# Patient Record
Sex: Male | Born: 1940
Health system: Southern US, Community
[De-identification: ages and names within clinical notes are randomized; demographics above are authoritative.]

## PROBLEM LIST (undated history)

## (undated) DIAGNOSIS — H269 Unspecified cataract: Secondary | ICD-10-CM

## (undated) DIAGNOSIS — K219 Gastro-esophageal reflux disease without esophagitis: Secondary | ICD-10-CM

## (undated) DIAGNOSIS — E538 Deficiency of other specified B group vitamins: Secondary | ICD-10-CM

## (undated) DIAGNOSIS — E785 Hyperlipidemia, unspecified: Secondary | ICD-10-CM

## (undated) DIAGNOSIS — R079 Chest pain, unspecified: Secondary | ICD-10-CM

## (undated) DIAGNOSIS — M1990 Unspecified osteoarthritis, unspecified site: Secondary | ICD-10-CM

## (undated) DIAGNOSIS — E7211 Homocystinuria: Secondary | ICD-10-CM

## (undated) DIAGNOSIS — D126 Benign neoplasm of colon, unspecified: Secondary | ICD-10-CM

## (undated) DIAGNOSIS — D649 Anemia, unspecified: Secondary | ICD-10-CM

## (undated) DIAGNOSIS — R7989 Other specified abnormal findings of blood chemistry: Secondary | ICD-10-CM

## (undated) DIAGNOSIS — K5790 Diverticulosis of intestine, part unspecified, without perforation or abscess without bleeding: Secondary | ICD-10-CM

## (undated) DIAGNOSIS — N402 Nodular prostate without lower urinary tract symptoms: Secondary | ICD-10-CM

## (undated) DIAGNOSIS — K2270 Barrett's esophagus without dysplasia: Secondary | ICD-10-CM

## (undated) DIAGNOSIS — R972 Elevated prostate specific antigen [PSA]: Secondary | ICD-10-CM

## (undated) DIAGNOSIS — F419 Anxiety disorder, unspecified: Secondary | ICD-10-CM

## (undated) DIAGNOSIS — R51 Headache: Secondary | ICD-10-CM

## (undated) DIAGNOSIS — Z87442 Personal history of urinary calculi: Secondary | ICD-10-CM

## (undated) DIAGNOSIS — R42 Dizziness and giddiness: Secondary | ICD-10-CM

## (undated) HISTORY — PX: COLONOSCOPY W/ POLYPECTOMY: SHX1380

## (undated) HISTORY — DX: Unspecified osteoarthritis, unspecified site: M19.90

## (undated) HISTORY — DX: Deficiency of other specified B group vitamins: E53.8

## (undated) HISTORY — DX: Barrett's esophagus without dysplasia: K22.70

## (undated) HISTORY — PX: CATARACT EXTRACTION, BILATERAL: SHX1313

## (undated) HISTORY — DX: Anxiety disorder, unspecified: F41.9

## (undated) HISTORY — DX: Hyperlipidemia, unspecified: E78.5

## (undated) HISTORY — DX: Personal history of urinary calculi: Z87.442

## (undated) HISTORY — DX: Nodular prostate without lower urinary tract symptoms: N40.2

## (undated) HISTORY — DX: Headache: R51

## (undated) HISTORY — DX: Diverticulosis of intestine, part unspecified, without perforation or abscess without bleeding: K57.90

## (undated) HISTORY — DX: Chest pain, unspecified: R07.9

## (undated) HISTORY — DX: Homocystinuria: E72.11

## (undated) HISTORY — DX: Unspecified cataract: H26.9

## (undated) HISTORY — DX: Benign neoplasm of colon, unspecified: D12.6

## (undated) HISTORY — DX: Elevated prostate specific antigen (PSA): R97.20

## (undated) HISTORY — DX: Gastro-esophageal reflux disease without esophagitis: K21.9

## (undated) HISTORY — DX: Other specified abnormal findings of blood chemistry: R79.89

## (undated) HISTORY — DX: Anemia, unspecified: D64.9

## (undated) HISTORY — DX: Dizziness and giddiness: R42

---

## 1998-06-10 ENCOUNTER — Emergency Department (HOSPITAL_COMMUNITY): Admission: EM | Admit: 1998-06-10 | Discharge: 1998-06-10 | Payer: Self-pay | Admitting: Emergency Medicine

## 1998-07-17 DIAGNOSIS — N402 Nodular prostate without lower urinary tract symptoms: Secondary | ICD-10-CM

## 1998-07-17 HISTORY — DX: Nodular prostate without lower urinary tract symptoms: N40.2

## 1998-07-17 HISTORY — PX: PROSTATE BIOPSY: SHX241

## 2002-11-22 ENCOUNTER — Encounter: Payer: Self-pay | Admitting: Emergency Medicine

## 2002-11-22 ENCOUNTER — Emergency Department (HOSPITAL_COMMUNITY): Admission: EM | Admit: 2002-11-22 | Discharge: 2002-11-22 | Payer: Self-pay | Admitting: Emergency Medicine

## 2003-12-23 ENCOUNTER — Encounter: Payer: Self-pay | Admitting: Internal Medicine

## 2004-04-05 ENCOUNTER — Encounter: Admission: RE | Admit: 2004-04-05 | Discharge: 2004-04-05 | Payer: Self-pay | Admitting: Internal Medicine

## 2004-07-17 HISTORY — PX: OTHER SURGICAL HISTORY: SHX169

## 2004-07-27 ENCOUNTER — Ambulatory Visit (HOSPITAL_BASED_OUTPATIENT_CLINIC_OR_DEPARTMENT_OTHER): Admission: RE | Admit: 2004-07-27 | Discharge: 2004-07-27 | Payer: Self-pay | Admitting: Surgery

## 2004-10-22 ENCOUNTER — Emergency Department (HOSPITAL_COMMUNITY): Admission: EM | Admit: 2004-10-22 | Discharge: 2004-10-22 | Payer: Self-pay | Admitting: Emergency Medicine

## 2004-12-20 ENCOUNTER — Ambulatory Visit: Payer: Self-pay | Admitting: Internal Medicine

## 2005-04-28 ENCOUNTER — Observation Stay (HOSPITAL_COMMUNITY): Admission: EM | Admit: 2005-04-28 | Discharge: 2005-04-29 | Payer: Self-pay | Admitting: Emergency Medicine

## 2005-04-29 ENCOUNTER — Ambulatory Visit: Payer: Self-pay | Admitting: Internal Medicine

## 2005-05-03 ENCOUNTER — Ambulatory Visit: Payer: Self-pay | Admitting: Internal Medicine

## 2005-05-17 ENCOUNTER — Ambulatory Visit: Payer: Self-pay | Admitting: Internal Medicine

## 2005-07-17 ENCOUNTER — Emergency Department (HOSPITAL_COMMUNITY): Admission: EM | Admit: 2005-07-17 | Discharge: 2005-07-17 | Payer: Self-pay | Admitting: Emergency Medicine

## 2005-07-17 HISTORY — PX: ROTATOR CUFF REPAIR: SHX139

## 2005-08-22 ENCOUNTER — Ambulatory Visit (HOSPITAL_COMMUNITY): Admission: RE | Admit: 2005-08-22 | Discharge: 2005-08-22 | Payer: Self-pay | Admitting: Specialist

## 2006-05-22 ENCOUNTER — Ambulatory Visit: Payer: Self-pay | Admitting: Internal Medicine

## 2006-05-22 LAB — CONVERTED CEMR LAB
ALT: 17 units/L (ref 0–40)
AST: 24 units/L (ref 0–37)
Albumin: 4.2 g/dL (ref 3.5–5.2)
Alkaline Phosphatase: 73 units/L (ref 39–117)
BUN: 11 mg/dL (ref 6–23)
Basophils Absolute: 0 10*3/uL (ref 0.0–0.1)
Basophils Relative: 0.6 % (ref 0.0–1.0)
CO2: 31 meq/L (ref 19–32)
Calcium: 9.5 mg/dL (ref 8.4–10.5)
Chloride: 106 meq/L (ref 96–112)
Chol/HDL Ratio, serum: 5.1
Cholesterol: 228 mg/dL (ref 0–200)
Creatinine, Ser: 1 mg/dL (ref 0.4–1.5)
Eosinophil percent: 3.6 % (ref 0.0–5.0)
GFR calc non Af Amer: 80 mL/min
Glomerular Filtration Rate, Af Am: 97 mL/min/{1.73_m2}
Glucose, Bld: 92 mg/dL (ref 70–99)
HCT: 44.1 % (ref 39.0–52.0)
HDL: 44.3 mg/dL (ref >39.0)
Hemoglobin: 14.9 g/dL (ref 13.0–17.0)
LDL DIRECT: 164.4 mg/dL
Lymphocytes Relative: 23.8 % (ref 12.0–46.0)
MCHC: 33.9 g/dL (ref 30.0–36.0)
MCV: 90.6 fL (ref 78.0–100.0)
Monocytes Absolute: 0.4 10*3/uL (ref 0.2–0.7)
Monocytes Relative: 8.8 % (ref 3.0–11.0)
Neutro Abs: 3 10*3/uL (ref 1.4–7.7)
Neutrophils Relative %: 63.2 % (ref 43.0–77.0)
PSA: 0.49 ng/mL (ref 0.10–4.00)
Platelets: 230 10*3/uL (ref 150–400)
Potassium: 4.2 meq/L (ref 3.5–5.1)
RBC: 4.87 M/uL (ref 4.22–5.81)
RDW: 12.4 % (ref 11.5–14.6)
Sodium: 142 meq/L (ref 135–145)
TSH: 1.04 microintl units/mL (ref 0.35–5.50)
Total Bilirubin: 1 mg/dL (ref 0.3–1.2)
Total Protein: 7.3 g/dL (ref 6.0–8.3)
Triglyceride fasting, serum: 95 mg/dL (ref 0–149)
VLDL: 19 mg/dL (ref 0–40)
WBC: 4.7 10*3/uL (ref 4.5–10.5)

## 2006-06-13 ENCOUNTER — Ambulatory Visit: Payer: Self-pay | Admitting: Gastroenterology

## 2006-06-16 DIAGNOSIS — D126 Benign neoplasm of colon, unspecified: Secondary | ICD-10-CM

## 2006-06-16 HISTORY — DX: Benign neoplasm of colon, unspecified: D12.6

## 2006-06-27 ENCOUNTER — Encounter (INDEPENDENT_AMBULATORY_CARE_PROVIDER_SITE_OTHER): Payer: Self-pay | Admitting: *Deleted

## 2006-06-27 ENCOUNTER — Encounter: Payer: Self-pay | Admitting: Internal Medicine

## 2006-06-27 ENCOUNTER — Ambulatory Visit: Payer: Self-pay | Admitting: Gastroenterology

## 2006-08-17 ENCOUNTER — Ambulatory Visit: Payer: Self-pay | Admitting: Internal Medicine

## 2006-08-17 LAB — CONVERTED CEMR LAB
ALT: 16 units/L (ref 0–40)
AST: 21 units/L (ref 0–37)
Cholesterol: 154 mg/dL (ref 0–200)
HDL: 45 mg/dL (ref >39.0)
LDL Cholesterol: 93 mg/dL (ref 0–99)
Total CHOL/HDL Ratio: 3.4
Triglycerides: 80 mg/dL (ref 0–149)
VLDL: 16 mg/dL (ref 0–40)

## 2007-01-14 ENCOUNTER — Ambulatory Visit: Payer: Self-pay | Admitting: Family Medicine

## 2007-01-14 ENCOUNTER — Telehealth (INDEPENDENT_AMBULATORY_CARE_PROVIDER_SITE_OTHER): Payer: Self-pay | Admitting: *Deleted

## 2007-01-15 LAB — CONVERTED CEMR LAB
Basophils Absolute: 0 10*3/uL (ref 0.0–0.1)
Basophils Relative: 0.6 % (ref 0.0–1.0)
Eosinophils Absolute: 0 10*3/uL (ref 0.0–0.6)
Eosinophils Relative: 0.4 % (ref 0.0–5.0)
HCT: 42.3 % (ref 39.0–52.0)
Hemoglobin: 14.3 g/dL (ref 13.0–17.0)
Lymphocytes Relative: 9.8 % — ABNORMAL LOW (ref 12.0–46.0)
MCHC: 33.9 g/dL (ref 30.0–36.0)
MCV: 90 fL (ref 78.0–100.0)
Monocytes Absolute: 0.3 10*3/uL (ref 0.2–0.7)
Monocytes Relative: 5.2 % (ref 3.0–11.0)
Neutro Abs: 5.6 10*3/uL (ref 1.4–7.7)
Neutrophils Relative %: 84 % — ABNORMAL HIGH (ref 43.0–77.0)
Platelets: 217 10*3/uL (ref 150–400)
RBC: 4.7 M/uL (ref 4.22–5.81)
RDW: 12.5 % (ref 11.5–14.6)
WBC: 6.5 10*3/uL (ref 4.5–10.5)

## 2007-05-27 ENCOUNTER — Ambulatory Visit: Payer: Self-pay | Admitting: Internal Medicine

## 2007-05-27 DIAGNOSIS — D126 Benign neoplasm of colon, unspecified: Secondary | ICD-10-CM

## 2007-05-27 DIAGNOSIS — E7210 Disorders of sulfur-bearing amino-acid metabolism, unspecified: Secondary | ICD-10-CM | POA: Insufficient documentation

## 2007-05-27 DIAGNOSIS — Z862 Personal history of diseases of the blood and blood-forming organs and certain disorders involving the immune mechanism: Secondary | ICD-10-CM

## 2007-06-05 ENCOUNTER — Encounter (INDEPENDENT_AMBULATORY_CARE_PROVIDER_SITE_OTHER): Payer: Self-pay | Admitting: *Deleted

## 2007-06-05 LAB — CONVERTED CEMR LAB
ALT: 17 units/L (ref 0–53)
AST: 27 units/L (ref 0–37)
Albumin: 4.1 g/dL (ref 3.5–5.2)
Alkaline Phosphatase: 68 units/L (ref 39–117)
BUN: 11 mg/dL (ref 6–23)
Basophils Absolute: 0.1 10*3/uL (ref 0.0–0.1)
Basophils Relative: 1.4 % — ABNORMAL HIGH (ref 0.0–1.0)
Bilirubin, Direct: 0.1 mg/dL (ref 0.0–0.3)
CO2: 28 meq/L (ref 19–32)
Calcium: 9.1 mg/dL (ref 8.4–10.5)
Chloride: 107 meq/L (ref 96–112)
Cholesterol: 191 mg/dL (ref 0–200)
Creatinine, Ser: 0.9 mg/dL (ref 0.4–1.5)
Eosinophils Absolute: 0.2 10*3/uL (ref 0.0–0.6)
Eosinophils Relative: 4.1 % (ref 0.0–5.0)
Folate: 19.2 ng/mL
GFR calc Af Amer: 109 mL/min
GFR calc non Af Amer: 90 mL/min
Glucose, Bld: 91 mg/dL (ref 70–99)
HCT: 40.4 % (ref 39.0–52.0)
HDL: 41 mg/dL (ref >39.0)
Hemoglobin: 14 g/dL (ref 13.0–17.0)
LDL Cholesterol: 130 mg/dL — ABNORMAL HIGH (ref 0–99)
Lymphocytes Relative: 24.6 % (ref 12.0–46.0)
MCHC: 34.6 g/dL (ref 30.0–36.0)
MCV: 91.1 fL (ref 78.0–100.0)
Monocytes Absolute: 0.3 10*3/uL (ref 0.2–0.7)
Monocytes Relative: 6.7 % (ref 3.0–11.0)
Neutro Abs: 2.8 10*3/uL (ref 1.4–7.7)
Neutrophils Relative %: 63.2 % (ref 43.0–77.0)
PSA: 0.43 ng/mL (ref 0.10–4.00)
Platelets: 233 10*3/uL (ref 150–400)
Potassium: 3.9 meq/L (ref 3.5–5.1)
RBC: 4.44 M/uL (ref 4.22–5.81)
RDW: 12.9 % (ref 11.5–14.6)
Sodium: 142 meq/L (ref 135–145)
TSH: 1.26 microintl units/mL (ref 0.35–5.50)
Total Bilirubin: 1 mg/dL (ref 0.3–1.2)
Total CHOL/HDL Ratio: 4.7
Total Protein: 6.9 g/dL (ref 6.0–8.3)
Triglycerides: 101 mg/dL (ref 0–149)
VLDL: 20 mg/dL (ref 0–40)
Vitamin B-12: 752 pg/mL (ref 211–911)
WBC: 4.5 10*3/uL (ref 4.5–10.5)

## 2007-08-02 ENCOUNTER — Ambulatory Visit: Payer: Self-pay | Admitting: Internal Medicine

## 2007-12-23 ENCOUNTER — Ambulatory Visit: Payer: Self-pay | Admitting: Internal Medicine

## 2007-12-30 LAB — CONVERTED CEMR LAB
Cholesterol: 210 mg/dL (ref 0–200)
Direct LDL: 153.3 mg/dL
HDL: 37.5 mg/dL — ABNORMAL LOW (ref >39.0)
Total CHOL/HDL Ratio: 5.6
Triglycerides: 92 mg/dL (ref 0–149)
VLDL: 18 mg/dL (ref 0–40)

## 2008-01-10 ENCOUNTER — Ambulatory Visit: Payer: Self-pay | Admitting: Internal Medicine

## 2008-01-10 DIAGNOSIS — L609 Nail disorder, unspecified: Secondary | ICD-10-CM | POA: Insufficient documentation

## 2008-01-10 DIAGNOSIS — F411 Generalized anxiety disorder: Secondary | ICD-10-CM

## 2008-01-10 LAB — CONVERTED CEMR LAB
Cholesterol, target level: 200 mg/dL
HDL goal, serum: 40 mg/dL
LDL Goal: 125 mg/dL

## 2008-01-20 ENCOUNTER — Encounter (INDEPENDENT_AMBULATORY_CARE_PROVIDER_SITE_OTHER): Payer: Self-pay | Admitting: *Deleted

## 2008-03-25 ENCOUNTER — Ambulatory Visit: Payer: Self-pay | Admitting: Internal Medicine

## 2008-04-01 LAB — CONVERTED CEMR LAB
ALT: 24 units/L (ref 0–53)
AST: 26 units/L (ref 0–37)
Albumin: 4.1 g/dL (ref 3.5–5.2)
Alkaline Phosphatase: 66 units/L (ref 39–117)
Bilirubin, Direct: 0.1 mg/dL (ref 0.0–0.3)
Cholesterol: 146 mg/dL (ref 0–200)
HDL: 36.2 mg/dL — ABNORMAL LOW (ref >39.0)
Hgb A1c MFr Bld: 5.5 % (ref 4.6–6.0)
LDL Cholesterol: 88 mg/dL (ref 0–99)
Total Bilirubin: 1 mg/dL (ref 0.3–1.2)
Total CHOL/HDL Ratio: 4
Total Protein: 7.1 g/dL (ref 6.0–8.3)
Triglycerides: 107 mg/dL (ref 0–149)
VLDL: 21 mg/dL (ref 0–40)
Vitamin B-12: 514 pg/mL (ref 211–911)

## 2008-04-02 ENCOUNTER — Encounter (INDEPENDENT_AMBULATORY_CARE_PROVIDER_SITE_OTHER): Payer: Self-pay | Admitting: *Deleted

## 2008-10-15 ENCOUNTER — Telehealth (INDEPENDENT_AMBULATORY_CARE_PROVIDER_SITE_OTHER): Payer: Self-pay | Admitting: *Deleted

## 2008-10-30 ENCOUNTER — Ambulatory Visit: Payer: Self-pay | Admitting: Internal Medicine

## 2008-10-30 DIAGNOSIS — R269 Unspecified abnormalities of gait and mobility: Secondary | ICD-10-CM | POA: Insufficient documentation

## 2008-11-05 ENCOUNTER — Ambulatory Visit: Payer: Self-pay | Admitting: Internal Medicine

## 2008-11-07 LAB — CONVERTED CEMR LAB
ALT: 20 units/L (ref 0–53)
AST: 24 units/L (ref 0–37)
Albumin: 4.2 g/dL (ref 3.5–5.2)
Alkaline Phosphatase: 67 units/L (ref 39–117)
BUN: 11 mg/dL (ref 6–23)
Basophils Absolute: 0.1 10*3/uL (ref 0.0–0.1)
Basophils Relative: 1.2 % (ref 0.0–3.0)
Bilirubin, Direct: 0.1 mg/dL (ref 0.0–0.3)
CO2: 27 meq/L (ref 19–32)
Calcium: 9.3 mg/dL (ref 8.4–10.5)
Chloride: 109 meq/L (ref 96–112)
Cholesterol: 238 mg/dL — ABNORMAL HIGH (ref 0–200)
Creatinine, Ser: 0.9 mg/dL (ref 0.4–1.5)
Direct LDL: 157.8 mg/dL
Eosinophils Absolute: 0.2 10*3/uL (ref 0.0–0.7)
Eosinophils Relative: 4.7 % (ref 0.0–5.0)
Folate: 15.2 ng/mL
GFR calc non Af Amer: 89.37 mL/min (ref >60)
Glucose, Bld: 103 mg/dL — ABNORMAL HIGH (ref 70–99)
HCT: 42.4 % (ref 39.0–52.0)
HDL: 37.9 mg/dL — ABNORMAL LOW (ref >39.00)
Hemoglobin: 14.5 g/dL (ref 13.0–17.0)
Lymphocytes Relative: 25 % (ref 12.0–46.0)
Lymphs Abs: 1.2 10*3/uL (ref 0.7–4.0)
MCHC: 34.1 g/dL (ref 30.0–36.0)
MCV: 90.5 fL (ref 78.0–100.0)
Monocytes Absolute: 0.4 10*3/uL (ref 0.1–1.0)
Monocytes Relative: 7.8 % (ref 3.0–12.0)
Neutro Abs: 3.1 10*3/uL (ref 1.4–7.7)
Neutrophils Relative %: 61.3 % (ref 43.0–77.0)
PSA: 0.53 ng/mL (ref 0.10–4.00)
Platelets: 219 10*3/uL (ref 150.0–400.0)
Potassium: 4.1 meq/L (ref 3.5–5.1)
RBC: 4.69 M/uL (ref 4.22–5.81)
RDW: 12.2 % (ref 11.5–14.6)
Sodium: 141 meq/L (ref 135–145)
TSH: 0.77 microintl units/mL (ref 0.35–5.50)
Total Bilirubin: 0.8 mg/dL (ref 0.3–1.2)
Total CHOL/HDL Ratio: 6
Total Protein: 7.2 g/dL (ref 6.0–8.3)
Triglycerides: 115 mg/dL (ref 0.0–149.0)
VLDL: 23 mg/dL (ref 0.0–40.0)
Vitamin B-12: 414 pg/mL (ref 211–911)
WBC: 5 10*3/uL (ref 4.5–10.5)

## 2008-11-09 ENCOUNTER — Encounter (INDEPENDENT_AMBULATORY_CARE_PROVIDER_SITE_OTHER): Payer: Self-pay | Admitting: *Deleted

## 2009-02-10 ENCOUNTER — Ambulatory Visit: Payer: Self-pay | Admitting: Internal Medicine

## 2009-02-17 LAB — CONVERTED CEMR LAB
ALT: 19 units/L (ref 0–53)
AST: 26 units/L (ref 0–37)
Cholesterol: 170 mg/dL (ref 0–200)
HDL: 40.6 mg/dL (ref >39.00)
LDL Cholesterol: 110 mg/dL — ABNORMAL HIGH (ref 0–99)
Total CHOL/HDL Ratio: 4
Triglycerides: 97 mg/dL (ref 0.0–149.0)
VLDL: 19.4 mg/dL (ref 0.0–40.0)

## 2009-02-18 ENCOUNTER — Encounter (INDEPENDENT_AMBULATORY_CARE_PROVIDER_SITE_OTHER): Payer: Self-pay | Admitting: *Deleted

## 2009-03-16 ENCOUNTER — Telehealth (INDEPENDENT_AMBULATORY_CARE_PROVIDER_SITE_OTHER): Payer: Self-pay | Admitting: *Deleted

## 2009-03-18 ENCOUNTER — Ambulatory Visit: Payer: Self-pay | Admitting: Internal Medicine

## 2009-03-18 DIAGNOSIS — L259 Unspecified contact dermatitis, unspecified cause: Secondary | ICD-10-CM

## 2009-04-26 ENCOUNTER — Telehealth (INDEPENDENT_AMBULATORY_CARE_PROVIDER_SITE_OTHER): Payer: Self-pay | Admitting: *Deleted

## 2009-04-27 ENCOUNTER — Ambulatory Visit: Payer: Self-pay | Admitting: Internal Medicine

## 2009-04-29 ENCOUNTER — Encounter (INDEPENDENT_AMBULATORY_CARE_PROVIDER_SITE_OTHER): Payer: Self-pay | Admitting: *Deleted

## 2009-04-29 LAB — CONVERTED CEMR LAB: Hgb A1c MFr Bld: 5.6 % (ref 4.6–6.5)

## 2009-05-31 ENCOUNTER — Ambulatory Visit: Payer: Self-pay | Admitting: Internal Medicine

## 2009-05-31 DIAGNOSIS — R51 Headache: Secondary | ICD-10-CM | POA: Insufficient documentation

## 2009-05-31 DIAGNOSIS — R519 Headache, unspecified: Secondary | ICD-10-CM | POA: Insufficient documentation

## 2009-05-31 DIAGNOSIS — M5412 Radiculopathy, cervical region: Secondary | ICD-10-CM | POA: Insufficient documentation

## 2009-06-01 ENCOUNTER — Telehealth (INDEPENDENT_AMBULATORY_CARE_PROVIDER_SITE_OTHER): Payer: Self-pay | Admitting: *Deleted

## 2009-08-09 ENCOUNTER — Telehealth (INDEPENDENT_AMBULATORY_CARE_PROVIDER_SITE_OTHER): Payer: Self-pay | Admitting: *Deleted

## 2010-06-14 ENCOUNTER — Encounter: Payer: Self-pay | Admitting: Internal Medicine

## 2010-06-14 ENCOUNTER — Emergency Department (HOSPITAL_COMMUNITY)
Admission: EM | Admit: 2010-06-14 | Discharge: 2010-06-14 | Payer: Self-pay | Source: Home / Self Care | Admitting: Emergency Medicine

## 2010-06-22 ENCOUNTER — Ambulatory Visit: Payer: Self-pay | Admitting: Internal Medicine

## 2010-06-22 DIAGNOSIS — K219 Gastro-esophageal reflux disease without esophagitis: Secondary | ICD-10-CM | POA: Insufficient documentation

## 2010-06-22 DIAGNOSIS — E785 Hyperlipidemia, unspecified: Secondary | ICD-10-CM

## 2010-06-22 DIAGNOSIS — L0390 Cellulitis, unspecified: Secondary | ICD-10-CM

## 2010-06-22 DIAGNOSIS — L0291 Cutaneous abscess, unspecified: Secondary | ICD-10-CM | POA: Insufficient documentation

## 2010-06-22 LAB — CONVERTED CEMR LAB: LDL Goal: 130 mg/dL

## 2010-06-24 LAB — CONVERTED CEMR LAB
ALT: 17 units/L (ref 0–53)
AST: 22 units/L (ref 0–37)
Albumin: 4.4 g/dL (ref 3.5–5.2)
Alkaline Phosphatase: 79 units/L (ref 39–117)
BUN: 16 mg/dL (ref 6–23)
Basophils Absolute: 0.1 10*3/uL (ref 0.0–0.1)
Basophils Relative: 1.1 % (ref 0.0–3.0)
Bilirubin, Direct: 0.2 mg/dL (ref 0.0–0.3)
CO2: 27 meq/L (ref 19–32)
Calcium: 9.1 mg/dL (ref 8.4–10.5)
Chloride: 104 meq/L (ref 96–112)
Cholesterol: 238 mg/dL — ABNORMAL HIGH (ref 0–200)
Creatinine, Ser: 0.9 mg/dL (ref 0.4–1.5)
Direct LDL: 157.2 mg/dL
Eosinophils Absolute: 0.1 10*3/uL (ref 0.0–0.7)
Eosinophils Relative: 2.1 % (ref 0.0–5.0)
GFR calc non Af Amer: 95.01 mL/min (ref >60.00)
Glucose, Bld: 92 mg/dL (ref 70–99)
HCT: 41.3 % (ref 39.0–52.0)
HDL: 45.9 mg/dL (ref >39.00)
Hemoglobin: 14 g/dL (ref 13.0–17.0)
Lymphocytes Relative: 21.8 % (ref 12.0–46.0)
Lymphs Abs: 1.1 10*3/uL (ref 0.7–4.0)
MCHC: 34 g/dL (ref 30.0–36.0)
MCV: 91.2 fL (ref 78.0–100.0)
Monocytes Absolute: 0.3 10*3/uL (ref 0.1–1.0)
Monocytes Relative: 6.6 % (ref 3.0–12.0)
Neutro Abs: 3.4 10*3/uL (ref 1.4–7.7)
Neutrophils Relative %: 68.4 % (ref 43.0–77.0)
PSA: 0.52 ng/mL (ref 0.10–4.00)
Platelets: 232 10*3/uL (ref 150.0–400.0)
Potassium: 4 meq/L (ref 3.5–5.1)
RBC: 4.53 M/uL (ref 4.22–5.81)
RDW: 13.8 % (ref 11.5–14.6)
Sodium: 140 meq/L (ref 135–145)
TSH: 1.42 microintl units/mL (ref 0.35–5.50)
Total Bilirubin: 1.2 mg/dL (ref 0.3–1.2)
Total CHOL/HDL Ratio: 5
Total Protein: 7.1 g/dL (ref 6.0–8.3)
Triglycerides: 122 mg/dL (ref 0.0–149.0)
VLDL: 24.4 mg/dL (ref 0.0–40.0)
Vitamin B-12: 410 pg/mL (ref 211–911)
WBC: 5 10*3/uL (ref 4.5–10.5)

## 2010-06-27 ENCOUNTER — Ambulatory Visit: Payer: Self-pay | Admitting: Internal Medicine

## 2010-06-27 DIAGNOSIS — R1031 Right lower quadrant pain: Secondary | ICD-10-CM | POA: Insufficient documentation

## 2010-06-27 DIAGNOSIS — K429 Umbilical hernia without obstruction or gangrene: Secondary | ICD-10-CM | POA: Insufficient documentation

## 2010-06-27 LAB — CONVERTED CEMR LAB
Bilirubin Urine: NEGATIVE
Glucose, Urine, Semiquant: NEGATIVE
Ketones, urine, test strip: NEGATIVE
Nitrite: NEGATIVE
Protein, U semiquant: NEGATIVE
Specific Gravity, Urine: 1.01
Urobilinogen, UA: 0.2
WBC Urine, dipstick: NEGATIVE
pH: 6

## 2010-06-28 ENCOUNTER — Ambulatory Visit: Payer: Self-pay | Admitting: Internal Medicine

## 2010-06-28 ENCOUNTER — Encounter: Payer: Self-pay | Admitting: Internal Medicine

## 2010-08-16 NOTE — Progress Notes (Signed)
Summary: REFILL  Phone Note Refill Request Message from:  Fax from Pharmacy on [facility] [address] FAX [phone]  TRAMADOL HCL 50MG   Initial call taken by: Barb Merino,  August 09, 2009 9:37 AM    Prescriptions: TRAMADOL HCL 50 MG TABS (TRAMADOL HCL) q 6 hrs as needed #30(not allergic to codeine) **Darvocet off the market**  #30 x 1   Entered by:   Shonna Chock   Authorized by:   Marga Melnick MD   Signed by:   Shonna Chock on 08/09/2009   Method used:   Electronically to        [facility] [phone]* (retail)       [address]       [place], [place]  [address]       Ph: [phone]       Fax: [phone]   RxID:   [id number]

## 2010-08-16 NOTE — Assessment & Plan Note (Signed)
Summary: cpx patient fasting/nta   Vital Signs:  Patient profile:   70 year old male Height:      69 inches Weight:      203.6 pounds BMI:     30.18 Temp:     98.7 degrees F Pulse rate:   72 / minute Resp:     14 per minute BP sitting:   130 / 74  Primary Care Provider:  Laury Axon  CC:  Heartburn.  History of Present Illness:     Adrian Burke is here for a physical; he is still employed & does not use Medicare.He was seen @ [facility] 06/14/2010 for chest pain ; cardiac enzymes & EKG were normal. PPI was Rxed; no recurrence.  The patient  now denies sour taste in mouth, epigastric pain, chest pain, trouble swallowing, and weight loss.  The patient denies the following alarm features: melena, dysphagia, hematemesis, and vomiting.  Symptoms  11/29 occurred  with lying down  after eating late , 9:30- 10 pm.  Lipid Management History:      Positive NCEP/ATP III risk factors include male age 41 years old or older.  Negative NCEP/ATP III risk factors include non-diabetic, no family history for ischemic heart disease, non-tobacco-user status, non-hypertensive, no ASHD (atherosclerotic heart disease), no prior stroke/TIA, no peripheral vascular disease, and no history of aortic aneurysm.     Allergies: 1)  ! Ceftin 2)  ! * Smz/tmp  Past History:  Past Medical History: B 12 deficiency , on monthly injections ; spinal tap as OP for headaches ; chest pain 2006 & 2011(neg nuclear stess test 2004) , MI R/Oed; PMH prostate nodule 2000, resolved w/o treatment  ; elevated PSA 2000 due to prostatitis Hyperlipidemia:  NMR Lipoprofile 2005: LDL 425(9563/875), HDL 38, TG 87. LDL goal = < 130.[place] Study LDL goal = < 160. Elevated Homocysteine Colonic polyps, PMH  of, Tubular adenoma   Past Surgical History: Rectal fistula repair 2005 Rotator cuff repair 2007,Dr Paula Libra Colon polypectomy 06/2006, Dr Jarold Motto Prostate biopsy 2000, Dr Annabell Howells  Family History: Father: health history unknown Mother:  cancer  of ? primary Siblings: negative Maternal aunt: brain cancer; no FH of  MI ,DM or CVA  Social History: no diet Occupation: Social worker for [employer] Married Never Smoked Alcohol use-no Regular exercise: irregularly  Review of Systems  The patient denies anorexia, fever, vision loss, decreased hearing, hoarseness, syncope, dyspnea on exertion, prolonged cough, hemoptysis, hematuria, depression, unusual weight change, abnormal bleeding, enlarged lymph nodes, and angioedema.         Edema post prolonged  periods on feet  Physical Exam  General:  well-nourished,in no acute distress; alert,appropriate and cooperative throughout examination Head:  Normocephalic and atraumatic without obvious abnormalities.  Eyes:  No corneal or conjunctival inflammation noted. Perrla. Funduscopic exam benign, without hemorrhages, exudates or papilledema.No icterus Ears:  External ear exam shows no significant lesions or deformities.  Otoscopic examination reveals clear canals, tympanic membranes are intact bilaterally without bulging, retraction, inflammation or discharge. Hearing is grossly normal bilaterally. Nose:  External nasal examination shows no deformity or inflammation. Nasal mucosa are pink and moist without lesions or exudates. Mouth:  Oral mucosa and oropharynx without lesions or exudates.  Teeth in good repair. no pharyngeal erythema.   Neck:  No deformities, masses, or tenderness noted. Lungs:  Normal respiratory effort, chest expands symmetrically. Lungs are clear to auscultation, no crackles or wheezes. Heart:  Normal rate and regular rhythm. S1 and S2 normal without gallop, murmur,  click, rub . S4 Abdomen:  Bowel sounds positive,abdomen soft and non-tender without masses, organomegaly or hernias noted. Umbilicus mildly erythematous & slightly tender Rectal:  No external abnormalities noted. Normal sphincter tone. No rectal masses or tenderness. Genitalia:  Testes bilaterally  descended without nodularity, tenderness or masses. No scrotal masses or lesions. No penis lesions or urethral discharge. Prostate:  Prostate gland firm and smooth, no enlargement, nodularity, tenderness, mass, asymmetry or induration. Msk:  No deformity or scoliosis noted of thoracic or lumbar spine.   Pulses:  R and L carotid,radia,dorsalis pedis and posterior tibial pulses are full and equal bilaterally Extremities:  No clubbing, cyanosis, edema, or deformity noted with normal full range of motion of all joints.  Mild crepitus of knees Neurologic:  alert & oriented X3 and DTRs symmetrical and normal.   Skin:  Intact without suspicious lesions or rashes Cervical Nodes:  No lymphadenopathy noted Axillary Nodes:  No palpable lymphadenopathy Inguinal Nodes:  No significant adenopathy Psych:  memory intact for recent and remote, normally interactive, and good eye contact.     Impression & Recommendations:  Problem # 1:  ROUTINE GENERAL MEDICAL EXAM@HEALTH  CARE FACL (ICD-V70.0)  Orders: Venipuncture (16109) TLB-Lipid Panel (80061-LIPID) TLB-BMP (Basic Metabolic Panel-BMET) (80048-METABOL) TLB-CBC Platelet - w/Differential (85025-CBCD) TLB-Hepatic/Liver Function Pnl (80076-HEPATIC) TLB-TSH (Thyroid Stimulating Hormone) (84443-TSH) TLB-B12, Serum-Total ONLY (60454-U98) TLB-PSA (Prostate Specific Antigen) (84153-PSA)  Problem # 2:  GERD (ICD-530.81) with chest pain, resolved with PPI  Problem # 3:  CELLULITIS (ICD-682.9) @ umbilicus   Problem # 4:  HYPERLIPIDEMIA (ICD-272.4)  Problem # 5:  ADENOMATOUS COLONIC POLYP (ICD-211.3)  Orders: Gastroenterology Referral (GI)  Problem # 6:  ANEMIA, PERNICIOUS, HX OF (ICD-V12.3)  Complete Medication List: 1)  Folic Acid 1 Mg Tabs (Folic acid) .... Take one tablet daily 2)  Ecotrin 81mg  On Hold  3)  Tylenol Arthritis Pain 650 Mg Cr-tabs (Acetaminophen) .[name].. 1 by mouth once daily as needed 4)  Cyanocobalamin 1000 Mcg/ml Inj Soln  (Cyanocobalamin) .[name].. 1cc im q month 5)  Mucinex Prn  6)  Delsym Prn  7)  Multivitamin For Cholesterol  .... Once daily 8)  23 Gauge Syring For B12 Inj  .... As directed 9)  Fluoxetine Hcl 10 Mg Caps (Fluoxetine hcl) .[name].. 1 once daily 10)  Hydroxyzine Pamoate 25 Mg Caps (Hydroxyzine pamoate) .[name].. 1-2 q 6 hrs as needed 11)  Propoxyphene N-apap 100-650 Mg Tabs (Propoxyphene n-apap) .[name].. 1 q 6 hrs as needed pain 12)  Cyclobenzaprine Hcl 5 Mg Tabs (Cyclobenzaprine hcl) .[name].. 1-2 at bedtime as needed for muscle spasm 13)  Tramadol Hcl 50 Mg Tabs (Tramadol hcl) .... Q 6 hrs as needed #30(not allergic to codeine) **darvocet off the market** 14)  Bactroban 2 % Oint (Mupirocin) .... Apply once daily - two times a day after cleaning umbilicus  Lipid Assessment/Plan:      Based on NCEP/ATP III, the patient's risk factor category is "0-1 risk factors".  The patient's lipid goals are as follows: Total cholesterol goal is 200; LDL cholesterol goal is 130; HDL cholesterol goal is 40; Triglyceride goal is 150.  His LDL cholesterol goal has not been met.  Secondary causes for hyperlipidemia have been ruled out.  He has been counseled on adjunctive measures for lowering his cholesterol and has been provided with dietary instructions.    Patient Instructions: 1)  Consider Surgical referral if cellulitis fails to resolve with ointment . 2)  Avoid foods high in acid (tomatoes, citrus juices, spicy foods). Avoid eating within  two hours of lying down or before exercising. Do not over eat; try smaller more frequent meals. Elevate head of bed twelve inches when sleeping. Prescriptions: BACTROBAN 2 % OINT (MUPIROCIN) apply once daily - two times a day after cleaning umbilicus  #15 grams x 0   Entered and Authorized by:   Marga Melnick MD   Signed by:   Marga Melnick MD on 06/22/2010   Method used:   Print then Give to Patient   RxID:   [id number]    Orders Added: 1)  Est. Patient 65& > [99397] 2)   Venipuncture [14782] 3)  TLB-Lipid Panel [80061-LIPID] 4)  TLB-BMP (Basic Metabolic Panel-BMET) [80048-METABOL] 5)  TLB-CBC Platelet - w/Differential [85025-CBCD] 6)  TLB-Hepatic/Liver Function Pnl [80076-HEPATIC] 7)  TLB-TSH (Thyroid Stimulating Hormone) [84443-TSH] 8)  TLB-B12, Serum-Total ONLY [82607-B12] 9)  TLB-PSA (Prostate Specific Antigen) [95621-HYQ] 10)  Gastroenterology Referral [GI]     Appended Document: cpx patient fasting/nta

## 2010-08-18 NOTE — Assessment & Plan Note (Signed)
Summary: Sharpe pain below belly button/scm   Vital Signs:  Patient profile:   70 year old male Weight:      205.2 pounds BMI:     30.41 Temp:     98.2 degrees F oral Pulse rate:   60 / minute Resp:     16 per minute BP sitting:   122 / 80  (left arm) Cuff size:   large  Vitals Entered By: Shonna Chock CMA (June 27, 2010 4:44 PM) CC: Lower abdominal pain-worse on right side, very tender to the touch , Abdominal pain   Primary Care Provider:  Laury Axon  CC:  Lower abdominal pain-worse on right side, very tender to the touch , and Abdominal pain.  History of Present Illness: Abdominal Pain      This is a Adrian Burke who presents with Abdominal pain as of 12/11.  The patient denies nausea, vomiting, diarrhea, constipation, melena, hematochezia, anorexia, and hematemesis.  The location of the pain is right lower quadrant.  The pain is described as constant  and dull; it is  sharp with pressure to the umbilical area.It even affected his sleep if in RLDDP. He had been cleaning the umbilicus & applying Bactroban w/o pain until 12/11 for possible low grade cellulitis of umbilicus. The umbilicus changes responded with decreased extrusion  & less erythema until 12/11.  The patient denies the following symptoms: fever, weight loss, dysuria, jaundice, and dark urine.  No PMH of appendectomy.PMH of Nephrolithiasis.  Allergies: 1)  ! Ceftin 2)  ! * Smz/tmp  Review of Systems General:  Denies chills and sweats. GU:  Denies discharge and hematuria.  Physical Exam  General:  Uncomfortable but in no acute distress; alert,appropriate and cooperative throughout examination Eyes:  No corneal or conjunctival inflammation noted.No icterus Mouth:  Oral mucosa and oropharynx without lesions or exudates.  No pharyngeal erythema.   Lungs:  Normal respiratory effort, chest expands symmetrically. Lungs are clear to auscultation, no crackles or wheezes. Heart:  regular rhythm, no murmur, no gallop, no  rub, no JVD, no HJR, and bradycardia.   Abdomen:  Bowel sounds positive,abdomen soft  but slightly tender  RLQ without masses, organomegaly . Umbilical hernia suggested  w/o suggestion of cellulitis Skin:  Intact without suspicious lesions or rashes. No jaundice  Cervical Nodes:  No lymphadenopathy noted Axillary Nodes:  No palpable lymphadenopathy Psych:  memory intact for recent and remote, normally interactive, and good eye contact.     Impression & Recommendations:  Problem # 1:  ABDOMINAL PAIN, RIGHT LOWER QUADRANT (ICD-789.03)  Orders: Radiology Referral (Radiology)  Problem # 2:  UMBILICAL HERNIA (ICD-553.1)  R/O cellulitis , abscess  Orders: Radiology Referral (Radiology)  Complete Medication List: 1)  Folic Acid 1 Mg Tabs (Folic acid) .... Take one tablet daily 2)  Ecotrin 81mg  On Hold  3)  Tylenol Arthritis Pain 650 Mg Cr-tabs (Acetaminophen) .[name].. 1 by mouth once daily as needed 4)  Cyanocobalamin 1000 Mcg/ml Inj Soln (Cyanocobalamin) .[name].. 1cc im q month 5)  Mucinex Prn  6)  Delsym Prn  7)  Multivitamin For Cholesterol  .... Once daily 8)  23 Gauge Syring For B12 Inj  .... As directed 9)  Fluoxetine Hcl 10 Mg Caps (Fluoxetine hcl) .[name].. 1 once daily 10)  Hydroxyzine Pamoate 25 Mg Caps (Hydroxyzine pamoate) .[name].. 1-2 q 6 hrs as needed 11)  Propoxyphene N-apap 100-650 Mg Tabs (Propoxyphene n-apap) .[name].. 1 q 6 hrs as needed pain 12)  Cyclobenzaprine Hcl 5  Mg Tabs (Cyclobenzaprine hcl) .[name].. 1-2 at bedtime as needed for muscle spasm 13)  Tramadol Hcl 50 Mg Tabs (Tramadol hcl) .... Q 6 hrs as needed #30(not allergic to codeine) **darvocet off the market** 14)  Bactroban 2 % Oint (Mupirocin) .... Apply once daily - two times a day after cleaning umbilicus 15)  Acetaminophen-codeine #3 300-30 Mg Tabs (Acetaminophen-codeine) .[name].. 1-2 every 4 -6 hrs as needed  Other Orders: UA Dipstick w/o Micro (manual) (10272) Specimen Handling (99000) T-Culture, Urine  (53664-40347)  Patient Instructions: 1)  Drink clear liquids only for the next 24 hours, then slowly add other liquids and food as you  tolerate them. To ER if pain progresses with this note. Prescriptions: ACETAMINOPHEN-CODEINE #3 300-30 MG TABS (ACETAMINOPHEN-CODEINE) 1-2 every 4 -6 hrs as needed  #30 x 0   Entered and Authorized by:   Marga Melnick MD   Signed by:   Marga Melnick MD on 06/27/2010   Method used:   Print then Give to Patient   RxID:   [id number]    Orders Added: 1)  UA Dipstick w/o Micro (manual) [81002] 2)  Specimen Handling [99000] 3)  T-Culture, Urine [51884-16606] 4)  Est. Patient Level IV [30160] 5)  Radiology Referral [Radiology]    Laboratory Results   Urine Tests    Routine Urinalysis   Color: lt. yellow Appearance: Clear Glucose: negative   (Normal Range: Negative) Bilirubin: negative   (Normal Range: Negative) Ketone: negative   (Normal Range: Negative) Spec. Gravity: 1.010   (Normal Range: 1.003-1.035) Blood: trace-lysed   (Normal Range: Negative) pH: 6.0   (Normal Range: 5.0-8.0) Protein: negative   (Normal Range: Negative) Urobilinogen: 0.2   (Normal Range: 0-1) Nitrite: negative   (Normal Range: Negative) Leukocyte Esterace: negative   (Normal Range: Negative)

## 2010-08-29 ENCOUNTER — Encounter: Payer: Self-pay | Admitting: Internal Medicine

## 2010-09-07 NOTE — Letter (Signed)
Summary: Request for Surgical Clearance  Request for Surgical Clearance   Imported By: Maryln Gottron 08/31/2010 11:32:58  _____________________________________________________________________  External Attachment:    Type:   Image     Comment:   External Document

## 2010-09-19 ENCOUNTER — Other Ambulatory Visit: Payer: Self-pay | Admitting: Specialist

## 2010-09-19 ENCOUNTER — Encounter (HOSPITAL_COMMUNITY): Payer: Managed Care, Other (non HMO) | Attending: Specialist

## 2010-09-19 DIAGNOSIS — Z01812 Encounter for preprocedural laboratory examination: Secondary | ICD-10-CM | POA: Insufficient documentation

## 2010-09-19 LAB — SURGICAL PCR SCREEN
MRSA, PCR: NEGATIVE
Staphylococcus aureus: NEGATIVE

## 2010-09-22 ENCOUNTER — Ambulatory Visit (HOSPITAL_COMMUNITY)
Admission: RE | Admit: 2010-09-22 | Discharge: 2010-09-22 | Disposition: A | Discharge status: Home / Self Care | Payer: Managed Care, Other (non HMO) | Source: Ambulatory Visit | Attending: Specialist | Admitting: Specialist

## 2010-09-22 DIAGNOSIS — IMO0002 Reserved for concepts with insufficient information to code with codable children: Secondary | ICD-10-CM | POA: Insufficient documentation

## 2010-09-22 DIAGNOSIS — X58XXXA Exposure to other specified factors, initial encounter: Secondary | ICD-10-CM | POA: Insufficient documentation

## 2010-09-22 DIAGNOSIS — M9420 Chondromalacia, unspecified site: Secondary | ICD-10-CM | POA: Insufficient documentation

## 2010-09-22 DIAGNOSIS — S83289A Other tear of lateral meniscus, current injury, unspecified knee, initial encounter: Secondary | ICD-10-CM | POA: Insufficient documentation

## 2010-09-22 HISTORY — PX: KNEE ARTHROSCOPY: SUR90

## 2010-09-23 NOTE — Op Note (Signed)
  NAME:  Adrian Burke, Adrian Burke NO.:  [id number]  MEDICAL RECORD NO.:  [id number]           PATIENT TYPE:  O  LOCATION:  PADM                         FACILITY:  [facility]  PHYSICIAN:  Jene Every, M.D.    DATE OF BIRTH:  1941-03-11  DATE OF PROCEDURE:  09/22/2010 DATE OF DISCHARGE:                              OPERATIVE REPORT   PREOPERATIVE DIAGNOSIS:  Medial meniscus tear of the left knee.  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  PROCEDURES PERFORMED: 1. Left knee arthroscopy. 2. Partial medial meniscectomy. 3. Partial lateral meniscectomy. 4. Chondroplasty patellofemoral joint in the medial compartment.  BRIEF HISTORY:  A 70 year old male with knee pain, locking, giving way mechanical symptoms.  MRI indicating medial and lateral meniscus tears. Due to mechanical symptoms and persistent pain is indicated for arthroscopic debridement, partial meniscectomy.  Risks and benefits discussed including bleeding, infection, damage to bowel structures, no change in symptoms, worsening symptoms, need for repeat debridement, DVT, PE, anesthetic complications, etc.  TECHNIQUE:  With the patient in supine position after adequate anesthesia and 2 g Kefzol, left lower extremity was prepped and draped in the usual sterile fashion.  A lateral parapatellar portal and superomedial parapatellar portal was fashioned with a #11 blade. Ingress cannula atraumatically placed.  Irrigant was utilized to insufflate the joint.  Under direct visualization, medial parapatellar portal was fashioned with a #11 blade after localization with 18-gauge needle sparing the medial meniscus.  There was extensive complex tearing of the medial meniscus.  The meniscus was displaced into the tibial femoral compartment.  I introduced a basket and performed a partial medial meniscectomy to a stable base,  approximately 50% of posterior half had to be excised.  This is further contoured with a 0.352 shaver to a stable base.  A light chondroplasty of the femoral condyle and tibial plateau were performed as well.  ACL revealed some degenerative fraying, otherwise unremarkable.  Lateral compartment revealed extensive radial tearing and complex tearing under portion of the lateral meniscus.  It was fairly tight, obtaining access into the lateral compartment.  We debrided this to a stable base, approximately 20% of the anterior portion of the meniscus from the middle third was removed.  The remnant was stable to probe palpation.  Chondral surfaces were otherwise unremarkable there.  Suprapatellar pouch revealed normal patellofemoral tracking, chondromalacia patellofemoral joint.  Normal patellofemoral tracking. Synovectomy performed.  Gutters were unremarkable.  Reexamined all compartments.  No further pathology amenable arthroscopic intervention, I therefore removed all instrumentation and portals were closed with 4-0 nylon simple sutures.  Marcaine 0.25% with epinephrine was infiltrated in the joint.  Wound was dressed sterilely.  Awoken without difficulty and transported to the recovery room in satisfactory condition.  The patient tolerated the procedure well.  No complications.  Assistant none.     Jene Every, M.D.     Cordelia Pen  D:  09/22/2010  T:  09/23/2010  Job:  [id number]  Electronically Signed by Jene Every M.D. on 09/23/2010 05:50:20 AM

## 2010-09-27 LAB — POCT CARDIAC MARKERS
CKMB, poc: 1.2 ng/mL (ref 1.0–8.0)
Myoglobin, poc: 103 ng/mL (ref 12–200)
Troponin i, poc: <0.05 ng/mL (ref 0.00–0.09)

## 2010-10-05 ENCOUNTER — Encounter: Payer: Self-pay | Admitting: Internal Medicine

## 2010-10-05 ENCOUNTER — Ambulatory Visit (INDEPENDENT_AMBULATORY_CARE_PROVIDER_SITE_OTHER): Payer: Managed Care, Other (non HMO) | Admitting: Internal Medicine

## 2010-10-05 VITALS — BP 110/68 | HR 76 | Temp 98.6°F | Wt 202.0 lb

## 2010-10-05 DIAGNOSIS — K429 Umbilical hernia without obstruction or gangrene: Secondary | ICD-10-CM

## 2010-10-05 NOTE — Progress Notes (Signed)
  Subjective:    Patient ID: Adrian Burke, male    DOB: Oct 16, 1940, 70 y.o.   MRN: [id number]  HPI  He has had an exacerbation of pain related to his previously documented umbilical hernia as of 3/17/ 2012. This was in the context of exercises prescribed following arthroscopy of the left knee 09/22/2010. The pain is described as sharp in the right periumbilical area, worse with movement. The narcotic pain medication prescribed for the knee has helped this pain.      Review of Systems   He denies fever, chills, sweats, nausea, vomiting, constipation, diarrhea, rectal bleeding, or melena. He has lost approximately 8 Holmes in this perioperative period.   He denies dysuria, hematuria or pyuria.     Objective:   Physical Exam  He is in no physical distress. He has no scleral icterus or jaundice. Auscultation of the chest reveals normal breath sounds with no increased work of breathing. Heart rhythm is regular with no significant murmurs or gallops   Examination of the abdomen reveals normal bowel sounds. There is distention and mild color change at the umbilicus itself. He's slightly tender in the right  periumbilical area.        Assessment & Plan:   #1 umbilical hernia exacerbated by exercises prescribed following arthroscopy of the left knee. He does plan to have a similar procedure done on the right knee. If the  Hernia not addressed the same issue would be expected to recur. He can continue his narcotic pain medication for the pain but referral to a general surgeon is recommended to address this chronic recurrent problem.

## 2010-10-05 NOTE — Patient Instructions (Signed)
He should go to the emergency room if the pain becomes intractable. You should not  eat  anything in that situation.  If the surgery is done electively you should be able  to avoid complications associated with the hernia. Of major concern would be the complication of gangrene from torsion.

## 2010-11-02 ENCOUNTER — Other Ambulatory Visit: Payer: Self-pay | Admitting: Surgery

## 2010-11-02 ENCOUNTER — Encounter (HOSPITAL_COMMUNITY): Payer: Managed Care, Other (non HMO) | Attending: Surgery

## 2010-11-02 DIAGNOSIS — K436 Other and unspecified ventral hernia with obstruction, without gangrene: Secondary | ICD-10-CM | POA: Insufficient documentation

## 2010-11-02 DIAGNOSIS — Z01812 Encounter for preprocedural laboratory examination: Secondary | ICD-10-CM | POA: Insufficient documentation

## 2010-11-02 LAB — CBC
Hemoglobin: 14.2 g/dL (ref 13.0–17.0)
MCH: 29.5 pg (ref 26.0–34.0)
MCV: 90 fL (ref 78.0–100.0)
Platelets: 220 10*3/uL (ref 150–400)
RBC: 4.81 MIL/uL (ref 4.22–5.81)

## 2010-11-10 ENCOUNTER — Ambulatory Visit (HOSPITAL_COMMUNITY)
Admission: RE | Admit: 2010-11-10 | Discharge: 2010-11-10 | Disposition: A | Discharge status: Home / Self Care | Payer: Managed Care, Other (non HMO) | Source: Ambulatory Visit | Attending: Surgery | Admitting: Surgery

## 2010-11-10 DIAGNOSIS — K420 Umbilical hernia with obstruction, without gangrene: Secondary | ICD-10-CM | POA: Insufficient documentation

## 2010-11-10 HISTORY — PX: ABDOMINAL HERNIA REPAIR: SHX539

## 2010-11-14 NOTE — Op Note (Signed)
NAME:  Adrian Burke, Adrian Burke NO.:  [id number]  MEDICAL RECORD NO.:  [id number]           PATIENT TYPE:  O  LOCATION:  PADM                         [facility]ACILITY:  [facility]  PHYSICIAN:  Ardeth Sportsman, MD     DATE OF BIRTH:  1941/01/15  DATE OF PROCEDURE:  11/10/2010 DATE OF DISCHARGE:  11/02/2010                              OPERATIVE REPORT   PRIMARY CARE PHYSICIAN:  Adrian Burke. [place], MD,FACP,FCCP  ORTHOPEDIC SURGEON:  Jene Every, M.D.  SURGEON:  Ardeth Sportsman, MD  PREOPERATIVE DIAGNOSIS:  Incarcerated periumbilical ventral hernia.  POSTOPERATIVE DIAGNOSIS:  Incarcerated periumbilical ventral hernia.  PROCEDURE PERFORMED:  Laparoscopic reduction and repair of periumbilical ventral hernia with mesh.  ANESTHESIA: 1. General anesthesia. 2. Local anesthetic with field block.  SPECIMENS:  None.  DRAINS:  None.  ESTIMATED BLOOD LOSS:  Minimal.  COMPLICATIONS:  None.  INDICATION:  Adrian Burke is a 70 year old overweight male who has noticed some increasing pain and swelling at his belly button.  It has gradually increased in size.  The basic concern is of an incarcerated hernia, recommended surgery.  Anatomy and physiology of abdominal formation was discussed. Pathophysiology of herniation with its natural history and risks were discussed.  Options discussed.  Recommendations were made for reduction repair of hernia.  Given his abdominal girth, size, and activity level, I recommended doing diagnostic laparoscopy with underlay hernia repair. Risks, benefits, alternatives were discussed.  Questions answered and he agreed to proceed.  OPERATIVE FINDINGS:  He had a 2 cm fascial defect at the umbilicus incarcerated with preperitoneal fat.  He had moderate preperitoneal scalloping fat, but there is no evidence of any strangulation.  DESCRIPTION OF PROCEDURE:  Informed consent was confirmed.  The patient underwent general anesthesia without any difficulty.  He had  voided just prior to coming to the operating room.  He received IV clindamycin, gentamicin given his penicillin allergy.  He was positioned supine with arms tucked.  His abdomen was clipped, prepped, and draped in the sterile fashion.  Surgical time-out confirmed our plan.  I placed a #5 mm port in the left upper quadrant using optical entry technique with the patient in the steep reverse Trendelenburg with the left side up.  Entry was clean.  I induced carbon dioxide insufflation. Careful inspection revealed no injury.  Under direct visualization, I placed a 10 mm port in the left flank and a 5 mm port in the left lower quadrant.  I was able to see a fair amount of preperitoneal fat going up into hernia, I reduced it down and the preperitoneal fat, falciform ligament, and umbilical ligament were trimmed off to get a more flat abdominal wall surface. I measured that defect.  I chose a 12 cm in diameter dual-sided mesh (Parietex/Seprafilm) and placed it into the abdomen.  I secured it to the anterior abdominal wall using #1 Prolene interrupted stitches x8.  I used  the laparoscopic tacker to help tack the mesh edges around it. It ensured good hemostasis.  Inspection revealed no injury or other abnormalities.  I placed a 0 Vicryl stitch around the 10 mm port site  and tied that down.  I evacuated carbon dioxide from the port site.  The fascia was stitched down.  I closed the skin and placed sterile dressings.  The patient was extubated, returned to the recovery room in stable in condition.  I have discussed postoperative care with the patient in the office just prior to surgery.  I wrote down the instructions.  I have discussed it with his wife as well.     Ardeth Sportsman, MD     SCG/MEDQ  D:  11/10/2010  T:  11/11/2010  Job:  [id number]  cc:   Adrian Burke. Alwyn Ren, MD,FACP,FCCP [phone] [place] [address]  Electronically Signed by Karie Soda MD on 11/14/2010  01:05:58 PM

## 2010-11-29 NOTE — Assessment & Plan Note (Signed)
[facility]                                 ON-CALL NOTE   LUISFELIPE, ENGELSTAD                       MRN:          [id number]  DATE:01/13/2007                            DOB:          1941-05-20    PHONE NUMBER:  [phone]   Patient of Dr. Alwyn Ren.   I think I spoke to his wife, although she did not identify herself.  The  phone call came at 9:41 p.m. on January 13, 2007.   Mr. Cerrone was working in the garden, thinks he got bit by a spider on  the ankle yesterday.  He used some Cortisone cream and the redness kind  of went away.  Today he has had fever to 102-103, kind of achy  throughout his body.  He has taken these pain aids he has at work which  has just about 100 mg of Tylenol and aspirin and some caffeine in it and  has not done much for his fever, which is not surprising.  He has had no  breathing problems, no skin problems suggestive of infection.  No  urinary symptoms.  No significant GI symptoms.  They call for advice.   PLAN:  I told his wife that he should probably take some Tylenol and she  is going to give him an Extra Strength Tylenol to go on top of the pain  aids he is taking already.  If his temperature comes down and he feels a  little better, he can probably just wait and maybe seek treatment  tomorrow during regular office hours.  If his fever stays up and he  continues to feel poorly, they should probably bring him to the  emergency room.     Karie Schwalbe, MD  Electronically Signed    RIL/MedQ  DD: 01/13/2007  DT: 01/14/2007  Job #: [id number]   cc:   Titus Dubin. Alwyn Ren, MD,FACP,FCCP

## 2010-12-02 NOTE — H&P (Signed)
NAME:  Adrian Burke, DISE NO.:  [id number]   MEDICAL RECORD NO.:  [id number]          PATIENT TYPE:  INP   LOCATION:  4708                         FACILITY:  [facility]   PHYSICIAN:  Wanda Plump, MD [facility]    DATE OF BIRTH:  15-Feb-1941   DATE OF ADMISSION:  04/28/2005  DATE OF DISCHARGE:                                HISTORY & PHYSICAL   CHIEF COMPLAINT:  Chest pain.   PRIMARY CARE DOCTOR:  Okie Bogacz. Alwyn Ren, M.D. [facility].   HISTORY OF PRESENT ILLNESS:  Mr. Emberson is a 70 year old white male who came  to the emergency room complaining of chest pain.  The pain is located at the  left upper chest with some radiation to the left lateral chest and also to  the left supraclavicular area and left arm.  The pain is described as a  hurt and it definitely increased when he touches or when he takes a deep  breath.  There is no associated nausea, shortness of breath, or diaphoresis  and at the time of the examination the patient states that it still hurts a  little.  The worst the chest pain has been is 4/10.  The pain started last  night and he had some more before coming to the hospital.   PAST MEDICAL HISTORY:  1.  Status post rectal fistulectomy on January 2003.  2.  No other surgeries and denies any diabetes or hypertension.   FAMILY HISTORY:  Negative for coronary artery disease, strokes, or diabetes.   SOCIAL HISTORY:  Does not smoke and drink.   REVIEW OF SYSTEMS:  Denies any fever, cough, nausea, vomiting, or diarrhea.  He admits to some slight heartburn lately.  No lower extremity edema.  He  had some ill defined pain in the right lower quadrant yesterday but that is  largely resolved.  He also admits to some numbness on his hands bilaterally.   MEDICATIONS:  Aspirin, vitamins, Metamucil.   ALLERGIES:  1.  CEPHALEXIN.  2.  SULFA.   PHYSICAL EXAMINATION:  GENERAL:  The patient is alert, oriented, in no  apparent distress.  VITAL SIGNS:  Afebrile,  blood pressure 134/81, pulse 69, respiratory rate  18, O2 sat 98% on room air.  NECK:  Full range of motion and there is no subcutaneous crepitus.  LUNGS:  Clear to auscultation bilaterally.  CARDIOVASCULAR:  Regular rate and rhythm without any murmur.  CHEST WALL:  Quite tender to palpation at the area of the pain as described  above.  The area is free of redness, warmness, or a rush.  The pain extends  even to the lateral side of the chest.  ABDOMEN:  Soft, not distended.  It is slightly tender at the epigastrium.  At the time of my exam, the patient said, I just noticed the pain there  when you touch it.  EXTREMITIES:  No edema and the calves are symmetric.  Left shoulder is full  range of motion without any pain.   LABORATORY/X-RAY:  Chest x-ray shows minimal vascular atelectasis.  White  count is 5.3,  hemoglobin 13.6, platelets 236.  Potassium 3.8, creatinine  1.0, blood sugar 88.  LFTs are normal.  Lipase is 30 which is normal.  Point-  of-care cardiac enzymes x3 are negative.  EKG showed basically a normal  sinus rhythm without any evidence of ischemia.   ASSESSMENT/PLAN:  The patient is admitted with quite atypical chest pain.  His cardiovascular risk factors are positive for his age but he is a  nonsmoker, has no family history.  He does not have any hypertension  diabetes that we know of, and his cholesterol at this point is unknown.  My  plan is to check a D-dimmer and if it is positive we will pursue a CT of the  chest, otherwise we will keep him in telemetry, rule him out with serial  enzymes and EKGs.  I think that if he is doing well and all the tests are  negative, he will be okay to be discharged for outpatient stress test.      Wanda Plump, MD [facility]  Electronically Signed     JEP/MEDQ  D:  04/28/2005  T:  04/28/2005  Job:  [id number]   cc:   Titus Dubin. Alwyn Ren, M.D. [facility]  [phone]  [place] [address]

## 2010-12-02 NOTE — Letter (Signed)
May 29, 2006    Vania Rea. Jarold Motto, MD, FACG, FACP, FAGA  [address]  [place], [place] [address]   RE:  Adrian Burke, Adrian Burke  MRN:  [id number]  /  DOB:  Dec 06, 1940   Dear Adrian Burke,   Adrian Burke had a complete physical examination on May 22, 2006. He had  no significant complaints except for some minor arthritic changes. His  history is significant that he underwent rotator cuff surgery in January and  has been out of work from February to June; he continues the have decreased  range of motion and use of the right upper extremity.   He did have a fistulotomy in the anal region in January 2006. His last  colonoscopy apparently was in 1993 and was negative. He has no GI  complaints.   The only positive finding was prostatic hypertrophy and an early Dupuytren's  contracture of the right palm and mild crepitus in his knees.   CBC and DIF, CMET, TSH and PSA were normal. Total cholesterol was 228 with  an LDL of 164. Simvastatin 20 mg will be initiated with followup labs after  8-10 weeks while still on the medication.   Would you please have your office schedule a followup screening colonoscopy.  Apparently his last colonoscopy was done at another facility but he has  requested to see you.    Sincerely,      Adrian Burke. Alwyn Ren, MD,FACP,FCCP  Electronically Signed    WFH/MedQ  DD: 05/29/2006  DT: 05/29/2006  Job #: [id number]

## 2010-12-02 NOTE — Discharge Summary (Signed)
NAME:  Adrian Burke, Adrian Burke NO.:  [id number]   MEDICAL RECORD NO.:  [id number]          PATIENT TYPE:  INP   LOCATION:  4708                         FACILITY:  [facility]   PHYSICIAN:  Titus Dubin. Alwyn Ren, M.D. [facility] OF BIRTH:  07-25-1940   DATE OF ADMISSION:  04/28/2005  DATE OF DISCHARGE:  04/29/2005                                 DISCHARGE SUMMARY   [name] is a 70 year old white male admitted with atypical chest pain and  left upper chest numbness.  For details, please see the dictation [phone])  by Dr. Drue Novel.   He was placed on telemetry and had no dysrhythmias.  Vital signs were  stable, and serial cardiac enzymes were negative.  His numbness in the left  upper chest resolved with two Tylenol on a q.i.d. prn basis.   On the morning of discharge, he was asymptomatic.  He stated that he had had  a stress test 2-3 years prior.   He will be discharged for outpatient followup within one week.  A fasting  [employer] NMR profile will be performed along with homocysteine level,  glucose, and C-reactive protein in an attempt to assess risk factors.  If  the stress test was more than two years prior, it will be repeated.   DISCHARGE STATUS:  Improved.  No change was made in his home medicines.  He  was asked to take Prilosec OTC each morning.   PROGNOSIS:  Depends on results of the laboratory studies and the potential  stress test.      Titus Dubin. Alwyn Ren, M.D. [facility]  Electronically Signed     WFH/MEDQ  D:  04/29/2005  T:  04/29/2005  Job:  [id number]

## 2010-12-02 NOTE — Op Note (Signed)
NAME:  Adrian Burke, Adrian Burke NO.:  [id number]   MEDICAL RECORD NO.:  [id number]          PATIENT TYPE:  AMB   LOCATION:  DSC                          FACILITY:  [facility]   PHYSICIAN:  Thornton Park. Daphine Deutscher, MD  DATE OF BIRTH:  1940/10/11   DATE OF PROCEDURE:  07/27/2004  DATE OF DISCHARGE:                                 OPERATIVE REPORT   PREOPERATIVE DIAGNOSIS:  Anorectal pain with probable fistula.   POSTOPERATIVE DIAGNOSIS:  Anorectal pain with probable fistula.   PROCEDURES:  1.  Examination under anesthesia.  2.  Anal fistulotomy for fistula at the 1-2 o'clock position with the      patient is in the dorsolithotomy position.  This is anterior left upper      quadrant.   SURGEON:  Thornton Park. Daphine Deutscher, M.D.   ANESTHESIA:  By LMA.   DESCRIPTION OF PROCEDURE:  Mr. Mcmartin was taken to room 8 at [facility] on July 27, 2004.  A digital exam was performed.  There was a  firm area at the 2 o'clock position.  I found a little opened area.  I  passed a probe and it passed easily into the anal canal.  It seemed to go  through the superficial subcutaneous sphincter, but did not really effect  much of the internal sphincter.  I went ahead and cut through this with a  knife and used electrocautery to cauterize the base.  Betadine ointment was  massaged into the area.  The patient will be given Lortab Plus to take for  pain.  He was instructed to take sitz baths multiple times a day and after  BMs.  He will be followed up in the office in two to three weeks.   FINAL DIAGNOSIS:  Anal fistula, status post anal fistulotomy.      Matt   MBM/MEDQ  D:  07/27/2004  T:  07/27/2004  Job:  [id number]   cc:   Oley Balm. Georgina Pillion, M.D.  [address]  [place]  [place] [address]  Fax: [phone]   Titus Dubin. Alwyn Ren, M.D. [facility]

## 2010-12-26 ENCOUNTER — Other Ambulatory Visit (HOSPITAL_COMMUNITY): Payer: Managed Care, Other (non HMO)

## 2011-01-02 ENCOUNTER — Ambulatory Visit (HOSPITAL_BASED_OUTPATIENT_CLINIC_OR_DEPARTMENT_OTHER)
Admission: RE | Admit: 2011-01-02 | Discharge: 2011-01-02 | Disposition: A | Discharge status: Home / Self Care | Payer: Managed Care, Other (non HMO) | Source: Ambulatory Visit | Attending: Specialist | Admitting: Specialist

## 2011-01-02 DIAGNOSIS — M23319 Other meniscus derangements, anterior horn of medial meniscus, unspecified knee: Secondary | ICD-10-CM | POA: Insufficient documentation

## 2011-01-02 DIAGNOSIS — Z01812 Encounter for preprocedural laboratory examination: Secondary | ICD-10-CM | POA: Insufficient documentation

## 2011-01-02 DIAGNOSIS — M2240 Chondromalacia patellae, unspecified knee: Secondary | ICD-10-CM | POA: Insufficient documentation

## 2011-01-02 HISTORY — PX: KNEE ARTHROSCOPY: SUR90

## 2011-01-02 LAB — POCT HEMOGLOBIN-HEMACUE: Hemoglobin: 14.1 g/dL (ref 13.0–17.0)

## 2011-01-06 NOTE — Op Note (Signed)
NAME:  Adrian Burke, Adrian Burke NO.:  [id number]  MEDICAL RECORD NO.:  [id number]  LOCATION:                                 FACILITY:  PHYSICIAN:  Jene Every, M.D.    DATE OF BIRTH:  [birth year]  DATE OF PROCEDURE:  01/02/2011 DATE OF DISCHARGE:                              OPERATIVE REPORT   PREOPERATIVE DIAGNOSIS:  Degenerative meniscal tear of the right knee.  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  PROCEDURE PERFORMED: 1. Right knee arthroscopy. 2. Partial medial meniscectomy. 3. Chondroplasty of the medial femoral condyle. 4. Chondroplasty of the patella. 5. Debridement of anterior horn of lateral meniscus.  HISTORY:  A 71 year old knee pain, medial joint line and joint space narrowing, locking, and giving way, indicated for arthroscopic debridement.  Risks and benefits discussed, including bleeding, infection, no change in the symptoms or worsening symptoms, need for repeat debridement, DVT, PE, anesthetic complication, need for total knee, etc.  TECHNIQUE:  With the patient in supine position after induction of adequate general anesthesia, 1 g of Kefzol, right lower extremity was prepped and draped in usual sterile fashion.  A lateral parapatellar portal and superomedial parapatellar portal was fashioned with a #11 blade.  Ingress cannula atraumatically placed.  Irrigant was utilized to insufflate joint.  Under direct visualization, medial parapatellar portal was fashioned with a #11 blade after localization with 18-gauge needle sparing the medial meniscus.  Noted was extensive tearing of the posterior half of the medial meniscus, extending into the joint.  There were some grade 4 changes of the posterior medial aspect of the tibial plateau and  grade 3 changes of the femoral condyle.  Basket rongeur was introduced and I performed a partial medial meniscectomy to the stable base, further contoured with a 3.5 Cuda shaver.  Light chondroplasty performed with the femoral condyle.  There was an extensive area of grade 4 change not amenable to abrasion arthroplasty.  The remnant of the meniscus was stable to probe palpation.  ACL was unremarkable as tearing the anterior horn of the lateral meniscus associated with 3.5 Cuda shaver.  The lateral compartment was fairly tight.  Evaluation of that and probing the posterior aspect revealed it was stable and minimal changes of the femoral condyle and tibial plateau.  Suprapatellar pouch revealed some grade 3 changes of the patella, normal patellofemoral tracking.  Gutters were unremarkable.  Grade 3 changes were noted.  Light chondroplasty performed here.  Knee was copiously lavaged and re-examined in all compartments.  No further pathology amenable arthroscopic intervention, therefore removed all instrumentation.  Portals were closed with 4-0 nylon simple sutures. Marcaine 0.25% with epinephrine was infiltrated into the joint.  The wound was dressed sterilely.  Awoken without difficulty and transported to Recovery in satisfactory condition.  The patient tolerated the procedure well.  No complications and no assistant.     Jene Every, M.D.     Cordelia Pen  D:  01/02/2011  T:  01/03/2011  Job:  [id number]  Electronically Signed by Jene Every  M.D. on 01/06/2011 02:49:46 PM

## 2011-01-11 ENCOUNTER — Other Ambulatory Visit: Payer: Self-pay | Admitting: Internal Medicine

## 2011-02-03 ENCOUNTER — Other Ambulatory Visit: Payer: Self-pay | Admitting: Specialist

## 2011-02-03 ENCOUNTER — Ambulatory Visit (HOSPITAL_COMMUNITY)
Admission: RE | Admit: 2011-02-03 | Discharge: 2011-02-03 | Disposition: A | Discharge status: Home / Self Care | Payer: Managed Care, Other (non HMO) | Source: Ambulatory Visit | Attending: Specialist | Admitting: Specialist

## 2011-02-03 ENCOUNTER — Encounter (HOSPITAL_COMMUNITY): Payer: Managed Care, Other (non HMO)

## 2011-02-03 ENCOUNTER — Other Ambulatory Visit (HOSPITAL_COMMUNITY): Payer: Self-pay | Admitting: Specialist

## 2011-02-03 DIAGNOSIS — Z01818 Encounter for other preprocedural examination: Secondary | ICD-10-CM

## 2011-02-03 DIAGNOSIS — M1710 Unilateral primary osteoarthritis, unspecified knee: Secondary | ICD-10-CM | POA: Insufficient documentation

## 2011-02-03 DIAGNOSIS — Z01812 Encounter for preprocedural laboratory examination: Secondary | ICD-10-CM | POA: Insufficient documentation

## 2011-02-03 LAB — COMPREHENSIVE METABOLIC PANEL
AST: 23 U/L (ref 0–37)
BUN: 14 mg/dL (ref 6–23)
CO2: 29 mEq/L (ref 19–32)
Calcium: 9.5 mg/dL (ref 8.4–10.5)
Chloride: 104 mEq/L (ref 96–112)
Creatinine, Ser: 0.75 mg/dL (ref 0.50–1.35)
GFR calc non Af Amer: >60 mL/min (ref >60)
Total Bilirubin: 0.5 mg/dL (ref 0.3–1.2)

## 2011-02-03 LAB — URINALYSIS, ROUTINE W REFLEX MICROSCOPIC
Bilirubin Urine: NEGATIVE
Ketones, ur: NEGATIVE mg/dL
Leukocytes, UA: NEGATIVE
Nitrite: NEGATIVE
Specific Gravity, Urine: 1.026 (ref 1.005–1.030)
Urobilinogen, UA: 0.2 mg/dL (ref 0.0–1.0)
pH: 7 (ref 5.0–8.0)

## 2011-02-03 LAB — DIFFERENTIAL
Lymphocytes Relative: 30 % (ref 12–46)
Lymphs Abs: 1.5 10*3/uL (ref 0.7–4.0)
Monocytes Relative: 9 % (ref 3–12)
Neutro Abs: 2.8 10*3/uL (ref 1.7–7.7)
Neutrophils Relative %: 56 % (ref 43–77)

## 2011-02-03 LAB — APTT: aPTT: 35 seconds (ref 24–37)

## 2011-02-03 LAB — PROTIME-INR: INR: 1.12 (ref 0.00–1.49)

## 2011-02-03 LAB — SURGICAL PCR SCREEN
MRSA, PCR: NEGATIVE
Staphylococcus aureus: NEGATIVE

## 2011-02-03 LAB — CBC
Platelets: 190 10*3/uL (ref 150–400)
RBC: 4.34 MIL/uL (ref 4.22–5.81)
RDW: 13.2 % (ref 11.5–15.5)
WBC: 4.9 10*3/uL (ref 4.0–10.5)

## 2011-02-10 ENCOUNTER — Inpatient Hospital Stay (HOSPITAL_COMMUNITY)
Admission: RE | Admit: 2011-02-10 | Discharge: 2011-02-13 | DRG: 470 | Disposition: A | Discharge status: Home Health | Payer: Managed Care, Other (non HMO) | Source: Ambulatory Visit | Attending: Specialist | Admitting: Specialist

## 2011-02-10 ENCOUNTER — Inpatient Hospital Stay (HOSPITAL_COMMUNITY): Payer: Managed Care, Other (non HMO)

## 2011-02-10 DIAGNOSIS — Z01812 Encounter for preprocedural laboratory examination: Secondary | ICD-10-CM

## 2011-02-10 DIAGNOSIS — M1710 Unilateral primary osteoarthritis, unspecified knee: Principal | ICD-10-CM | POA: Diagnosis present

## 2011-02-10 HISTORY — PX: REPLACEMENT TOTAL KNEE: SUR1224

## 2011-02-10 LAB — TYPE AND SCREEN: Antibody Screen: NEGATIVE

## 2011-02-11 LAB — CBC
HCT: 37.2 % — ABNORMAL LOW (ref 39.0–52.0)
MCH: 29.4 pg (ref 26.0–34.0)
MCV: 90.3 fL (ref 78.0–100.0)
Platelets: 218 10*3/uL (ref 150–400)
RBC: 4.12 MIL/uL — ABNORMAL LOW (ref 4.22–5.81)

## 2011-02-11 LAB — BASIC METABOLIC PANEL
BUN: 8 mg/dL (ref 6–23)
CO2: 28 mEq/L (ref 19–32)
Calcium: 9 mg/dL (ref 8.4–10.5)
Creatinine, Ser: 0.75 mg/dL (ref 0.50–1.35)
Glucose, Bld: 150 mg/dL — ABNORMAL HIGH (ref 70–99)

## 2011-02-12 LAB — CBC
HCT: 36.8 % — ABNORMAL LOW (ref 39.0–52.0)
MCH: 29.8 pg (ref 26.0–34.0)
MCHC: 33.2 g/dL (ref 30.0–36.0)
MCV: 89.8 fL (ref 78.0–100.0)
Platelets: 215 10*3/uL (ref 150–400)
RDW: 13.3 % (ref 11.5–15.5)

## 2011-02-12 LAB — BASIC METABOLIC PANEL
BUN: 7 mg/dL (ref 6–23)
Calcium: 8.9 mg/dL (ref 8.4–10.5)
Creatinine, Ser: 0.73 mg/dL (ref 0.50–1.35)
GFR calc Af Amer: >60 mL/min (ref >60)

## 2011-02-12 NOTE — Op Note (Signed)
NAME:  Adrian Burke, Adrian Burke NO.:  [id number]  MEDICAL RECORD NO.:  [id number]  LOCATION:  1616                         FACILITY:  [facility]  PHYSICIAN:  Jene Every, M.D.    DATE OF BIRTH:  January 23, 1941  DATE OF PROCEDURE: DATE OF DISCHARGE:                              OPERATIVE REPORT   PREOPERATIVE DIAGNOSIS:  Degenerative joint disease, right knee.  POSTOPERATIVE DIAGNOSIS:  Degenerative joint disease, right knee.  PROCEDURE PERFORMED:  Right total knee arthroplasty.  ANESTHESIA:  General.  ASSISTANT:  Roma Schanz, P.A.  COMPONENTS:  DePuy rotating platform, 4 femur, 5 tibia, 35 patella.  HISTORY:  A 70 year old, end-stage grade IV osteoarthritis of the knee, refractory, modification arthroscopic debridement, indicated for replacement of degenerative joint.  Risks and benefits were discussed including bleeding, infection, damage to neurovascular structures, no change in symptoms, worsening of symptoms, need for repeat debridement, component dislocation, DVT, PE, anesthetic complications, etc.  TECHNIQUE:  Patient in supine position.  After induction of adequate general anesthesia and 1 gram of vanco, the right lower extremity was prepped, draped and exsanguinated in usual sterile fashion.  Thigh tourniquet inflated to 300 mmHg.  A midline incision was made.  Full thickness flaps were developed.  Medial parapatellar arthrotomy was performed.  Patella everted, knee flexed.  Copious portion of clear synovial fluid evacuated.  Tricompartmental osteoarthrosis was noted taken to the medial compartment.  The soft tissues medially, preserving the collateral ligament.  ACL removed.  Remnants from the medial lateral menisci were removed.  Step drill was utilized to enter the femoral canal, irrigated, 5 degrees right place and 11 mm off the distal femur due to slight flexion contracture.  The 11 mm taken off the distal femur with an oscillating cut.  Soft tissues  were protected, we then sized the distal femur off the anterior cortex of the femur to a 4 and then 3 degrees of external rotation.  We then placed an anterior, posterior, and chamfer cuts.  Attention was then turned towards the tibia.  The tibia was subluxed cauterizing geniculate vessels.  The external alignment guide bisecting the ankle parallel to the tibia on the high side, which was laterally.  We pinned it and made our proximal tibial cut.  Following this, we checked flexion and extension gaps.  They were equivalent.  Attention was turned towards completing the tibia subluxed, talus utilized, soft tissue protected, maximal coverage of the thigh and medial aspect of tibial tubercle was essentially reamed, punch guide utilized.  Attention was turned towards the femur.  Distal femoral block applied bisecting the notch, oscillating saw was utilized to perform the distal femoral cut.  Then, put a trial femur and 10 mm insert, reduced it.  There was full extension and full flexion, good stability varus valgus stress to 0 and 30 degrees.  Attention turned towards the patella, was everted and measured to a 24, selected 15 utilizing a 35. We used an oscillating saw and planed the patella and we then drilled the peg holes utilizing the patella.  Trial patella was placed.  We then reduced it.  Good flexion and extension.  Good patellofemoral tracking. Next, all instrumentation was removed.  Checked posteriorly  any remnants of the menisci were removed.  Any vessels cauterized.  Copiously irrigated with pulsatile lavage and then flexed it.  Patella everted, dried all surfaces, mixed the cement on the back table and injected into the tibial canal under digital pressure, pressurized the cement, impacted the tibial tray.  Redundant cement removed, cemented the femur, the cement on the runners impacted and redundant cement removed.  The 10 mm insert reduced, axial load applied throughout the curing  of the cement.  Redundant cement removed.  Cemented the patella as well and after appropriate curing of the cement removed the trial after finding this was satisfactory spacer.  Redundant cement removed.  Copiously irrigated the joint.  Placed a 10-mm insert, reduced it, stable.  Full extension, full flexion, good stability varus valgus stressing from 0 to 30 degrees with excellent patellofemoral tracking.  Wound was copiously irrigated and injected Marcaine with epinephrine into the joint. Hemovac was placed and brought out through the lateral stab wound of the skin with the knee in slight flexion.  We reapproximated the patellar arthrotomy with the #1 Vicryl interrupted figure-of-8 sutures, subcu with 2-0 Vicryl simple sutures and skin was reapproximated with staples. Patient had flexion gravity against 90 degrees.  Good patellofemoral tracking.  No anterior drawer and no patellar clunk.  Sterile dressing applied.  Tourniquet was deflated and there was adequate revascularization of the lower extremity appreciated.  Patient tolerated the procedure well.  No complications.  TOURNIQUET TIME:  90 minutes.     Jene Every, M.D.     Cordelia Pen  D:  02/10/2011  T:  02/10/2011  Job:  [id number]  Electronically Signed by Jene Every M.D. on 02/12/2011 09:50:02 AM

## 2011-02-12 NOTE — H&P (Addendum)
NAME:  Adrian Burke, Adrian Burke NO.:  [id number]  MEDICAL RECORD NO.:  [id number]  LOCATION:                                 FACILITY:  PHYSICIAN:  Jene Every, M.D.    DATE OF BIRTH:  Sep 30, 1940  DATE OF ADMISSION: DATE OF DISCHARGE:                             HISTORY & PHYSICAL   DATE OF ADMISSION:  February 10, 2011  CHIEF COMPLAINT:  Right knee pain.  HISTORY:  Adrian Burke is a pleasant 70 year old gentleman who has noted several months onset of right knee pain, fairly disabling for him.  He actually underwent multiple injections without relief and eventually underwent right knee arthroscopy, which revealed grade 3-4 changes. Patient did note persistent symptoms.  It is felt at this time, he would benefit from a total knee arthroplasty.  The risks and benefits of the surgery were discussed with the patient.  He does elect to proceed.  PAST MEDICAL HISTORY:  Significant for: 1. GERD. 2. B12 deficiency.  CURRENT MEDICATIONS:  Include: 1. Folic acid. 2. Tylenol. 3. Aspirin 81 mg. 4. B12 injections. 5. Multivitamin. 6. Ginkgo biloba. 7. Vitamin D. 8. Magnesium. 9. Pantoprazole. 10.CoQ10. 11.Hydrocodone p.r.n. pain. 12.Motrin p.r.n.  ALLERGIES:  None.  PAST SURGICAL HISTORY:  Include: 1. Bilateral knee arthroscopy. 2. Hernia repair. 3. Right rotator cuff repair.  SOCIAL HISTORY:  Patient is married.  No history of tobacco or alcohol. Patient does continue to work.  He plans to go home following surgery. Dr. Alwyn Ren is his primary care physician.  FAMILY HISTORY:  Mother deceased in her 28s of cancer.  REVIEW OF SYSTEMS:  GENERAL:  Patient denies any fever, chills, night sweats, or bleeding tendencies.  Recently, he did have a root canal and was placed on amoxicillin, which should be "resolved" by the time of surgery.  CNS:  No blurred/double vision, seizure, headache, or paralysis.  RESPIRATORY:  No shortness of breath, productive cough,  or hemoptysis.  CARDIOVASCULAR:  No chest pain, angina, or orthopnea.  GU: No dysuria, hematuria, or discharge.  He got no nausea, vomiting, diarrhea, constipation, melena, bloody stools.  MUSCULOSKELETAL:  As per HPI.  PHYSICAL EXAMINATION:  VITAL SIGNS:  Pulse 60, respiratory rate 10, BP 110/80. GENERAL:  This is a healthy gentleman, seen upright, in no acute distress.  He does walk with a slightly antalgic gait. HEENT:  Atraumatic, normocephalic.  Pupils equal round and reactive. EOMs intact. NECK:  Supple.  No lymphadenopathy. CHEST:  Clear to auscultation bilaterally. HEART:  Regular rate and rhythm. ABDOMEN:  Soft, nontender, nondistended. SKIN:  No rashes or lesions are noted. EXTREMITIES:  In regards to the knee, he does have a healed arthroscopy incision.  Range of motion is 0 to 120.  No effusion.  Currently, nontender on the medial or lateral joint line.  There is mild crepitus on exam.  IMPRESSION:  Excision of joint disease, right knee.  PLAN:  The patient will be admitted to [facility] to undergo right total knee arthroplasty.     Roma Schanz, P.A.   ______________________________ Jene Every, M.D.    CS/MEDQ  D:  02/08/2011  T:  02/08/2011  Job:  [id number]  Electronically Signed  by Roma Schanz P.A. on 03/07/2011 09:18:10 AM Electronically Signed by Jene Every M.D. on 03/08/2011 10:22:32 AM

## 2011-02-13 LAB — CBC
MCHC: 32.6 g/dL (ref 30.0–36.0)
Platelets: 200 10*3/uL (ref 150–400)
RBC: 4.07 MIL/uL — ABNORMAL LOW (ref 4.22–5.81)
RDW: 13.3 % (ref 11.5–15.5)
WBC: 8.5 10*3/uL (ref 4.0–10.5)

## 2011-02-13 LAB — BASIC METABOLIC PANEL
Chloride: 100 mEq/L (ref 96–112)
Creatinine, Ser: 0.8 mg/dL (ref 0.50–1.35)
GFR calc Af Amer: >60 mL/min (ref >60)
GFR calc non Af Amer: >60 mL/min (ref >60)
Potassium: 3.7 mEq/L (ref 3.5–5.1)

## 2011-02-20 ENCOUNTER — Other Ambulatory Visit: Payer: Self-pay | Admitting: Internal Medicine

## 2011-03-14 NOTE — Discharge Summary (Signed)
NAME:  Adrian Burke, Adrian Burke NO.:  [id number]  MEDICAL RECORD NO.:  [id number]  LOCATION:  1616                         FACILITY:  [facility]  PHYSICIAN:  Jene Every, M.D.    DATE OF BIRTH:  06/30/1941  DATE OF ADMISSION:  02/10/2011 DATE OF DISCHARGE:  02/13/2011                              DISCHARGE SUMMARY   ADMISSION DIAGNOSES: 1. Degenerative disk disease, right knee. 2. History of gastroesophageal reflux disease. 3. Vitamin B12 deficiency.  DISCHARGE DIAGNOSES: 1. Degenerative disk disease, right knee. 2. History of gastroesophageal reflux disease. 3. Vitamin B12 deficiency. 4. Status post right total knee arthroplasty.  HISTORY:  Ms. Dittus is a pleasant 70 year old gentleman who is noted to have several month history of right knee pain, fairly disabling for him. He has undergone multiple injections as well as a knee arthroscopy with persistent symptoms, grade 3-4 changes were noted on the arthroscopy, so at this time, the patient would benefit from a total knee arthroplasty. The risks and benefits of the surgery were discussed with the patient. He does elect to proceed.  PROCEDURE:  The patient was taken to the OR, underwent right total knee arthroplasty.  SURGEON:  Jene Every, M.D.  ASSISTANT:  Roma Schanz, P.A.  ANESTHESIA:  General.  COMPLICATIONS:  None.  CONSULTS:  PT/OT Care Management.  LABORATORY DATA:  Postoperative CBC showed white cell count 11.3, hemoglobin 12.1, and hematocrit 37.2.  This was monitored throughout the hospital stay.  White cell count normalized at the time of discharge at 8.5, hemoglobin was stable at 12.0, and hematocrit 26.8.  Routine chemistries showed a sodium 136, potassium 3.8, with normal BUN and creatinine, and slightly elevated glucose.  These remained stable throughout the hospital course.  GFR is greater than 60.  Blood type is A positive.  No preoperative chest x-ray is noted in the  chart.  HOSPITAL COURSE:  The patient was admitted, taken to the OR, and underwent the above stated procedure without difficulty.  He was then transferred to the PACU and then to the orthopedic floor.  One Hemovac drain was placed intraoperatively.  The patient was placed on PCA for pain relief.  On postop day #1, the patient was doing well.  Vital signs were stable.  He was afebrile.  Hemoglobin was stable.  Dressing was clean, dry, and intact.  Hemovac was discontinued.  PT/OT was consulted. Postop day #2, the patient continued do fairly well.  Dressing was changed.  Incision was clean and dry.  Compartments soft.  Xarelto was started as per pharmacy for DVT prophylaxis.  Postop day #3, the patient was doing well, felt ready for discharge.  He was voiding and passing flatus without difficulty.  Slightly febrile at 99.0, asymptomatic. Hemoglobin stable at 12.0.  Incision remained clean and dry.  At this point, it was felt the patient was stable to be discharged home with home health physical therapy.  DISPOSITION:  He will follow up with Dr. Shelle Iron approximately in 10 to 14 days for staple removal and x-ray.  ACTIVITIES:  He is to ambulate as tolerated.  Continue with strict elevation, 6 times a day for 20 minutes at a time.  Continue to use  the knee immobilizer and do straight leg raise x10.  MEDICATIONS:  As per med rec.  He will need to be on Xarelto for a total of 21 days for DVT prophylaxis.  DIET:  As tolerated.  CONDITION ON DISCHARGE:  Stable.  FINAL DIAGNOSIS:  Doing well, status post right total knee arthroplasty.     Roma Schanz, P.A.   ______________________________ Jene Every, M.D.    CS/MEDQ  D:  03/07/2011  T:  03/08/2011  Job:  [id number]  Electronically Signed by Roma Schanz P.A. on 03/13/2011 09:41:24 AM Electronically Signed by Jene Every M.D. on 03/14/2011 03:56:16 PM

## 2011-04-25 ENCOUNTER — Telehealth: Payer: Self-pay

## 2011-04-25 MED ORDER — CYANOCOBALAMIN 1000 MCG/ML IJ SOLN: 1000.0000 ug | INTRAMUSCULAR | Status: DC

## 2011-04-25 NOTE — Telephone Encounter (Signed)
RX sent

## 2011-07-18 HISTORY — PX: UPPER GI ENDOSCOPY: SHX6162

## 2011-08-10 ENCOUNTER — Other Ambulatory Visit: Payer: Self-pay | Admitting: Internal Medicine

## 2011-09-13 ENCOUNTER — Encounter: Payer: Self-pay | Admitting: Gastroenterology

## 2011-12-19 ENCOUNTER — Encounter: Payer: Self-pay | Admitting: Gastroenterology

## 2012-01-08 ENCOUNTER — Ambulatory Visit (AMBULATORY_SURGERY_CENTER): Payer: Managed Care, Other (non HMO) | Admitting: *Deleted

## 2012-01-08 VITALS — Ht 70.0 in | Wt 198.0 lb

## 2012-01-08 DIAGNOSIS — Z1211 Encounter for screening for malignant neoplasm of colon: Secondary | ICD-10-CM

## 2012-01-08 MED ORDER — MOVIPREP 100 G PO SOLR: ORAL | Status: DC

## 2012-01-09 ENCOUNTER — Encounter: Payer: Self-pay | Admitting: Gastroenterology

## 2012-01-12 ENCOUNTER — Telehealth: Payer: Self-pay | Admitting: Gastroenterology

## 2012-01-12 NOTE — Telephone Encounter (Signed)
Informed wife that we don't routinely order antibiotics for a COLON; but if he needs it, please call his PCP or the orthopedic md; wife stated understanding.

## 2012-01-17 ENCOUNTER — Ambulatory Visit (AMBULATORY_SURGERY_CENTER): Payer: Managed Care, Other (non HMO) | Admitting: Gastroenterology

## 2012-01-17 ENCOUNTER — Telehealth: Payer: Self-pay | Admitting: Gastroenterology

## 2012-01-17 ENCOUNTER — Encounter: Payer: Self-pay | Admitting: Gastroenterology

## 2012-01-17 VITALS — BP 136/76 | HR 66 | Resp 20 | Ht 70.0 in | Wt 198.0 lb

## 2012-01-17 DIAGNOSIS — Z86010 Personal history of colonic polyps: Secondary | ICD-10-CM

## 2012-01-17 DIAGNOSIS — D129 Benign neoplasm of anus and anal canal: Secondary | ICD-10-CM

## 2012-01-17 DIAGNOSIS — D128 Benign neoplasm of rectum: Secondary | ICD-10-CM

## 2012-01-17 DIAGNOSIS — Z1211 Encounter for screening for malignant neoplasm of colon: Secondary | ICD-10-CM

## 2012-01-17 DIAGNOSIS — D126 Benign neoplasm of colon, unspecified: Secondary | ICD-10-CM

## 2012-01-17 MED ORDER — SODIUM CHLORIDE 0.9 % IV SOLN: 500.0000 mL | INTRAVENOUS | Status: DC

## 2012-01-17 NOTE — Op Note (Signed)
[facility] [address] [place], [place]  [address]  COLONOSCOPY PROCEDURE REPORT  PATIENT:  Adrian Burke, Adrian Burke  MR#:  [id number] BIRTHDATE:  1940/08/16, 70 yrs. old  GENDER:  male ENDOSCOPIST:  Vania Rea. Jarold Motto, MD, [facility] REF. BY: PROCEDURE DATE:  01/17/2012 PROCEDURE:  Colonoscopy with snare polypectomy ASA CLASS:  Class II INDICATIONS:  history of pre-cancerous (adenomatous) colon polyps  MEDICATIONS:   propofol (Diprivan) 150 mg IV  DESCRIPTION OF PROCEDURE:   After the risks and benefits and of the procedure were explained, informed consent was obtained. Digital rectal exam was performed and revealed no abnormalities. The LB [id number] endoscope was introduced through the anus and advanced to the cecum, which was identified by both the appendix and ileocecal valve.  The quality of the prep was excellent, using MoviPrep.  The instrument was then slowly withdrawn as the colon was fully examined. <<PROCEDUREIMAGES>>  FINDINGS:  Moderate diverticulosis was found in the sigmoid to descending colon segments.  A sessile polyp was found in the rectum. A RECTAL SESSILE POLYP COLD SNARE REMOVED.  This was otherwise a normal examination of the colon.   Retroflexed views in the rectum revealed no abnormalities.    The scope was then withdrawn from the patient and the procedure completed.  COMPLICATIONS:  None ENDOSCOPIC IMPRESSION: 1) Moderate diverticulosis in the sigmoid to descending colon segments 2) Sessile polyp in the rectum 3) Otherwise normal examination R/O ADENOMA RECOMMENDATIONS: 1) Await biopsy results 2) Repeat colonoscopy in 5 years if polyp adenomatous; otherwise 10 years 3) High fiber diet.  REPEAT EXAM:  No  ______________________________ Vania Rea. Jarold Motto, MD, [facility]  CC:  Pecola Lawless, MD  n. Rosalie DoctorMarland Kitchen   Vania Rea. Patterson at 01/17/2012 10:35 AM  Ihor Gully, [id number]

## 2012-01-17 NOTE — Telephone Encounter (Signed)
See previous phone note.  

## 2012-01-17 NOTE — Patient Instructions (Addendum)
Handouts were given to your care partner on diverticulosis, high fiber diet and polyps.  You may resume your prior medications today.  Please call if any questions or concerns.   YOU HAD AN ENDOSCOPIC PROCEDURE TODAY AT THE [facility]: Refer to the procedure report that was given to you for any specific questions about what was found during the examination.  If the procedure report does not answer your questions, please call your gastroenterologist to clarify.  If you requested that your care partner not be given the details of your procedure findings, then the procedure report has been included in a sealed envelope for you to review at your convenience later.  YOU SHOULD EXPECT: Some feelings of bloating in the abdomen. Passage of more gas than usual.  Walking can help get rid of the air that was put into your GI tract during the procedure and reduce the bloating. If you had a lower endoscopy (such as a colonoscopy or flexible sigmoidoscopy) you may notice spotting of blood in your stool or on the toilet paper. If you underwent a bowel prep for your procedure, then you may not have a normal bowel movement for a few days.  DIET: Your first meal following the procedure should be a light meal and then it is ok to progress to your normal diet.  A half-sandwich or bowl of soup is an example of a good first meal.  Heavy or fried foods are harder to digest and may make you feel nauseous or bloated.  Likewise meals heavy in dairy and vegetables can cause extra gas to form and this can also increase the bloating.  Drink plenty of fluids but you should avoid alcoholic beverages for 24 hours.  ACTIVITY: Your care partner should take you home directly after the procedure.  You should plan to take it easy, moving slowly for the rest of the day.  You can resume normal activity the day after the procedure however you should NOT DRIVE or use heavy machinery for 24 hours (because of the sedation medicines  used during the test).    SYMPTOMS TO REPORT IMMEDIATELY: A gastroenterologist can be reached at any hour.  During normal business hours, 8:30 AM to 5:00 PM Monday through Friday, call [phone].  After hours and on weekends, please call the GI answering service at [phone] who will take a message and have the physician on call contact you.   Following lower endoscopy (colonoscopy or flexible sigmoidoscopy):  Excessive amounts of blood in the stool  Significant tenderness or worsening of abdominal pains  Swelling of the abdomen that is new, acute  Fever of 100F or higher   FOLLOW UP: If any biopsies were taken you will be contacted by phone or by letter within the next 1-3 weeks.  Call your gastroenterologist if you have not heard about the biopsies in 3 weeks.  Our staff will call the home number listed on your records the next business day following your procedure to check on you and address any questions or concerns that you may have at that time regarding the information given to you following your procedure. This is a courtesy call and so if there is no answer at the home number and we have not heard from you through the emergency physician on call, we will assume that you have returned to your regular daily activities without incident.  SIGNATURES/CONFIDENTIALITY: You and/or your care partner have signed paperwork which will be entered into your electronic   medical record.  These signatures attest to the fact that that the information above on your After Visit Summary has been reviewed and is understood.  Full responsibility of the confidentiality of this discharge information lies with you and/or your care-partner.  

## 2012-01-17 NOTE — Progress Notes (Signed)
Patient did not have preoperative order for IV antibiotic SSI prophylaxis. (G8918)  Patient did not experience any of the following events: a burn prior to discharge; a fall within the facility; wrong site/side/patient/procedure/implant event; or a hospital transfer or hospital admission upon discharge from the facility. (G8907)  

## 2012-01-17 NOTE — Telephone Encounter (Signed)
Pt had a colonoscopy this morning with Dr. Jarold Motto, had a normal bowel movement but was flowing with blood and it took "quite a while to stop it" would like a call back from a nurse.

## 2012-01-17 NOTE — Telephone Encounter (Signed)
Pt states he had a bowel movement and passed a large amount of blood. No blood clots.  States it took about 20 minutes to get the bleeding to stop.  Has not seen any more blood since.  Feels fine.  Dr Jarold Motto on floor and spoke with him concerning patient.  Instructed for pt to stay on clear liquids today and no aspirin or aspirin products for two weeks.  If he has any more problems or has anymore bleeding to call the number given to him with discharge papers or he can go to emergency room.  Informed Adrian Burke of these instructions and he verbalizes understanding.

## 2012-01-17 NOTE — Telephone Encounter (Signed)
Adrian Burke, [place] 01/17/2012 3:21 PM Signed  Pt had a colonoscopy this morning with Dr. Jarold Motto, had a normal bowel movement but was flowing with blood and it took "quite a while to stop it" would like a call back from a nurse.

## 2012-01-17 NOTE — Progress Notes (Signed)
Propofol given and oxygen managed per D Merritt CRNA 

## 2012-01-17 NOTE — Progress Notes (Signed)
No complaints noted in the recovery room. Maw   

## 2012-01-19 ENCOUNTER — Telehealth: Payer: Self-pay | Admitting: *Deleted

## 2012-01-22 ENCOUNTER — Encounter: Payer: Self-pay | Admitting: Gastroenterology

## 2012-02-14 ENCOUNTER — Other Ambulatory Visit: Payer: Self-pay | Admitting: Internal Medicine

## 2012-03-01 ENCOUNTER — Encounter: Payer: Self-pay | Admitting: Internal Medicine

## 2012-03-01 ENCOUNTER — Ambulatory Visit (INDEPENDENT_AMBULATORY_CARE_PROVIDER_SITE_OTHER): Payer: Managed Care, Other (non HMO) | Admitting: Internal Medicine

## 2012-03-01 ENCOUNTER — Encounter: Payer: Self-pay | Admitting: Gastroenterology

## 2012-03-01 VITALS — BP 116/70 | HR 72 | Temp 98.2°F | Resp 12 | Ht 68.08 in | Wt 196.4 lb

## 2012-03-01 DIAGNOSIS — E785 Hyperlipidemia, unspecified: Secondary | ICD-10-CM

## 2012-03-01 DIAGNOSIS — K219 Gastro-esophageal reflux disease without esophagitis: Secondary | ICD-10-CM

## 2012-03-01 DIAGNOSIS — Z862 Personal history of diseases of the blood and blood-forming organs and certain disorders involving the immune mechanism: Secondary | ICD-10-CM

## 2012-03-01 DIAGNOSIS — Z23 Encounter for immunization: Secondary | ICD-10-CM

## 2012-03-01 DIAGNOSIS — R51 Headache: Secondary | ICD-10-CM

## 2012-03-01 DIAGNOSIS — D126 Benign neoplasm of colon, unspecified: Secondary | ICD-10-CM

## 2012-03-01 DIAGNOSIS — Z0000 Encounter for general adult medical examination without abnormal findings: Secondary | ICD-10-CM

## 2012-03-01 LAB — IBC PANEL
Iron: 93 ug/dL (ref 42–165)
Transferrin: 208.5 mg/dL — ABNORMAL LOW (ref 212.0–360.0)

## 2012-03-01 LAB — BASIC METABOLIC PANEL
CO2: 28 mEq/L (ref 19–32)
Calcium: 9.4 mg/dL (ref 8.4–10.5)
Chloride: 104 mEq/L (ref 96–112)
Creatinine, Ser: 0.7 mg/dL (ref 0.4–1.5)
Sodium: 140 mEq/L (ref 135–145)

## 2012-03-01 LAB — LIPID PANEL
Total CHOL/HDL Ratio: 4
Triglycerides: 139 mg/dL (ref 0.0–149.0)
VLDL: 27.8 mg/dL (ref 0.0–40.0)

## 2012-03-01 LAB — HEPATIC FUNCTION PANEL
ALT: 22 U/L (ref 0–53)
AST: 29 U/L (ref 0–37)
Albumin: 4.5 g/dL (ref 3.5–5.2)
Alkaline Phosphatase: 78 U/L (ref 39–117)
Bilirubin, Direct: 0.1 mg/dL (ref 0.0–0.3)
Total Protein: 7.6 g/dL (ref 6.0–8.3)

## 2012-03-01 LAB — CBC WITH DIFFERENTIAL/PLATELET
Basophils Relative: 0.8 % (ref 0.0–3.0)
Eosinophils Absolute: 0.2 10*3/uL (ref 0.0–0.7)
Eosinophils Relative: 3.2 % (ref 0.0–5.0)
Hemoglobin: 14.7 g/dL (ref 13.0–17.0)
Lymphocytes Relative: 31.1 % (ref 12.0–46.0)
MCHC: 33 g/dL (ref 30.0–36.0)
Neutro Abs: 2.9 10*3/uL (ref 1.4–7.7)
Neutrophils Relative %: 58 % (ref 43.0–77.0)
RBC: 4.83 Mil/uL (ref 4.22–5.81)
WBC: 5.1 10*3/uL (ref 4.5–10.5)

## 2012-03-01 LAB — TSH: TSH: 0.86 u[IU]/mL (ref 0.35–5.50)

## 2012-03-01 MED ORDER — RANITIDINE HCL 150 MG PO TABS: 150.0000 mg | ORAL_TABLET | Freq: Two times a day (BID) | ORAL | Status: DC

## 2012-03-01 NOTE — Progress Notes (Signed)
  Subjective:    Patient ID: Adrian Burke, male    DOB: [birth year], 71 y.o.   MRN: [id number]  HPI  Adrian Burke is here for a physical;acute issues include intermittent food or pill dysphagia for 6+ months.      Review of Systems He denies persistent hoarseness, unexplained weight loss, abdominal pain, melena, or rectal bleeding. He had a colonoscopy this year with resection of an adenomatous polyp. He is on a every five-year monitor for this.  He believes he may have had an upper endoscopy and possible esophageal dilation but he is unsure of the specifics.     Objective:   Physical Exam Gen.: well-nourished in appearance. Alert, appropriate and cooperative throughout exam.Appears younger than stated age  Head: Normocephalic without obvious abnormalities  Eyes: No corneal or conjunctival inflammation noted. Pupils equal round reactive to light and accommodation. Fundal exam is benign without hemorrhages, exudate, papilledema. Extraocular motion intact. Vision grossly normal with lenses. Ears: External  ear exam reveals no significant lesions or deformities. Canals clear .TMs normal. Hearing is grossly normal bilaterally. Nose: External nasal exam reveals no deformity or inflammation. Nasal mucosa are pink and moist. No lesions or exudates noted.   Mouth: Oral mucosa and oropharynx reveal no lesions or exudates. Teeth in good repair. Neck: No deformities, masses, or tenderness noted. Range of motion & Thyroid normal. Lungs: Normal respiratory effort; chest expands symmetrically. Lungs are clear to auscultation without rales, wheezes, or increased work of breathing. Heart: Normal rate and rhythm. Normal S1 and S2. No gallop, click, or rub. S4 w/o murmur. Abdomen: Bowel sounds normal; abdomen soft and nontender. No masses, organomegaly or hernias noted. Genitalia/DRE :  Genitalia normal except for left varices. Prostate is normal without enlargement, asymmetry, nodularity, or induration.                         Musculoskeletal/extremities: No deformity or scoliosis noted of  the thoracic or lumbar spine. No clubbing, cyanosis, edema, or deformity noted. Range of motion  normal .Tone & strength  Normal.Fusiform changes R knee. Chronic toe nail changes Vascular: Carotid, radial artery, dorsalis pedis and  posterior tibial pulses are full and equal. No bruits present. Neurologic: Alert and oriented x3. Deep tendon reflexes symmetrical and normal.          Skin: Intact without suspicious lesions or rashes. Lymph: No cervical, axillary, or inguinal lymphadenopathy present. Psych: Mood and affect are normal. Normally interactive                                                                                         Assessment & Plan:  #1 comprehensive physical exam; no acute findings #2 see Problem List with Assessments & Recommendations  #3 dysphagia; GI referral indicated Plan: see Orders

## 2012-03-01 NOTE — Addendum Note (Signed)
Addended by: Maurice Small on: 03/01/2012 11:46 AM   Modules accepted: Orders

## 2012-03-01 NOTE — Patient Instructions (Addendum)
Preventive Health Care: Exercise at least 30-45 minutes a day,  3-4 days a week.  Eat a low-fat diet with lots of fruits and vegetables, up to 7-9 servings per day. Consume less than 40 grams of sugar per day from foods & drinks with High Fructose Corn Sugar as #1,2,3 or # 4 on label. The triggers for reflux  include stress; the "aspirin family" ; alcohol; peppermint; and caffeine (coffee, tea, cola, and chocolate). The aspirin family would include aspirin and the nonsteroidal agents such as ibuprofen &  Naproxen. Tylenol would not cause reflux. If having symptoms ; food & drink should be avoided for @ least 2 hours before going to bed.   If you activate My Chart; the results can be released to you as soon as they populate from the lab.If you choose not to use this program; the labs have to be reviewed, copied & mailed   causing a delay in getting the results to you.

## 2012-03-17 DIAGNOSIS — K2270 Barrett's esophagus without dysplasia: Secondary | ICD-10-CM

## 2012-03-17 HISTORY — DX: Barrett's esophagus without dysplasia: K22.70

## 2012-03-19 ENCOUNTER — Ambulatory Visit (INDEPENDENT_AMBULATORY_CARE_PROVIDER_SITE_OTHER): Payer: Managed Care, Other (non HMO) | Admitting: Family

## 2012-03-19 ENCOUNTER — Telehealth: Payer: Self-pay | Admitting: Internal Medicine

## 2012-03-19 ENCOUNTER — Encounter: Payer: Self-pay | Admitting: Family

## 2012-03-19 VITALS — BP 126/74 | HR 85 | Temp 97.5°F | Resp 16 | Wt 198.0 lb

## 2012-03-19 DIAGNOSIS — L259 Unspecified contact dermatitis, unspecified cause: Secondary | ICD-10-CM

## 2012-03-19 DIAGNOSIS — L309 Dermatitis, unspecified: Secondary | ICD-10-CM | POA: Insufficient documentation

## 2012-03-19 MED ORDER — HYDROCORTISONE 2 % EX LOTN: TOPICAL_LOTION | CUTANEOUS | Status: DC

## 2012-03-19 NOTE — Progress Notes (Signed)
Subjective:    Patient ID: Adrian Burke, male    DOB: 10/01/1940, 71 y.o.   MRN: [id number]  HPI  Mr.  Hasler is a 71 yr old male who presents today with chief complaint of rash.  Pt reports rash on both arms and neck x 3 days. Overally, he notes that the rash is improving. Denies exposure to poison ivy.  Reports that he was recently started on Zantac for gerd about 2 weeks ago.  Wonders if it could be a reaction to the zantac.  Also reports that he had been using tea tree oil on his toenails due to onychomycosis and developed blistering around the nail beds- so he discontinued the tea tree oil.    Review of Systems See HPI  Past Medical History  Diagnosis Date  . B12 deficiency     on monthly injections  . Headache     spinal tap as OP   . Chest pain     2006 and 2011 ( neg nuclear stress test 2004) MI R/Oed  . Prostate nodule 2000    resolved w/o treatement; Dr Annabell Howells  . Elevated PSA     due to prostatis  . Hyperlipidemia     NMR Lipoprofile 2005, LDL 125 (1309/534), HDL 38, TG 87. LDL Goal=<130. [place] study LDL Goald=<160.   . Elevated homocysteine   . Tubular adenoma     Dr Jarold Motto  . Arthritis     History   Social History  . Marital Status: Married    Spouse Name: N/A    Number of Children: N/A  . Years of Education: N/A   Occupational History  . Traffic Co-ordinator for [employer]    Social History Main Topics  . Smoking status: Never Smoker   . Smokeless tobacco: Never Used  . Alcohol Use: No  . Drug Use: No  . Sexually Active: Not on file   Other Topics Concern  . Not on file   Social History Narrative   No dietExercise- irregulaly    Past Surgical History  Procedure Date  . Rectal fistula repair 07/2004  . Rotator cuff repair 2007    Dr.Jeff Beane  . Prostate biopsy 2000    Dr.Wrenn  . Knee arthroscopy 09/22/2010    left  . Abdominal hernia repair 11/10/2010    mesh  . Knee arthroscopy 01/02/2011    right  . Replacement total knee  02/10/2011    right  . Colonoscopy w/ polypectomy 2001,2007,2013    F/U every 5 years; Dr Jarold Motto    Family History  Problem Relation Age of Onset  . Cancer Mother     ? of primary  . Brain cancer Maternal Aunt   . Heart attack Neg Hx   . Diabetes Neg Hx   . Stroke Neg Hx     Allergies  Allergen Reactions  . Zantac (Ranitidine Hcl)     rash    Current Outpatient Prescriptions on File Prior to Visit  Medication Sig Dispense Refill  . acetaminophen (TYLENOL) 650 MG CR tablet Take 650 mg by mouth daily as needed.        [name] aspirin 81 MG EC tablet 81 mg daily.        . B-D TB SYRINGE 1CC/27GX1/2" 27G X 1/2" 1 ML MISC USE AS DIRECTED FOR VIT B12 INJ  12 each  11  . Cholecalciferol (VITAMIN D3) 1000 UNITS CAPS Take by mouth daily.      . cyanocobalamin (,VITAMIN B-12,)  1000 MCG/ML injection Inject 1 mL (1,000 mcg total) into the muscle every 30 (thirty) days.  30 mL  1  . folic acid (FOLVITE) 1 MG tablet TAKE ONE TABLET BY MOUTH EVERY DAY  90 tablet  0  . Glucosamine HCl POWD by Does not apply route daily. 1 by mouth two times daily      . Multiple Vitamin (MULTIVITAMIN) capsule Take 1 capsule by mouth daily.        . mupirocin (BACTROBAN) 2 % ointment Apply two times a day after cleaning umbilicus.       [name] omeprazole (PRILOSEC OTC) 20 MG tablet Take 20 mg by mouth daily.        BP 126/74  Pulse 85  Temp 97.5 F (36.4 C) (Oral)  Resp 16  Wt 198 lb 0.6 oz (89.83 kg)  SpO2 98%       Objective:   Physical Exam  Constitutional: He appears well-developed and well-nourished.  Skin:       Mild erythematous rash noted on bilateral forearms with few small areas or excoriation  Psychiatric: He has a normal mood and affect. His behavior is normal. Judgment and thought content normal.          Assessment & Plan:

## 2012-03-19 NOTE — Telephone Encounter (Signed)
Left message on voicemail for patient to return call to schedule appointment today. Patient instructed to bring ALL pill bottles (including supplements), I made patient aware although Dr.Hopper's schedule is full we can get him in to see Melissa at the [facility] office. (at the time I left message for patient, Melissa with several open slots)

## 2012-03-19 NOTE — Patient Instructions (Addendum)
Stop Zantac and instead start prilosec otc once daily for reflux. Call if your symptoms worsen or if no improvement in the next 1 week.

## 2012-03-19 NOTE — Telephone Encounter (Signed)
The pt called back, scheduled with Ms.Lendell Caprice this afternoon. Thanks!

## 2012-03-19 NOTE — Telephone Encounter (Signed)
4:30 PM today if he does not have an appointment with Ms.Adrian Burke

## 2012-03-19 NOTE — Telephone Encounter (Signed)
Caller: [name]/Patient; Patient Name: Adrian Burke; PCP: Marga Melnick; Best Callback Phone Number: [phone]; Reason for call: Rash/Hives started Zantac on 03-01-12   03-18-12 noticed a fine red rash all over that itches some.  No breathing problems  03-17-12 he has noticed some bruising that he can't recall any injury  the areas do not hurt.  Not on any blood thinners. He also was using some type of over the counter medicine on his toes for some blisters.   The bruising is not in same areas as rash.  All emergent symptoms per Rash protocols ruled out except for new onset rash after beginning new prescribed, nonprescribed or alternative/complementary medication.     Due to rash and unexplained bruising he needs to be seen in 4 hours

## 2012-03-19 NOTE — Assessment & Plan Note (Signed)
I am not certain that this rash is related to zantac, but will d/c and change to prilosec otc.  Will add hydrocortisone 2% prn.  Pt to call if symptoms worsen or if no improvement. Pt verbalizes understanding.

## 2012-03-19 NOTE — Telephone Encounter (Signed)
Please advise MD Hopper placement advice for this pt, no apt noted at this time

## 2012-03-20 ENCOUNTER — Telehealth: Payer: Self-pay | Admitting: Internal Medicine

## 2012-03-20 MED ORDER — HYDROCORTISONE 2.5 % EX LOTN: TOPICAL_LOTION | Freq: Two times a day (BID) | CUTANEOUS | Status: DC

## 2012-03-20 NOTE — Telephone Encounter (Signed)
Hydrocortisone, topical 2 %. Apply twice daily to the affected area as needed   Pharmacy comments: does not come in 2%, can we do 1%

## 2012-03-20 NOTE — Telephone Encounter (Signed)
Reorder, per pharmacy, Rx only available in 1% or 2.5%; per Vo reorder for 2.5% lotion/SLS

## 2012-03-28 ENCOUNTER — Ambulatory Visit (INDEPENDENT_AMBULATORY_CARE_PROVIDER_SITE_OTHER): Payer: Managed Care, Other (non HMO) | Admitting: Gastroenterology

## 2012-03-28 ENCOUNTER — Encounter: Payer: Self-pay | Admitting: Gastroenterology

## 2012-03-28 VITALS — BP 110/70 | HR 68 | Ht 67.5 in | Wt 197.0 lb

## 2012-03-28 DIAGNOSIS — IMO0001 Reserved for inherently not codable concepts without codable children: Secondary | ICD-10-CM

## 2012-03-28 DIAGNOSIS — R1310 Dysphagia, unspecified: Secondary | ICD-10-CM

## 2012-03-28 DIAGNOSIS — K219 Gastro-esophageal reflux disease without esophagitis: Secondary | ICD-10-CM

## 2012-03-28 DIAGNOSIS — Z860100 Personal history of colon polyps, unspecified: Secondary | ICD-10-CM

## 2012-03-28 NOTE — Progress Notes (Signed)
History of Present Illness:  This is a  71 year old Caucasian male recently performed colonoscopy and removed an adenomatous polyp. He denies lower gastrointestinal symptoms but is referred by Dr. Alwyn Ren for evaluation of at least 5 years of progressive solid food dysphagia. He denies typical reflux symptoms but was on Zantac for many years, recently started on daily Prilosec. His dysphagia for solid foods in the mid substernal area. He has not had previous endoscopic or barium studies of his upper gut. Family history is noncontributory. He denies any history of hepatobiliary symptoms, hepatitis or pancreatitis. His appetite is good and his weight is stable, and he denies systemic complaints.  I have reviewed this patient's present history, medical and surgical past history, allergies and medications.     ROS: The remainder of the 10 point ROS is negative.. he did have repair of an umbilical hernia by Dr. Star Age. Patient also has had chronic urologic problems with a previous prostate biopsy. He also has chronic B12 deficiency and is on monthly parenteral replacement therapy.     Physical Exam: Blood pressure 110/70, pulse 60 and regular, weight 197 pounds the BMI of 30.40. General well developed well nourished patient in no acute distress, appearing their stated age Eyes PERRLA, no icterus, fundoscopic exam per opthamologist Skin no lesions noted Neck supple, no adenopathy, no thyroid enlargement, no tenderness Chest clear to percussion and auscultation Heart no significant murmurs, gallops or rubs noted Abdomen no hepatosplenomegaly masses or tenderness, BS normal.  Extremities no acute joint lesions, edema, phlebitis or evidence of cellulitis. Neurologic patient oriented x 3, cranial nerves intact, no focal neurologic deficits noted. Psychological mental status normal and normal affect.  Assessment and plan: Probable chronic GERD with peptic stricture the esophagus, rule out Barrett's  mucosa or esophageal carcinoma. I have asked him to continue daily Prilosec with standard antireflux maneuvers. He is due for followup colonoscopy as per clinical protocol. Lab review shows normal CBC, metabolic profile , liver function tests, and anemia profile. He is otherwise continue medications as listed and reviewed by Dr. Alwyn Ren.  Cc Dr. Marga Melnick  Encounter Diagnoses  Name Primary?  [name] Dysphagia Yes  . Reflux

## 2012-03-28 NOTE — Patient Instructions (Addendum)
You have been scheduled for an endoscopy with propofol. Please follow written instructions given to you at your visit today. If you use inhalers (even only as needed), please bring them with you on the day of your procedure.  CC: Marga Melnick, M. D.

## 2012-04-01 ENCOUNTER — Ambulatory Visit (AMBULATORY_SURGERY_CENTER): Payer: Managed Care, Other (non HMO) | Admitting: Gastroenterology

## 2012-04-01 ENCOUNTER — Encounter: Payer: Self-pay | Admitting: Gastroenterology

## 2012-04-01 VITALS — BP 137/85 | HR 64 | Temp 97.9°F | Resp 20 | Ht 67.0 in | Wt 197.0 lb

## 2012-04-01 DIAGNOSIS — K2270 Barrett's esophagus without dysplasia: Secondary | ICD-10-CM

## 2012-04-01 DIAGNOSIS — R1310 Dysphagia, unspecified: Secondary | ICD-10-CM

## 2012-04-01 DIAGNOSIS — K219 Gastro-esophageal reflux disease without esophagitis: Secondary | ICD-10-CM

## 2012-04-01 MED ORDER — SODIUM CHLORIDE 0.9 % IV SOLN: 500.0000 mL | INTRAVENOUS | Status: DC

## 2012-04-01 NOTE — Patient Instructions (Addendum)
Discharge instructions given with verbal understanding. A dilatation diet given. Resume previous medications. YOU HAD AN ENDOSCOPIC PROCEDURE TODAY AT THE [facility]: Refer to the procedure report that was given to you for any specific questions about what was found during the examination.  If the procedure report does not answer your questions, please call your gastroenterologist to clarify.  If you requested that your care partner not be given the details of your procedure findings, then the procedure report has been included in a sealed envelope for you to review at your convenience later.  YOU SHOULD EXPECT: Some feelings of bloating in the abdomen. Passage of more gas than usual.  Walking can help get rid of the air that was put into your GI tract during the procedure and reduce the bloating. If you had a lower endoscopy (such as a colonoscopy or flexible sigmoidoscopy) you may notice spotting of blood in your stool or on the toilet paper. If you underwent a bowel prep for your procedure, then you may not have a normal bowel movement for a few days.  DIET: Your first meal following the procedure should be a light meal and then it is ok to progress to your normal diet.  A half-sandwich or bowl of soup is an example of a good first meal.  Heavy or fried foods are harder to digest and may make you feel nauseous or bloated.  Likewise meals heavy in dairy and vegetables can cause extra gas to form and this can also increase the bloating.  Drink plenty of fluids but you should avoid alcoholic beverages for 24 hours.  ACTIVITY: Your care partner should take you home directly after the procedure.  You should plan to take it easy, moving slowly for the rest of the day.  You can resume normal activity the day after the procedure however you should NOT DRIVE or use heavy machinery for 24 hours (because of the sedation medicines used during the test).    SYMPTOMS TO REPORT IMMEDIATELY: A  gastroenterologist can be reached at any hour.  During normal business hours, 8:30 AM to 5:00 PM Monday through Friday, call [phone].  After hours and on weekends, please call the GI answering service at [phone] who will take a message and have the physician on call contact you.   Following upper endoscopy (EGD)  Vomiting of blood or coffee ground material  New chest pain or pain under the shoulder blades  Painful or persistently difficult swallowing  New shortness of breath  Fever of 100F or higher  Black, tarry-looking stools  FOLLOW UP: If any biopsies were taken you will be contacted by phone or by letter within the next 1-3 weeks.  Call your gastroenterologist if you have not heard about the biopsies in 3 weeks.  Our staff will call the home number listed on your records the next business day following your procedure to check on you and address any questions or concerns that you may have at that time regarding the information given to you following your procedure. This is a courtesy call and so if there is no answer at the home number and we have not heard from you through the emergency physician on call, we will assume that you have returned to your regular daily activities without incident.  SIGNATURES/CONFIDENTIALITY: You and/or your care partner have signed paperwork which will be entered into your electronic medical record.  These signatures attest to the fact that that the information above on   your After Visit Summary has been reviewed and is understood.  Full responsibility of the confidentiality of this discharge information lies with you and/or your care-partner. 

## 2012-04-01 NOTE — Op Note (Signed)
[facility] [address] [place], [address]   ENDOSCOPY PROCEDURE REPORT  PATIENT: Adrian Burke, Adrian Burke  MR#: [id number] BIRTHDATE: [birth year] , 70  yrs. old GENDER: Male ENDOSCOPIST:Bobbi Kozakiewicz Hale Bogus, MD, [facility] Graham REFERRED BY: Marga Melnick, M.D. PROCEDURE DATE:  04/01/2012 PROCEDURE:   EGD w/ biopsy and Maloney dilation of esophagus ASA CLASS:    Class II INDICATIONS: dysphagia. MEDICATION: propofol (Diprivan) 200mg  IV TOPICAL ANESTHETIC:   Cetacaine Spray  DESCRIPTION OF PROCEDURE:   After the risks and benefits of the procedure were explained, informed consent was obtained.  The [facility] GIF-H180 [id number]  endoscope was introduced through the mouth  and advanced to the second portion of the duodenum .  The instrument was slowly withdrawn as the mucosa was fully examined.      ESOPHAGUS: There was evidence of suspected Barrett's esophagus. Multiple biopsies were performed.   The esophagus was otherwise normal.    Maloney dilation..#88F passed without difficulty.  STOMACH: The mucosa of the stomach appeared normal.  Normal exam of duodenum.    Retroflexed views revealed no abnormalities.    The scope was then withdrawn from the patient and the procedure completed.  COMPLICATIONS: There were no complications.   ENDOSCOPIC IMPRESSION: 1.   There was evidence of suspected Barrett's esophagus; multiple biopsies ,possible occult stricture dilated vs. motility disorder of esophagus. 2.   The esophagus was otherwise normal. 3.   The mucosa of the stomach appeared normal 4.   Normal exam of duodenum.  RECOMMENDATIONS: 1.  continue current medications 2.  Clear liquids until  , then soft foods rest iof day.  Resume prior diet tomorrow. if dysphagia persists,will schedule esophageal manometry.   _______________________________ eSignedMardella Layman, MD, [facility] 04/01/2012 4:28 PM

## 2012-04-01 NOTE — Progress Notes (Signed)
Patient did not experience any of the following events: a burn prior to discharge; a fall within the facility; wrong site/side/patient/procedure/implant event; or a hospital transfer or hospital admission upon discharge from the facility. (G8907) Patient did not have preoperative order for IV antibiotic SSI prophylaxis. (G8918)  

## 2012-04-02 ENCOUNTER — Telehealth: Payer: Self-pay | Admitting: *Deleted

## 2012-04-02 NOTE — Telephone Encounter (Signed)
  Follow up Call-  Call back number 04/01/2012  Post procedure Call Back phone  # [phone]  Permission to leave phone message Yes     Patient questions:  Do you have a fever, pain , or abdominal swelling? no Pain Score  0 *  Have you tolerated food without any problems? yes  Have you been able to return to your normal activities? yes  Do you have any questions about your discharge instructions: Diet   no Medications  no Follow up visit  no  Do you have questions or concerns about your Care? no  Actions: * If pain score is 4 or above: No action needed, pain <4. Pt states my throat feels fine and ive eaten with no problems. ewm

## 2012-04-05 ENCOUNTER — Encounter: Payer: Self-pay | Admitting: Gastroenterology

## 2012-04-16 ENCOUNTER — Other Ambulatory Visit: Payer: Self-pay | Admitting: Internal Medicine

## 2012-06-18 ENCOUNTER — Other Ambulatory Visit: Payer: Self-pay | Admitting: Internal Medicine

## 2012-08-20 ENCOUNTER — Encounter (HOSPITAL_COMMUNITY): Payer: Self-pay | Admitting: Emergency Medicine

## 2012-08-20 ENCOUNTER — Emergency Department (HOSPITAL_COMMUNITY)
Admission: EM | Admit: 2012-08-20 | Discharge: 2012-08-20 | Disposition: A | Discharge status: Home / Self Care | Payer: 59 | Source: Home / Self Care | Attending: Family Medicine | Admitting: Family Medicine

## 2012-08-20 DIAGNOSIS — K529 Noninfective gastroenteritis and colitis, unspecified: Secondary | ICD-10-CM

## 2012-08-20 DIAGNOSIS — J111 Influenza due to unidentified influenza virus with other respiratory manifestations: Secondary | ICD-10-CM

## 2012-08-20 DIAGNOSIS — K5289 Other specified noninfective gastroenteritis and colitis: Secondary | ICD-10-CM

## 2012-08-20 MED ORDER — ONDANSETRON 4 MG PO TBDP
8.0000 mg | ORAL_TABLET | Freq: Once | ORAL | Status: AC
  Administered 2012-08-20: 8 mg via ORAL

## 2012-08-20 MED ORDER — ONDANSETRON HCL 4 MG PO TABS: 4.0000 mg | ORAL_TABLET | Freq: Four times a day (QID) | ORAL | Status: DC

## 2012-08-20 MED ORDER — ONDANSETRON 4 MG PO TBDP
ORAL_TABLET | ORAL | Status: AC
  Filled 2012-08-20: qty 2

## 2012-08-20 NOTE — ED Provider Notes (Signed)
History     CSN: [id number]  Arrival date & time 08/20/12  1015   First MD Initiated Contact with Patient 08/20/12 1034      Chief Complaint  Patient presents with  . URI    (Consider location/radiation/quality/duration/timing/severity/associated sxs/prior treatment) Patient is a 72 y.o. male presenting with URI. The history is provided by the patient and a relative.  URI The primary symptoms include fever, nausea, vomiting and myalgias. Primary symptoms do not include sore throat, cough, wheezing, abdominal pain or rash. The current episode started yesterday. This is a new problem. The problem has been gradually improving.  Symptoms associated with the illness include congestion. The illness is not associated with chills. Risk factors for severe complications from URI include being elderly.    Past Medical History  Diagnosis Date  . B12 deficiency     on monthly injections  . Headache     spinal tap as OP   . Chest pain     2006 and 2011 ( neg nuclear stress test 2004) MI R/Oed  . Prostate nodule 2000    resolved w/o treatement; Dr Annabell Howells  . Elevated PSA     due to prostatis  . Hyperlipidemia     NMR Lipoprofile 2005, LDL 125 (1309/534), HDL 38, TG 87. LDL Goal=<130. [place] study LDL Goald=<160.   . Elevated homocysteine   . Tubular adenoma     Dr Jarold Motto  . Arthritis   . Anxiety   . Anemia     Past Surgical History  Procedure Date  . Rectal fistula repair 07/2004  . Rotator cuff repair 2007    Dr.Jeff Beane  . Prostate biopsy 2000    Dr.Wrenn  . Knee arthroscopy 09/22/2010    left  . Abdominal hernia repair 11/10/2010    mesh  . Knee arthroscopy 01/02/2011    right  . Replacement total knee 02/10/2011    right  . Colonoscopy w/ polypectomy 2001,2007,2013    F/U every 5 years; Dr Jarold Motto    Family History  Problem Relation Age of Onset  . Cancer Mother     ? of primary  . Brain cancer Maternal Aunt   . Heart attack Neg Hx   . Diabetes Neg Hx   .  Stroke Neg Hx   . Crohn's disease Other     grandson    History  Substance Use Topics  . Smoking status: Never Smoker   . Smokeless tobacco: Never Used  . Alcohol Use: No      Review of Systems  Constitutional: Positive for fever. Negative for chills.  HENT: Positive for congestion. Negative for sore throat.   Respiratory: Negative for cough and wheezing.   Gastrointestinal: Positive for nausea, vomiting and diarrhea. Negative for abdominal pain and blood in stool.  Musculoskeletal: Positive for myalgias.  Skin: Negative for rash.    Allergies  Zantac  Home Medications   Current Outpatient Rx  Name  Route  Sig  Dispense  Refill  . NYQUIL PO   Oral   Take by mouth.         . ACETAMINOPHEN ER 650 MG PO TBCR   Oral   Take 650 mg by mouth daily as needed.           . ASPIRIN 81 MG PO TBEC      81 mg daily.           . BD TB SYRINGE 27G X 1/2" 1 ML MISC  USE AS DIRECTED FOR VIT B12 INJ   50 each   5   . VITAMIN D3 1000 UNITS PO CAPS   Oral   Take by mouth daily.         . CYANOCOBALAMIN 1000 MCG/ML IJ SOLN   Intramuscular   Inject 1 mL (1,000 mcg total) into the muscle every 30 (thirty) days.   30 mL   1   . FOLIC ACID 1 MG PO TABS      TAKE ONE TABLET BY MOUTH EVERY DAY   90 tablet   1   . GLUCOSAMINE HCL POWD   Does not apply   by Does not apply route daily. 1 by mouth two times daily         . HYDROCORTISONE 2.5 % EX LOTN   Topical   Apply topically 2 (two) times daily. To affected areas as needed for itching.   59 mL   0   . MULTIVITAMINS PO CAPS   Oral   Take 1 capsule by mouth daily.           [name] MUPIROCIN 2 % EX OINT      Apply two times a day after cleaning umbilicus.          . OMEPRAZOLE MAGNESIUM 20 MG PO TBEC   Oral   Take 20 mg by mouth daily.         [name] ONDANSETRON HCL 4 MG PO TABS   Oral   Take 1 tablet (4 mg total) by mouth every 6 (six) hours. As needed for n/v   8 tablet   0     BP 112/77   Pulse 90  Temp 98.3 F (36.8 C) (Oral)  Resp 17  SpO2 98%  Physical Exam  Nursing note and vitals reviewed. Constitutional: He is oriented to person, place, and time. He appears well-developed and well-nourished. No distress.  HENT:  Head: Normocephalic.  Right Ear: External ear normal.  Left Ear: External ear normal.  Mouth/Throat: Oropharynx is clear and moist.  Eyes: Pupils are equal, round, and reactive to light.  Neck: Normal range of motion. Neck supple.  Cardiovascular: Normal rate, regular rhythm, normal heart sounds and intact distal pulses.   Pulmonary/Chest: Effort normal and breath sounds normal.  Abdominal: Soft. Bowel sounds are normal. There is no tenderness. There is no rebound and no guarding.  Lymphadenopathy:    He has no cervical adenopathy.  Neurological: He is alert and oriented to person, place, and time.  Skin: Skin is warm and dry.    ED Course  Procedures (including critical care time)  Labs Reviewed - No data to display No results found.   1. Influenza-like illness   2. Gastroenteritis, acute       MDM          Linna Hoff, MD 08/20/12 1341

## 2012-08-20 NOTE — ED Notes (Signed)
Reports symptoms since 08/19/12.  Reports symptoms as stomach pain, headache, nausea, vomiting, diarrhea, dizziness.  Reports n/v/d has slowed, no vomiting this am, one small diarrhea stool.  diaainess today, reports fever 101 last night.

## 2012-08-31 ENCOUNTER — Other Ambulatory Visit: Payer: Self-pay

## 2012-10-11 ENCOUNTER — Emergency Department (HOSPITAL_COMMUNITY): Payer: 59

## 2012-10-11 ENCOUNTER — Telehealth: Payer: Self-pay | Admitting: Internal Medicine

## 2012-10-11 ENCOUNTER — Encounter (HOSPITAL_COMMUNITY): Payer: Self-pay | Admitting: *Deleted

## 2012-10-11 ENCOUNTER — Observation Stay (HOSPITAL_COMMUNITY)
Admission: EM | Admit: 2012-10-11 | Discharge: 2012-10-12 | Disposition: A | Discharge status: Home / Self Care | Payer: 59 | Attending: Internal Medicine | Admitting: Internal Medicine

## 2012-10-11 DIAGNOSIS — E785 Hyperlipidemia, unspecified: Secondary | ICD-10-CM | POA: Insufficient documentation

## 2012-10-11 DIAGNOSIS — M5412 Radiculopathy, cervical region: Secondary | ICD-10-CM

## 2012-10-11 DIAGNOSIS — R079 Chest pain, unspecified: Secondary | ICD-10-CM

## 2012-10-11 DIAGNOSIS — E538 Deficiency of other specified B group vitamins: Secondary | ICD-10-CM | POA: Insufficient documentation

## 2012-10-11 DIAGNOSIS — R6884 Jaw pain: Principal | ICD-10-CM

## 2012-10-11 DIAGNOSIS — R7309 Other abnormal glucose: Secondary | ICD-10-CM | POA: Insufficient documentation

## 2012-10-11 DIAGNOSIS — M25519 Pain in unspecified shoulder: Secondary | ICD-10-CM | POA: Insufficient documentation

## 2012-10-11 DIAGNOSIS — R0789 Other chest pain: Secondary | ICD-10-CM | POA: Insufficient documentation

## 2012-10-11 DIAGNOSIS — K219 Gastro-esophageal reflux disease without esophagitis: Secondary | ICD-10-CM

## 2012-10-11 DIAGNOSIS — M25512 Pain in left shoulder: Secondary | ICD-10-CM

## 2012-10-11 LAB — CBC
HCT: 40.5 % (ref 39.0–52.0)
Hemoglobin: 13.4 g/dL (ref 13.0–17.0)
MCHC: 33.8 g/dL (ref 30.0–36.0)
Platelets: 200 10*3/uL (ref 150–400)
Platelets: 208 10*3/uL (ref 150–400)
RBC: 4.39 MIL/uL (ref 4.22–5.81)
RDW: 12.9 % (ref 11.5–15.5)
WBC: 4.5 10*3/uL (ref 4.0–10.5)

## 2012-10-11 LAB — POCT I-STAT, CHEM 8
Chloride: 106 mEq/L (ref 96–112)
Glucose, Bld: 91 mg/dL (ref 70–99)
HCT: 44 % (ref 39.0–52.0)
Hemoglobin: 15 g/dL (ref 13.0–17.0)
Potassium: 3.9 mEq/L (ref 3.5–5.1)
Sodium: 143 mEq/L (ref 135–145)

## 2012-10-11 LAB — CREATININE, SERUM
Creatinine, Ser: 0.81 mg/dL (ref 0.50–1.35)
GFR calc non Af Amer: 87 mL/min — ABNORMAL LOW (ref >90)

## 2012-10-11 LAB — POCT I-STAT TROPONIN I: Troponin i, poc: 0 ng/mL (ref 0.00–0.08)

## 2012-10-11 LAB — PRO B NATRIURETIC PEPTIDE: Pro B Natriuretic peptide (BNP): 57.2 pg/mL (ref 0–125)

## 2012-10-11 MED ORDER — SODIUM CHLORIDE 0.9 % IJ SOLN: 3.0000 mL | Freq: Two times a day (BID) | INTRAMUSCULAR | Status: DC

## 2012-10-11 MED ORDER — ASPIRIN EC 81 MG PO TBEC
81.0000 mg | DELAYED_RELEASE_TABLET | Freq: Every day | ORAL | Status: DC
  Administered 2012-10-12: 81 mg via ORAL
  Filled 2012-10-11: qty 1

## 2012-10-11 MED ORDER — ONDANSETRON HCL 4 MG/2ML IJ SOLN: 4.0000 mg | Freq: Four times a day (QID) | INTRAMUSCULAR | Status: DC | PRN

## 2012-10-11 MED ORDER — ASPIRIN 81 MG PO TBEC: 81.0000 mg | DELAYED_RELEASE_TABLET | Freq: Every morning | ORAL | Status: DC

## 2012-10-11 MED ORDER — ADULT MULTIVITAMIN W/MINERALS CH
1.0000 | ORAL_TABLET | Freq: Every day | ORAL | Status: DC
Start: 2012-10-12 — End: 2012-10-12
  Administered 2012-10-12: 1 via ORAL
  Filled 2012-10-11: qty 1

## 2012-10-11 MED ORDER — ONDANSETRON HCL 4 MG PO TABS: 4.0000 mg | ORAL_TABLET | Freq: Four times a day (QID) | ORAL | Status: DC | PRN

## 2012-10-11 MED ORDER — FOLIC ACID 1 MG PO TABS
1.0000 mg | ORAL_TABLET | Freq: Every morning | ORAL | Status: DC
  Administered 2012-10-12: 1 mg via ORAL
  Filled 2012-10-11: qty 1

## 2012-10-11 MED ORDER — OXYCODONE HCL 5 MG PO TABS: 5.0000 mg | ORAL_TABLET | ORAL | Status: DC | PRN

## 2012-10-11 MED ORDER — ENOXAPARIN SODIUM 40 MG/0.4ML ~~LOC~~ SOLN
40.0000 mg | SUBCUTANEOUS | Status: DC
  Filled 2012-10-11 (×2): qty 0.4

## 2012-10-11 MED ORDER — SENNOSIDES-DOCUSATE SODIUM 8.6-50 MG PO TABS
1.0000 | ORAL_TABLET | Freq: Every evening | ORAL | Status: DC | PRN
  Filled 2012-10-11: qty 1

## 2012-10-11 MED ORDER — ASPIRIN 325 MG PO TABS
325.0000 mg | ORAL_TABLET | Freq: Once | ORAL | Status: AC
  Administered 2012-10-11: 325 mg via ORAL
  Filled 2012-10-11: qty 1

## 2012-10-11 MED ORDER — ACETAMINOPHEN 650 MG RE SUPP: 650.0000 mg | Freq: Four times a day (QID) | RECTAL | Status: DC | PRN

## 2012-10-11 MED ORDER — MULTIVITAMINS PO CAPS: 1.0000 | ORAL_CAPSULE | Freq: Every morning | ORAL | Status: DC

## 2012-10-11 MED ORDER — SODIUM CHLORIDE 0.9 % IJ SOLN: 3.0000 mL | INTRAMUSCULAR | Status: DC | PRN

## 2012-10-11 MED ORDER — SODIUM CHLORIDE 0.9 % IV SOLN: 250.0000 mL | INTRAVENOUS | Status: DC | PRN

## 2012-10-11 MED ORDER — ACETAMINOPHEN 325 MG PO TABS: 650.0000 mg | ORAL_TABLET | Freq: Four times a day (QID) | ORAL | Status: DC | PRN

## 2012-10-11 NOTE — ED Provider Notes (Signed)
Medical screening examination/treatment/procedure(s) were conducted as a shared visit with non-physician practitioner(s) and myself.  I personally evaluated the patient during the encounter  Patient is a difficult historian. However he has had a pain in his left jaw to the left chest towards the left shoulder since about 4:00 this morning it finally resolved completely despite early historical information at about 2:00 this afternoon. He called his primary care doctor who is with [facility] he was requested to come in appropriately. Patient has no past cardiac history however this pain is unusual and unexplained. He has had a nuclear stress test in 2011 or possibly 2004 the record is confusing on that. Recommended this time patient had aspirin and formal admission by the hospitalist. There is one other issue in that T. his point care troponin results are currently missing if we cannot find them we'll have to do a record troponin before we can call for the admission. The patient should be admitted for a formal chest pain rule out.    Shelda Jakes, MD 10/11/12 [phone]

## 2012-10-11 NOTE — ED Notes (Signed)
EDP in room at this time 

## 2012-10-11 NOTE — ED Provider Notes (Addendum)
History     CSN: [id number]  Arrival date & time 10/11/12  1135   First MD Initiated Contact with Patient 10/11/12 1338      Chief Complaint  Patient presents with  . Jaw Pain  . Shoulder Pain    (Consider location/radiation/quality/duration/timing/severity/associated sxs/prior treatment) Patient is a 72 y.o. male presenting with shoulder pain.  Shoulder Pain Pertinent negatives include no abdominal pain, arthralgias, chest pain, chills, coughing, fever, headaches, myalgias, rash or vomiting.    Adrian Burke is a 72 year old male who presents to the emergency department with chief complaint of left jaw and shoulder pain.  Patient states he awoke this morning and noticed achy pain that he rates at 4/10 under his left jaw and radiating into his left shoulder.  Denies sleeping in an awkward position, he denies any paresthesia of the hand, nausea, vomiting, diaphoresis or shortness of breath. Patient states that the pain is not increased with movement. He denies a history of arthritic change in the neck.  She does have a history of "rotator cuff problems" in the left shoulder, but states that this pain is different.  Patient denies any current pain or symptoms.  Patient states his symptoms lasted for approximately 20 minutes.  When he arrived at work he was encouraged to come to the emergency department for evaluation.  The patient has negative stress test in 2006 as well as 2011. Patient has no history of smoking, hypertension, obesity, diabetes.  He has no family history of stroke or heart attack.    Past Medical History  Diagnosis Date  . B12 deficiency     on monthly injections  . Headache     spinal tap as OP   . Chest pain     2006 and 2011 ( neg nuclear stress test 2004) MI R/Oed  . Prostate nodule 2000    resolved w/o treatement; Dr Annabell Howells  . Elevated PSA     due to prostatis  . Hyperlipidemia     NMR Lipoprofile 2005, LDL 125 (1309/534), HDL 38, TG 87. LDL Goal=<130.  [place] study LDL Goald=<160.   . Elevated homocysteine   . Tubular adenoma     Dr Jarold Motto  . Arthritis   . Anxiety   . Anemia     Past Surgical History  Procedure Laterality Date  . Rectal fistula repair  07/2004  . Rotator cuff repair  2007    Dr.Jeff Beane  . Prostate biopsy  2000    Dr.Wrenn  . Knee arthroscopy  09/22/2010    left  . Abdominal hernia repair  11/10/2010    mesh  . Knee arthroscopy  01/02/2011    right  . Replacement total knee  02/10/2011    right  . Colonoscopy w/ polypectomy  2001,2007,2013    F/U every 5 years; Dr Jarold Motto  . Hernia repair      Family History  Problem Relation Age of Onset  . Cancer Mother     ? of primary  . Brain cancer Maternal Aunt   . Heart attack Neg Hx   . Diabetes Neg Hx   . Stroke Neg Hx   . Crohn's disease Other     grandson    History  Substance Use Topics  . Smoking status: Never Smoker   . Smokeless tobacco: Never Used  . Alcohol Use: No      Review of Systems  Constitutional: Negative for fever and chills.  Respiratory: Negative for cough and shortness of breath.  Cardiovascular: Negative for chest pain and palpitations.  Gastrointestinal: Negative for vomiting, abdominal pain, diarrhea and constipation.  Genitourinary: Negative for dysuria, urgency and frequency.  Musculoskeletal: Negative for myalgias and arthralgias.  Skin: Negative for rash.  Neurological: Negative for headaches.    Allergies  Zantac  Home Medications   Current Outpatient Rx  Name  Route  Sig  Dispense  Refill  . acetaminophen (TYLENOL) 650 MG CR tablet   Oral   Take 650 mg by mouth daily as needed for pain.          [name] aspirin 81 MG EC tablet   Oral   Take 81 mg by mouth every morning.          . Cholecalciferol (VITAMIN D3) 1000 UNITS CAPS   Oral   Take 1,000 Units by mouth every morning.          . cyanocobalamin (,VITAMIN B-12,) 1000 MCG/ML injection   Intramuscular   Inject 1,000 mcg into the muscle  every 30 (thirty) days. Last injection date 10/02/2012         . folic acid (FOLVITE) 1 MG tablet   Oral   Take 1 mg by mouth every morning.         [name] GLUCOSAMINE PO   Oral   Take 1 tablet by mouth 2 (two) times daily.         . Multiple Vitamin (MULTIVITAMIN) capsule   Oral   Take 1 capsule by mouth every morning.          . mupirocin ointment (BACTROBAN) 2 %   Topical   Apply 1 application topically 2 (two) times daily as needed (for umbilicus).         . Pseudoeph-Doxylamine-DM-APAP (NYQUIL PO)   Oral   Take 1 tablet by mouth 2 (two) times daily as needed (for cold symptoms).          . B-D TB SYRINGE 1CC/27GX1/2" 27G X 1/2" 1 ML MISC      USE AS DIRECTED FOR VIT B12 INJ   50 each   5     BP 125/83  Pulse 65  Temp(Src) 98.1 F (36.7 C) (Oral)  Resp 16  Ht 5\' 9"  (1.753 m)  Wt 200 lb (90.719 kg)  BMI 29.52 kg/m2  SpO2 95%  Physical Exam  Nursing note and vitals reviewed. Constitutional: He appears well-developed and well-nourished. No distress.  HENT:  Head: Normocephalic and atraumatic.  Eyes: Conjunctivae are normal. No scleral icterus.  Neck: Normal range of motion. Neck supple.  Cardiovascular: Normal rate, regular rhythm and normal heart sounds.   Pulmonary/Chest: Effort normal and breath sounds normal. No respiratory distress.  Abdominal: Soft. There is no tenderness.  Musculoskeletal: He exhibits no edema.  Neurological: He is alert.  Skin: Skin is warm and dry. He is not diaphoretic.  Psychiatric: His behavior is normal.    ED Course  Procedures (including critical care time)  Labs Reviewed  CBC  POCT I-STAT TROPONIN I   Dg Chest 2 View  10/11/2012  *RADIOLOGY REPORT*  Clinical Data: Chest pain  CHEST - 2 VIEW  Comparison: 02/03/2011  Findings: The heart and pulmonary vascularity are within normal limits.  The lungs are clear bilaterally.  No bony abnormality is seen.  IMPRESSION: No acute abnormality noted.   Original Report  Authenticated By: Alcide Clever, M.D.     Date: 10/11/2012  Rate: 73   Rhythm: normal sinus rhythm  QRS Axis: normal  Intervals: normal  ST/T Wave abnormalities: normal  Conduction Disutrbances:none  Narrative Interpretation:   Old EKG Reviewed: unchanged    1. Jaw pain   2. Shoulder pain, left   3. Chest pain   4. HYPERLIPIDEMIA   5. GERD   6. CERVICAL RADICULOPATHY, LEFT       MDM  BP 125/83  Pulse 65  Temp(Src) 98.1 F (36.7 C) (Oral)  Resp 16  Ht 5\' 9"  (1.753 m)  Wt 200 lb (90.719 kg)  BMI 29.52 kg/m2  SpO2 95% Patient with complaint of aching in the left shoulder and jaw.  His pain has resolved and has not recurred since before arrival at the emergency department. His risk factor for MI are very low other than advanced age.     4:46 PM Filed Vitals:   10/11/12 1155 10/11/12 1400 10/11/12 1445  BP: 125/83 107/59 115/65  Pulse: 65 59 70  Temp: 98.1 F (36.7 C)    TempSrc: Oral    Resp: 16 17 18   Height: 5\' 9"  (1.753 m)    Weight: 200 lb (90.719 kg)    SpO2: 95% 99% 99%   Patient seen in shared visit with Dr. Deretha Emory who illicits a different History from the patient who now states that his pain did not completely resolve until 2PM  The patient will need delta troponins, and although the patient's risk factors are low, His symptoms are quite concerning for MI. Patient has received ASA 325. I have spoken With Dr. Deretha Emory who agrees with my plan   I have spoken with Dr. Ardyth Harps who has agreed to admit the patient for chest pain r/o. The patient appears reasonably stabilized for admission considering the current resources, flow, and capabilities available in the ED at this time, and I doubt any other [facility] requiring further screening and/or treatment in the ED prior to admission.    Arthor Captain, PA-C 10/11/12 1719  Arthor Captain, PA-C 10/28/12 [phone]

## 2012-10-11 NOTE — H&P (Signed)
[facility]          History and Physical    PCP:   Marga Melnick, MD   Chief Complaint:  Left jaw and left shoulder pain  HPI: Pleasant 72 y/o man with PMH only significant for hyperlipidemia. He states he awoke this am around 4 with pain over left jaw and left shoulder with some radiation into the chest. CP was unresolved until 2 pm. He called his PCP that advised he come to the ED for evaluation. Troponin is negative, EKG without acute abnormality. We have been asked to admit him for a CP rule out. Denies CP, palpitations, lightheadedness.  Allergies:   Allergies  Allergen Reactions  . Zantac (Ranitidine Hcl)     rash      Past Medical History  Diagnosis Date  . B12 deficiency     on monthly injections  . Headache     spinal tap as OP   . Chest pain     2006 and 2011 ( neg nuclear stress test 2004) MI R/Oed  . Prostate nodule 2000    resolved w/o treatement; Dr Annabell Howells  . Elevated PSA     due to prostatis  . Hyperlipidemia     NMR Lipoprofile 2005, LDL 125 (1309/534), HDL 38, TG 87. LDL Goal=<130. [place] study LDL Goald=<160.   . Elevated homocysteine   . Tubular adenoma     Dr Jarold Motto  . Arthritis   . Anxiety   . Anemia     Past Surgical History  Procedure Laterality Date  . Rectal fistula repair  07/2004  . Rotator cuff repair  2007    Dr.Jeff Beane  . Prostate biopsy  2000    Dr.Wrenn  . Knee arthroscopy  09/22/2010    left  . Abdominal hernia repair  11/10/2010    mesh  . Knee arthroscopy  01/02/2011    right  . Replacement total knee  02/10/2011    right  . Colonoscopy w/ polypectomy  2001,2007,2013    F/U every 5 years; Dr Jarold Motto  . Hernia repair      Prior to Admission medications   Medication Sig Start Date End Date Taking? Authorizing Provider  acetaminophen (TYLENOL) 650 MG CR tablet Take 650 mg by mouth daily as needed for pain.    Yes Historical Provider, MD  aspirin 81 MG EC tablet Take 81 mg by mouth every  morning.    Yes Historical Provider, MD  Cholecalciferol (VITAMIN D3) 1000 UNITS CAPS Take 1,000 Units by mouth every morning.    Yes Historical Provider, MD  cyanocobalamin (,VITAMIN B-12,) 1000 MCG/ML injection Inject 1,000 mcg into the muscle every 30 (thirty) days. Last injection date 10/02/2012   Yes Historical Provider, MD  folic acid (FOLVITE) 1 MG tablet Take 1 mg by mouth every morning.   Yes Historical Provider, MD  GLUCOSAMINE PO Take 1 tablet by mouth 2 (two) times daily.   Yes Historical Provider, MD  Multiple Vitamin (MULTIVITAMIN) capsule Take 1 capsule by mouth every morning.    Yes Historical Provider, MD  mupirocin ointment (BACTROBAN) 2 % Apply 1 application topically 2 (two) times daily as needed (for umbilicus).   Yes Historical Provider, MD  Pseudoeph-Doxylamine-DM-APAP (NYQUIL PO) Take 1 tablet by mouth 2 (two) times daily as needed (for cold symptoms).    Yes Historical Provider, MD  B-D TB SYRINGE 1CC/27GX1/2" 27G X 1/2" 1 ML MISC USE AS DIRECTED FOR VIT B12 INJ 06/18/12   Pecola Lawless,  MD    Social History:  reports that he has never smoked. He has never used smokeless tobacco. He reports that he does not drink alcohol or use illicit drugs.  Family History  Problem Relation Age of Onset  . Cancer Mother     ? of primary  . Brain cancer Maternal Aunt   . Heart attack Neg Hx   . Diabetes Neg Hx   . Stroke Neg Hx   . Crohn's disease Other     grandson    Review of Systems:  Negative except as mentioned in HPI.   Physical Exam: Blood pressure 137/91, pulse 84, temperature 98.1 F (36.7 C), temperature source Oral, resp. rate 19, height 5\' 9"  (1.753 m), weight 90.719 kg (200 lb), SpO2 96.00%. Gen: AA Ox3 HEENT: NS/AT/PERRL Neck: supple, no JVD, no LAD, no bruits, no goiter CV: RRR, no M/R/G Lungs: CTA B ABd: S/NT/ND/+BS/no masses or organomegaly noted Ext: no C/C/E Neuro: grossly intact and non-focal.  Labs on Admission:  Results for orders placed  during the hospital encounter of 10/11/12 (from the past 48 hour(s))  CBC     Status: None   Collection Time    10/11/12 12:37 PM      Result Value Range   WBC 4.5  4.0 - 10.5 K/uL   RBC 4.39  4.22 - 5.81 MIL/uL   Hemoglobin 13.4  13.0 - 17.0 g/dL   HCT 10.2  72.5 - 36.6 %   MCV 89.3  78.0 - 100.0 fL   MCH 30.5  26.0 - 34.0 pg   MCHC 34.2  30.0 - 36.0 g/dL   RDW 44.0  34.7 - 42.5 %   Platelets 200  150 - 400 K/uL  POCT I-STAT TROPONIN I     Status: None   Collection Time    10/11/12 12:56 PM      Result Value Range   Troponin i, poc 0.00  0.00 - 0.08 ng/mL   Comment 3            Comment: Due to the release kinetics of cTnI,     a negative result within the first hours     of the onset of symptoms does not rule out     myocardial infarction with certainty.     If myocardial infarction is still suspected,     repeat the test at appropriate intervals.  POCT I-STAT, CHEM 8     Status: None   Collection Time    10/11/12  4:44 PM      Result Value Range   Sodium 143  135 - 145 mEq/L   Potassium 3.9  3.5 - 5.1 mEq/L   Chloride 106  96 - 112 mEq/L   BUN 10  6 - 23 mg/dL   Creatinine, Ser 9.56  0.50 - 1.35 mg/dL   Glucose, Bld 91  70 - 99 mg/dL   Calcium, Ion 3.87  5.64 - 1.30 mmol/L   TCO2 28  0 - 100 mmol/L   Hemoglobin 15.0  13.0 - 17.0 g/dL   HCT 33.2  95.1 - 88.4 %    Radiological Exams on Admission: Dg Chest 2 View  10/11/2012  *RADIOLOGY REPORT*  Clinical Data: Chest pain  CHEST - 2 VIEW  Comparison: 02/03/2011  Findings: The heart and pulmonary vascularity are within normal limits.  The lungs are clear bilaterally.  No bony abnormality is seen.  IMPRESSION: No acute abnormality noted.   Original Report Authenticated By: Alcide Clever,  M.D.     Assessment/Plan Principal Problem:   Jaw pain Active Problems:   HYPERLIPIDEMIA   Chest pain    Left Jaw/Shoulder/Chest Pain -Could be an atypical ACS presentation. -His only risk factors for ACS are age and  hyperlipidemia. -Will check A1c to further risk stratify. -Check ECHO. -Admit to telemetry, cycle troponins. -EKG without acute ischemic changes. -Continue ASA 81 mg daily. -If he rules out, I would consider discharging him home in the am. -He could benefit from an OP stress test.  Hyperlipidemia -Check FLP.  DVT Prophylaxis -Lovenox.   Time Spent on Admission: 75 minutes.  Chaya Jan [facility] Pager: [phone] 10/11/2012, 6:33 PM

## 2012-10-11 NOTE — ED Notes (Signed)
Pt c/o L jaw pain that radiates down to L arm (states arm feels heavy).  Called pcp and stated to come here.  Denies cardiac hx.  Non-diaphoretic and no sob.

## 2012-10-11 NOTE — Telephone Encounter (Signed)
Patient sent to ER, [facility]

## 2012-10-11 NOTE — ED Notes (Signed)
Floor RN to call for report

## 2012-10-11 NOTE — Telephone Encounter (Signed)
Patient Information:  Caller Name: Jerret  Phone: [phone]  Patient: Adrian Burke, Adrian Burke  Gender: Male  DOB: 03/21/1941  Age: 72 Years  PCP: Marga Melnick  Office Follow Up:  Does the office need to follow up with this patient?: Yes  Instructions For The Office: OFFICE REQUESTING HEADS UP WHEN SENDING- OFFICE PLEASE NOTE PT SENT TO ED  RN Note:  Instructed for another adult to drive and go to ED now; will comply and call wife; [facility]; denies contraindication to ASA- instructed to take 325mg  tablet now  Symptoms  Reason For Call & Symptoms: EMERGENT CALL:  Pt is calling and states that he is having discomfort to his left shoulder area that started around 4:00am on 10/11/12; also having left jaw pain; denies any chest pain; denies hx of heart problems; no injury to shoulder; pt thinks that it is stress; denies SOB; no sweating; no nausea; rates 3-4 /10; pt is at work now;  Reviewed Health History In EMR: Yes  Reviewed Medications In EMR: Yes  Reviewed Allergies In EMR: Yes  Reviewed Surgeries / Procedures: Yes  Date of Onset of Symptoms: 10/11/2012  Guideline(s) Used:  Shoulder Pain  Disposition Per Guideline:   Go to ED Now  Reason For Disposition Reached:   Age > 40 and associated chest or jaw pain and pain lasting > 5 minutes  Advice Given:  N/A  Patient Will Follow Care Advice:  YES

## 2012-10-12 ENCOUNTER — Encounter (HOSPITAL_COMMUNITY): Payer: Self-pay | Admitting: *Deleted

## 2012-10-12 DIAGNOSIS — M25519 Pain in unspecified shoulder: Secondary | ICD-10-CM

## 2012-10-12 DIAGNOSIS — K219 Gastro-esophageal reflux disease without esophagitis: Secondary | ICD-10-CM

## 2012-10-12 DIAGNOSIS — I519 Heart disease, unspecified: Secondary | ICD-10-CM

## 2012-10-12 DIAGNOSIS — E785 Hyperlipidemia, unspecified: Secondary | ICD-10-CM

## 2012-10-12 LAB — HEMOGLOBIN A1C
Hgb A1c MFr Bld: 5.5 % (ref <5.7)
Mean Plasma Glucose: 111 mg/dL (ref <117)

## 2012-10-12 LAB — BASIC METABOLIC PANEL
BUN: 13 mg/dL (ref 6–23)
Chloride: 105 mEq/L (ref 96–112)
Creatinine, Ser: 0.95 mg/dL (ref 0.50–1.35)
GFR calc Af Amer: >90 mL/min (ref >90)

## 2012-10-12 LAB — CBC
HCT: 37.8 % — ABNORMAL LOW (ref 39.0–52.0)
MCHC: 34.1 g/dL (ref 30.0–36.0)
MCV: 90 fL (ref 78.0–100.0)
RDW: 13 % (ref 11.5–15.5)

## 2012-10-12 NOTE — Discharge Summary (Signed)
Physician Discharge Summary  YUSIF GNAU WUJ:[id number] DOB: July 05, 1941 DOA: 10/11/2012  PCP: Marga Melnick, MD  Admit date: 10/11/2012 Discharge date: 10/12/2012  Time spent: >30 minutes  Recommendations for Outpatient Follow-up:  1. Follow 2-D echo results and arranged appointment with cardiology service for outpatient stress test   Discharge Diagnoses:  Principal Problem:   Jaw pain Active Problems:   HYPERLIPIDEMIA   Chest pain Hyperglycemia  Discharge Condition: stable and improved. No further episodes of CP, shoulder pain or Jaw pain. Cardiac enzymes negative and no abnormalities seen on EKG or telemetry.  Diet recommendation: low fat/heart healthy diet  Filed Weights   10/11/12 1155 10/11/12 1903  Weight: 90.719 kg (200 lb) 89.5 kg (197 lb 5 oz)    History of present illness:  72 y/o man with PMH only significant for hyperlipidemia. He states he awoke this am around 4 with pain over left jaw and left shoulder with some radiation into the chest. Pain was unresolved until 2 pm. He came to ED for further evaluation; while in ED Troponin negative X1, EKG without acute ischemic abnormality and patient pain free. [facility] called to admit patient for CP rule out.   Hospital Course:  1-Left jaw/Left shoulder and chest pain: atypical presentation for heart problems. CE'z neg X3, no EKG or telemetry abnormalities. Patient will follow with PCP and will benefit of outpatient stress test for stratification. -His pain started on his neck/jaw and travel to his shoulder; had hx of cervical radiculopathy; if pain returns will recommend x-ray/MRI of his neck and shoulder and referral to ortho and/or neurosurgery depending results. -Continue baby ASA daily  2-HLD: continue low fat diet; currently not taking any medications. Follow with PCP for lipid panel and further decision on treatment.  3-B12 deficiency: continue supplementation with monthly injections  4-hyperglycemia: no hx of DM  and A1C 5.5; no treatment needed.  Rest of medical problems remains stable and the plan is to continue current medication regimen and to follow with PCP in 1-2 weeks for further evaluation and treatement.  Procedures: See below for x-ray reports  Consultations:  none  Discharge Exam: Filed Vitals:   10/11/12 1615 10/11/12 1903 10/11/12 2004 10/12/12 0541  BP: 137/91 130/72 120/74 111/69  Pulse: 84 67 60 64  Temp:  97.9 F (36.6 C) 98 F (36.7 C) 98 F (36.7 C)  TempSrc:  Oral Oral Oral  Resp: 19 20 18 18   Height:  5\' 9"  (1.753 m)    Weight:  89.5 kg (197 lb 5 oz)    SpO2: 96% 100% 94% 98%    General: NAD, afebrile, cooperative with examination Cardiovascular: S1 and S2, no rubs, no murmurs, no gallops Respiratory: CTA bilaterally Abdomen: soft, NT, ND, positive BS Neuro: AAOX3, non focal deficit  Discharge Instructions  Discharge Orders   Future Appointments Provider Department Dept Phone   03/04/2013 9:30 AM Pecola Lawless, MD [facility]  [place] [phone]   Future Orders Complete By Expires     Discharge instructions  As directed     Comments:      Arrange follow up with PCP in 1-2 weeks Take medications as prescribed Keep yourself well hydrated Follow a low sodium heart healthy diet        Medication List    TAKE these medications       acetaminophen 650 MG CR tablet  Commonly known as:  TYLENOL  Take 650 mg by mouth daily as needed for pain.  aspirin 81 MG EC tablet  Take 81 mg by mouth every morning.     B-D TB SYRINGE 1CC/27GX1/2" 27G X 1/2" 1 ML Misc  Generic drug:  TUBERCULIN SYR 1CC/27GX1/2"  USE AS DIRECTED FOR VIT B12 INJ     cyanocobalamin 1000 MCG/ML injection  Commonly known as:  (VITAMIN B-12)  Inject 1,000 mcg into the muscle every 30 (thirty) days. Last injection date 10/02/2012     folic acid 1 MG tablet  Commonly known as:  FOLVITE  Take 1 mg by mouth every morning.     GLUCOSAMINE PO  Take 1 tablet  by mouth 2 (two) times daily.     multivitamin capsule  Take 1 capsule by mouth every morning.     mupirocin ointment 2 %  Commonly known as:  BACTROBAN  Apply 1 application topically 2 (two) times daily as needed (for umbilicus).     NYQUIL PO  Take 1 tablet by mouth 2 (two) times daily as needed (for cold symptoms).     Vitamin D3 1000 UNITS Caps  Take 1,000 Units by mouth every morning.           Follow-up Information   Follow up with Marga Melnick, MD. Schedule an appointment as soon as possible for a visit in 2 weeks.   Contact information:   [address] [facility] [address] [place] [address] [phone]        The results of significant diagnostics from this hospitalization (including imaging, microbiology, ancillary and laboratory) are listed below for reference.    Significant Diagnostic Studies: Dg Chest 2 View  10/11/2012  *RADIOLOGY REPORT*  Clinical Data: Chest pain  CHEST - 2 VIEW  Comparison: 02/03/2011  Findings: The heart and pulmonary vascularity are within normal limits.  The lungs are clear bilaterally.  No bony abnormality is seen.  IMPRESSION: No acute abnormality noted.   Original Report Authenticated By: Alcide Clever, M.D.    Labs: Basic Metabolic Panel:  Recent Labs Lab 10/11/12 1644 10/11/12 2053 10/12/12 0500  NA 143  --  141  K 3.9  --  3.8  CL 106  --  105  CO2  --   --  28  GLUCOSE 91  --  110*  BUN 10  --  13  CREATININE 0.90 0.81 0.95  CALCIUM  --   --  9.1   CBC:  Recent Labs Lab 10/11/12 1237 10/11/12 1644 10/11/12 2053 10/12/12 0500  WBC 4.5  --  6.0 5.7  HGB 13.4 15.0 13.7 12.9*  HCT 39.2 44.0 40.5 37.8*  MCV 89.3  --  89.4 90.0  PLT 200  --  208 186   Cardiac Enzymes:  Recent Labs Lab 10/11/12 2053 10/12/12 0118 10/12/12 0722  TROPONINI <0.30 <0.30 <0.30   BNP: BNP (last 3 results)  Recent Labs  10/11/12 2053  PROBNP 57.2    Signed:  Montae Stager  [facility] 10/12/2012,  11:43 AM

## 2012-10-12 NOTE — Progress Notes (Signed)
UR completed 

## 2012-10-12 NOTE — Progress Notes (Signed)
  Echocardiogram 2D Echocardiogram has been performed.  Adrian Burke, Adrian Burke 10/12/2012, 2:30 PM

## 2012-10-14 NOTE — ED Provider Notes (Signed)
Medical screening examination/treatment/procedure(s) were conducted as a shared visit with non-physician practitioner(s) and myself.  I personally evaluated the patient during the encounter   Shelda Jakes, MD 10/14/12 [phone]

## 2012-10-21 ENCOUNTER — Encounter: Payer: Self-pay | Admitting: Internal Medicine

## 2012-10-21 ENCOUNTER — Ambulatory Visit (INDEPENDENT_AMBULATORY_CARE_PROVIDER_SITE_OTHER): Payer: 59 | Admitting: Internal Medicine

## 2012-10-21 VITALS — BP 136/88 | HR 65 | Temp 97.8°F | Wt 202.0 lb

## 2012-10-21 DIAGNOSIS — K2270 Barrett's esophagus without dysplasia: Secondary | ICD-10-CM

## 2012-10-21 DIAGNOSIS — R079 Chest pain, unspecified: Secondary | ICD-10-CM

## 2012-10-21 MED ORDER — OMEPRAZOLE 20 MG PO CPDR: 20.0000 mg | DELAYED_RELEASE_CAPSULE | Freq: Every day | ORAL | Status: DC

## 2012-10-21 NOTE — Assessment & Plan Note (Signed)
He has not been compliant with his protein pump inhibitor; maintenance was emphasized. The triggers for reflux were discussed.

## 2012-10-21 NOTE — Patient Instructions (Addendum)
Reflux of gastric acid may be asymptomatic as this may occur mainly during sleep.The triggers for reflux  include stress; the "aspirin family" ; alcohol; peppermint; and caffeine (coffee, tea, cola, and chocolate). The aspirin family would include aspirin and the nonsteroidal agents such as ibuprofen &  Naproxen. Tylenol would not cause reflux. If having symptoms ; food & drink should be avoided for @ least 2 hours before going to bed.  

## 2012-10-21 NOTE — Progress Notes (Signed)
  Subjective:    Patient ID: Adrian Burke, male    DOB: 1940-10-28, 72 y.o.   MRN: [id number]  HPI He was admitted for atypical chest pain 10/11/12 for observation. The pain occurred at rest and was most severe in the left jaw and left shoulder area. The pain actually woke him up the morning of 3/28. Serial enzymes were negative. EKG was normal.  Since discharge he has had intermittent pressure and needlelike pain on the left without exertion.He rides a stationary bike 5-10 minutes daily w/o symptoms.  Echocardiogram revealed ejection fraction to be 55-60%; trivial mitral regurgitation was present. Right ventricle was mildly dilated ; trivial tricuspid valve regurgitation is also present.  Past medical history &  family history were reviewed. There is no personal or family history of heart disease. He's never smoked    Review of Systems   He continues to have frequent dyspepsia and dysphagia. The latter is especially bad with water. He has no unexplained weight loss or persistent abdominal pain. He does have intermittent rectal bleeding. He denies melena.  Following the endoscopy he was placed on omeprazole; this has not been continued. Endoscopy and 2013 revealed Barrett's esophagus and esophageal stricture. He also had colon polyp removed at that time.  Random glucoses were mildly elevated; A1c was 5.5%. His initial hematocrit was 44; at time of discharge was 37.8     Objective:   Physical Exam General appearance is one of good health and nourishment w/o distress.  Eyes: No conjunctival inflammation or scleral icterus is present.  Oral exam: Dental hygiene is good; lips and gums are healthy appearing.There is no oropharyngeal erythema or exudate noted.   Heart:  Normal rate and regular rhythm. S1 and S2 normal without gallop, murmur, click, rub or other extra sounds     Lungs:Chest clear to auscultation; no wheezes, rhonchi,rales ,or rubs present.No increased work of breathing.    Abdomen: bowel sounds normal, soft and non-tender without masses, organomegaly or hernias noted.  No guarding or rebound   Skin:Warm & dry.  Intact without suspicious lesions or rashes ; no jaundice or tenting  Lymphatic: No lymphadenopathy is noted about the head, neck, axilla.             Assessment & Plan:

## 2012-10-22 ENCOUNTER — Other Ambulatory Visit: Payer: Self-pay | Admitting: Internal Medicine

## 2012-10-29 NOTE — ED Provider Notes (Signed)
Medical screening examination/treatment/procedure(s) were conducted as a shared visit with non-physician practitioner(s) and myself.  I personally evaluated the patient during the encounter   Shelda Jakes, MD 10/29/12 1255

## 2012-11-18 ENCOUNTER — Telehealth: Payer: Self-pay

## 2012-11-18 ENCOUNTER — Encounter (INDEPENDENT_AMBULATORY_CARE_PROVIDER_SITE_OTHER): Payer: 59 | Admitting: Physician Assistant

## 2012-11-18 DIAGNOSIS — R079 Chest pain, unspecified: Secondary | ICD-10-CM

## 2012-11-18 NOTE — Patient Instructions (Addendum)
Adrian Burke WE HAVE CONTACTED DR. HOPPER'S OFFICE TODAY [phone] ABOUT RESCHEDULING YOU FOR A LEXISCAN . Your physician has requested that you have a lexiscan myoview. For further information please visit [email or web address] Please follow instruction sheet, as given.  DR. HOPPER WILL NEED TO GIVE THE OK TO SCHEDULE A LEXISCAN PER SCOTT WEAVER, PAC RECOMMENDATIONS, IF OK DR. HOPPER WILL NEED TO PLACE ORDER AND SCHEDULE YOU FOR AN APPT HERE IN CARDIOLOGY OFFICE FOR THE LEXISCAN TO BE DONE. IF YOU DO NOT HEAR BACK EITHER FROM DR. HOPPER'S OFFICE [phone] OR [facility] [phone] ABOUT TEST BEING SCHEDULED PLEASE CALL DR. HOPPER'S OFFICE

## 2012-11-18 NOTE — Telephone Encounter (Signed)
[facility] called and reported pt is there at office to have a stress test done, but pt states Dr Alwyn Ren told him never to have a stress test done on treadmill d/t knee problems. Pt states he was told to either have stress test w/bicycle or a Lexiscan which would be through their Nuclear medicine department. They will have pt leave and wait for another appt for one of these other procedures. Dr Alwyn Ren please advise and order which ever test you want pt to have.

## 2012-11-18 NOTE — Progress Notes (Deleted)
Exercise Treadmill Test  Pre-Exercise Testing Evaluation Rhythm: {CHL RHYTHM BASELINE EKG FOR ZOX:09604540}  Rate: {CHL RATE BASELINE EKG FOR JWJ:19147829}     Test  Exercise Tolerance Test Ordering MD: Tenny Craw Interpreting MD: Tereso Newcomer, PA-C  Unique Test No: 1  Treadmill:  1  Indication for ETT: chest pain - rule out ischemia  Contraindication to ETT: No   Stress Modality: exercise - treadmill  Cardiac Imaging Performed: non   Protocol: standard Bruce - maximal  Max BP:  ***/***  Max MPHR (bpm): 149 85% MPR (bpm):  127  MPHR obtained (bpm):  *** % MPHR obtained:  ***  Reached 85% MPHR (min:sec):  *** Total Exercise Time (min-sec):  ***  Workload in METS:  *** Borg Scale: ***  Reason ETT Terminated:  {CHL REASON TERMINATED FOR ETT:21021064}    ST Segment Analysis At Rest: {CHL ST SEGMENT AT REST FOR FAO:13086578} With Exercise: {CHL ST SEGMENT WITH EXERCISE FOR ION:62952841}  Other Information Arrhythmia:  {CHL ARRHYTHMIA FOR LKG:40102725} Angina during ETT:  {CHL ANGINA DURING DGU:44034742} Quality of ETT:  {CHL QUALITY OF VZD:63875643}  ETT Interpretation:  {CHL INTERPRETATION FOR PIR:51884166}  Comments: ***  Recommendations: ***

## 2012-11-18 NOTE — Progress Notes (Signed)
This encounter was created in error - please disregard.

## 2012-11-21 ENCOUNTER — Other Ambulatory Visit: Payer: Self-pay | Admitting: Internal Medicine

## 2012-11-21 DIAGNOSIS — R079 Chest pain, unspecified: Secondary | ICD-10-CM

## 2012-11-21 DIAGNOSIS — K2270 Barrett's esophagus without dysplasia: Secondary | ICD-10-CM

## 2012-11-21 NOTE — Telephone Encounter (Signed)
Wrong Dr. Alwyn Ren.  They need to talk to Dr. Jobe Gibbon office

## 2012-11-27 NOTE — Telephone Encounter (Signed)
Dr Marga Melnick has put in a referral for pt.

## 2012-12-25 ENCOUNTER — Institutional Professional Consult (permissible substitution): Payer: 59 | Admitting: Cardiovascular Disease

## 2013-02-12 ENCOUNTER — Encounter: Payer: Self-pay | Admitting: Physician Assistant

## 2013-02-17 ENCOUNTER — Other Ambulatory Visit: Payer: Self-pay | Admitting: *Deleted

## 2013-02-17 DIAGNOSIS — R079 Chest pain, unspecified: Secondary | ICD-10-CM

## 2013-02-17 DIAGNOSIS — K2270 Barrett's esophagus without dysplasia: Secondary | ICD-10-CM

## 2013-02-17 MED ORDER — OMEPRAZOLE 20 MG PO CPDR: 20.0000 mg | DELAYED_RELEASE_CAPSULE | Freq: Every day | ORAL | Status: DC

## 2013-02-17 MED ORDER — FOLIC ACID 1 MG PO TABS: ORAL_TABLET | ORAL | Status: DC

## 2013-02-17 NOTE — Telephone Encounter (Signed)
Rx was filled for folic acid # 90 1 R omeprazole for #90 1 R. 02/17/2013

## 2013-03-04 ENCOUNTER — Encounter: Payer: Self-pay | Admitting: Internal Medicine

## 2013-03-04 ENCOUNTER — Ambulatory Visit (INDEPENDENT_AMBULATORY_CARE_PROVIDER_SITE_OTHER): Payer: 59 | Admitting: Internal Medicine

## 2013-03-04 VITALS — BP 136/88 | HR 82 | Temp 98.3°F | Ht 69.25 in | Wt 206.6 lb

## 2013-03-04 DIAGNOSIS — Z862 Personal history of diseases of the blood and blood-forming organs and certain disorders involving the immune mechanism: Secondary | ICD-10-CM

## 2013-03-04 DIAGNOSIS — K2270 Barrett's esophagus without dysplasia: Secondary | ICD-10-CM

## 2013-03-04 DIAGNOSIS — Z0000 Encounter for general adult medical examination without abnormal findings: Secondary | ICD-10-CM

## 2013-03-04 DIAGNOSIS — Z1331 Encounter for screening for depression: Secondary | ICD-10-CM

## 2013-03-04 DIAGNOSIS — D126 Benign neoplasm of colon, unspecified: Secondary | ICD-10-CM

## 2013-03-04 DIAGNOSIS — E785 Hyperlipidemia, unspecified: Secondary | ICD-10-CM

## 2013-03-04 LAB — BASIC METABOLIC PANEL
BUN: 11 mg/dL (ref 6–23)
Chloride: 105 mEq/L (ref 96–112)
Glucose, Bld: 89 mg/dL (ref 70–99)
Potassium: 4 mEq/L (ref 3.5–5.1)
Sodium: 137 mEq/L (ref 135–145)

## 2013-03-04 LAB — CBC WITH DIFFERENTIAL/PLATELET
Basophils Absolute: 0 10*3/uL (ref 0.0–0.1)
Eosinophils Relative: 3.8 % (ref 0.0–5.0)
HCT: 39.6 % (ref 39.0–52.0)
Hemoglobin: 13.7 g/dL (ref 13.0–17.0)
Lymphs Abs: 1.1 10*3/uL (ref 0.7–4.0)
MCV: 89.6 fl (ref 78.0–100.0)
Monocytes Absolute: 0.3 10*3/uL (ref 0.1–1.0)
Monocytes Relative: 7.1 % (ref 3.0–12.0)
Neutro Abs: 3.1 10*3/uL (ref 1.4–7.7)
Platelets: 230 10*3/uL (ref 150.0–400.0)
RDW: 13.4 % (ref 11.5–14.6)

## 2013-03-04 LAB — LDL CHOLESTEROL, DIRECT: Direct LDL: 144.2 mg/dL

## 2013-03-04 LAB — HEPATIC FUNCTION PANEL
ALT: 19 U/L (ref 0–53)
AST: 25 U/L (ref 0–37)
Albumin: 4.3 g/dL (ref 3.5–5.2)
Total Bilirubin: 0.9 mg/dL (ref 0.3–1.2)

## 2013-03-04 LAB — LIPID PANEL
Cholesterol: 216 mg/dL — ABNORMAL HIGH (ref 0–200)
Total CHOL/HDL Ratio: 5
Triglycerides: 172 mg/dL — ABNORMAL HIGH (ref 0.0–149.0)

## 2013-03-04 NOTE — Patient Instructions (Addendum)
Labs will be sent through My Chart

## 2013-03-04 NOTE — Progress Notes (Signed)
  Subjective:    Patient ID: Adrian Burke, male    DOB: 05-Mar-1941, 72 y.o.   MRN: [id number]  HPI  He is here for a physical;acute issues denied.     Review of Systems  He is on a modified heart healthy diet; he exercises on stationary bike 10-30 minutes 7 times per week without symptoms. Specifically he denies chest pain, palpitations, dyspnea, or claudication. Family history is negative for premature coronary disease. Advanced cholesterol testing reveals his LDL goal was less than 125.      Objective:   Physical Exam Gen.:  well-nourished in appearance. Alert, appropriate and cooperative throughout exam.Appears younger than stated age  Head: Normocephalic without obvious abnormalities  Eyes: No corneal or conjunctival inflammation noted. Pupils equal round reactive to light and accommodation. Fundal exam is benign without hemorrhages, exudate, papilledema. Extraocular motion intact. Vision grossly normal with lenses Ears: External  ear exam reveals no significant lesions or deformities. Some wax on R; L  TM normal. Hearing is grossly normal bilaterally. Nose: External nasal exam reveals no deformity or inflammation. Nasal mucosa are pink and moist. No lesions or exudates noted.   Mouth: Oral mucosa and oropharynx reveal no lesions or exudates. Teeth in good repair.Upper & lower partials Neck: No deformities, masses, or tenderness noted. Range of motion & Thyroid normal. Lungs: Normal respiratory effort; chest expands symmetrically. Lungs are clear to auscultation without rales, wheezes, or increased work of breathing. Heart: Normal rate and rhythm. Normal S1 and S2. No gallop, click, or rub. S 4 w/o murmur. Abdomen: Bowel sounds normal; abdomen soft and nontender. No masses, organomegaly or hernias noted. Genitalia: Genitalia normal except for left varices. Prostate is normal without enlargement, asymmetry, nodularity, or induration.                                   Musculoskeletal/extremities:  Accentuated curvature of upper thoracic  Spine. No clubbing, cyanosis, edema, or significant extremity  deformity noted. Range of motion normal .Tone & strength  Normal. Joints normal . Nail health good. Able to lie down & sit up w/o help. Negative SLR bilaterally Vascular: Carotid, radial artery, dorsalis pedis and  posterior tibial pulses are full and equal. No bruits present. Neurologic: Alert and oriented x3. Deep tendon reflexes symmetrical and normal.         Skin: Intact without suspicious lesions or rashes. Lymph: No cervical, axillary, or inguinal lymphadenopathy present. Psych: Mood and affect are normal. Normally interactive                                                                                        Assessment & Plan:  #1 comprehensive physical exam; no acute findings  Plan: see Orders  & Recommendations

## 2013-03-25 ENCOUNTER — Other Ambulatory Visit: Payer: Self-pay | Admitting: *Deleted

## 2013-03-25 MED ORDER — FOLIC ACID 1 MG PO TABS: ORAL_TABLET | ORAL | Status: DC

## 2013-05-22 ENCOUNTER — Other Ambulatory Visit: Payer: Self-pay

## 2013-08-16 ENCOUNTER — Encounter: Payer: Self-pay | Admitting: Internal Medicine

## 2013-08-18 ENCOUNTER — Other Ambulatory Visit: Payer: Self-pay | Admitting: *Deleted

## 2013-08-18 MED ORDER — CYANOCOBALAMIN 1000 MCG/ML IJ SOLN: 1000.0000 ug | INTRAMUSCULAR | Status: DC

## 2013-08-18 MED ORDER — "TUBERCULIN SYRINGE 27G X 1/2"" 1 ML MISC": Status: DC

## 2013-08-26 ENCOUNTER — Other Ambulatory Visit: Payer: Self-pay | Admitting: Internal Medicine

## 2013-08-27 NOTE — Telephone Encounter (Signed)
Rx sent to the pharmacy by e-script.//AB/CMA 

## 2013-10-07 ENCOUNTER — Telehealth: Payer: Self-pay | Admitting: Internal Medicine

## 2013-10-07 NOTE — Telephone Encounter (Signed)
10/07/2013   Pt is needing a refill on rx cyanocobalamin (,VITAMIN B-12,) 1000 MCG/ML injection and the 1cc TB syrng 27g 1/2" needles.  Pt wants to make sure the short TB needles are ordered.  Please contact after rx has been sent to the [facility] on file.

## 2013-10-08 MED ORDER — "TUBERCULIN SYRINGE 27G X 1/2"" 1 ML MISC": Status: DC

## 2013-10-08 MED ORDER — CYANOCOBALAMIN 1000 MCG/ML IJ SOLN: 1000.0000 ug | INTRAMUSCULAR | Status: DC

## 2013-10-08 NOTE — Telephone Encounter (Signed)
Rx's sent to the pharmacy by e-script.  Pt's wife aware.//AB/CMA

## 2013-10-08 NOTE — Addendum Note (Signed)
Addended by: Harl Bowie on: 10/08/2013 04:22 PM   Modules accepted: Orders

## 2013-10-10 ENCOUNTER — Telehealth: Payer: Self-pay

## 2013-10-10 MED ORDER — CYANOCOBALAMIN 1000 MCG/ML IJ SOLN: 1000.0000 ug | INTRAMUSCULAR | Status: DC

## 2013-10-10 NOTE — Telephone Encounter (Signed)
Phone call from patient stating he picked up his B-12 injection from the pharmacy and was only given 1 ml ($18) when he was normally given 30 ml ($15). I let him know I will fix that and to check back with his pharmacy.

## 2014-01-09 ENCOUNTER — Other Ambulatory Visit (INDEPENDENT_AMBULATORY_CARE_PROVIDER_SITE_OTHER): Payer: 59

## 2014-01-09 ENCOUNTER — Ambulatory Visit (INDEPENDENT_AMBULATORY_CARE_PROVIDER_SITE_OTHER): Payer: 59 | Admitting: Internal Medicine

## 2014-01-09 ENCOUNTER — Encounter: Payer: Self-pay | Admitting: Internal Medicine

## 2014-01-09 VITALS — BP 142/82 | HR 80 | Temp 98.1°F | Ht 68.5 in | Wt 206.2 lb

## 2014-01-09 DIAGNOSIS — M25512 Pain in left shoulder: Secondary | ICD-10-CM

## 2014-01-09 DIAGNOSIS — M25511 Pain in right shoulder: Secondary | ICD-10-CM

## 2014-01-09 DIAGNOSIS — R079 Chest pain, unspecified: Secondary | ICD-10-CM

## 2014-01-09 DIAGNOSIS — M25519 Pain in unspecified shoulder: Secondary | ICD-10-CM

## 2014-01-09 DIAGNOSIS — K2100 Gastro-esophageal reflux disease with esophagitis, without bleeding: Secondary | ICD-10-CM

## 2014-01-09 DIAGNOSIS — R0789 Other chest pain: Secondary | ICD-10-CM

## 2014-01-09 DIAGNOSIS — Z862 Personal history of diseases of the blood and blood-forming organs and certain disorders involving the immune mechanism: Secondary | ICD-10-CM

## 2014-01-09 LAB — CBC WITH DIFFERENTIAL/PLATELET
BASOS ABS: 0.1 10*3/uL (ref 0.0–0.1)
Basophils Relative: 0.9 % (ref 0.0–3.0)
EOS ABS: 0.4 10*3/uL (ref 0.0–0.7)
Eosinophils Relative: 4.9 % (ref 0.0–5.0)
HCT: 42.8 % (ref 39.0–52.0)
Hemoglobin: 14.5 g/dL (ref 13.0–17.0)
LYMPHS PCT: 24.8 % (ref 12.0–46.0)
Lymphs Abs: 1.8 10*3/uL (ref 0.7–4.0)
MCHC: 33.9 g/dL (ref 30.0–36.0)
MCV: 90.1 fl (ref 78.0–100.0)
Monocytes Absolute: 0.6 10*3/uL (ref 0.1–1.0)
Monocytes Relative: 8.3 % (ref 3.0–12.0)
Neutro Abs: 4.5 10*3/uL (ref 1.4–7.7)
Neutrophils Relative %: 61.1 % (ref 43.0–77.0)
Platelets: 274 10*3/uL (ref 150.0–400.0)
RBC: 4.75 Mil/uL (ref 4.22–5.81)
RDW: 13.4 % (ref 11.5–15.5)
WBC: 7.3 10*3/uL (ref 4.0–10.5)

## 2014-01-09 LAB — SEDIMENTATION RATE: Sed Rate: 11 mm/hr (ref 0–22)

## 2014-01-09 NOTE — Progress Notes (Signed)
Pre visit review using our clinic review tool, if applicable. No additional management support is needed unless otherwise documented below in the visit note. 

## 2014-01-09 NOTE — Assessment & Plan Note (Signed)
B12 level 

## 2014-01-09 NOTE — Assessment & Plan Note (Signed)
CBC & dif 

## 2014-01-09 NOTE — Progress Notes (Signed)
Subjective:    Patient ID: Adrian Burke, male    DOB: 1940/08/21, 73 y.o.   MRN: [id number]  HPI  He has had increasing dysphagia over the last several weeks. This is in the context of documented Barrett's esophagus. His last upper endoscopy was in 2013. At that time the esophagus was dilated.  At this time he is having to cut the food up into small bites and chew extra well to prevent frank dysphagia.  He also describes some pain in the throat even when not eating.  He describes pain in the neck , shoulder girdle and upper chest which is nonexertional.  He did have some blood in his stool on one occasion 2 weeks ago which seems to be unrelated to his present symptoms. He has had a history of anal fissure  He has been on B12 shots monthly. He's also been taking folic acid supplements for years      Review of Systems   Chest pain, palpitations, tachycardia, exertional dyspnea, paroxysmal nocturnal dyspnea, claudication or edema are absent.  Unexplained weight loss, abdominal pain,  melena or persistently small caliber stools are denied.       Objective:   Physical Exam Gen.: Healthy and well-nourished in appearance. Alert, appropriate and cooperative throughout exam. Appears younger than stated age  Head: Normocephalic without obvious abnormalities;  pattern alopecia  Eyes: No corneal or conjunctival inflammation noted. Pupils equal round reactive to light and accommodation. Extraocular motion intact. No scleral icterus Ears: External  ear exam reveals no significant lesions or deformities. Wax on R. Nose: External nasal exam reveals no deformity or inflammation. Nasal mucosa are pink and moist. No lesions or exudates noted.   Mouth: Oral mucosa and oropharynx reveal no lesions or exudates. Teeth in good repair. Neck: No deformities, masses, or tenderness noted.  Thyroid normal Lungs: Normal respiratory effort; chest expands symmetrically. Lungs are clear to auscultation  without rales, wheezes, or increased work of breathing. Heart: Normal rate and rhythm. Normal S1 and S2. No gallop, click, or rub.  Abdomen: Bowel sounds normal; abdomen soft and nontender. No masses, organomegaly or hernias noted.                                 Musculoskeletal/extremities: No clubbing, cyanosis, edema, or significant extremity  deformity noted. Range of motion normal .Tone & strength normal. Hand joints normal.Fingernail health good. Able to lie down & sit up w/o help. Negative SLR bilaterally Vascular: Carotid, radial artery, dorsalis pedis and  posterior tibial pulses are full and equal. No bruits present. Neurologic: Alert and oriented x3. Deep tendon reflexes symmetrical and normal.  Gait normal .       Skin: Intact without suspicious lesions or rashes. Lymph: No cervical, axillary,  lymphadenopathy present. Psych: Mood and affect are normal. Normally interactive                                                                                        Assessment & Plan:  #1 GERD with painful dysphagia #2 bilateral shoulder pain #3 atypical rest chest  pain #4 B12 deficiency Plan: see Orders  & Recommendations

## 2014-01-09 NOTE — Progress Notes (Signed)
   Subjective:    Patient ID: Adrian Burke, male    DOB: [birth year], 73 y.o.   MRN: [id number]  HPI The pt reports dysphagia that has been occuring for the past couple of weeks. The pt does have a history of Barrett's confirmed on endoscopy in 2013. At that time the pt also was found to have strictures that were dilated during endoscopy. Today he is having to cut up his food into very small pieces and chew it extra well in order to prevent choking. The pt is also having pain in his throat without swallowing.  Pt is taking prilosec daily.   The pt is also having infraclavicular pain across his chest, into his shoulders bilaterally and intra scapular area. The pain is described as dull. It is intermittent and is not associated with activity, laying down, or food. There is no abdominal pain, N/V, or change in bowels associated. There are no respiratory, cardiac or neurologic symptoms associated with the dysphagia. The pt has been taking aleve daily for the pain which helps. The pain ranges from 1/10-7/10.   Pt has a history of anal fistula. Reports he had a bloody bowel movement x 1, two weeks ago that he does not relate to his current symptoms. He denies melena.   Review of Systems  Constitutional: Negative for fever and chills.  Respiratory: Negative for shortness of breath.   Cardiovascular: Negative for chest pain and palpitations.  Gastrointestinal: Negative for nausea, vomiting, abdominal pain and diarrhea.       No melena   Neurological: Negative for dizziness and light-headedness.      Objective:   Physical Exam Folliculitis on abdomen     Assessment & Plan:  #1 dysphagia; per last GI scope, next step would be esophageal manometry

## 2014-01-09 NOTE — Patient Instructions (Signed)
Your next office appointment will be determined based upon review of your pending labs . Those instructions will be transmitted to you through My Chart. Followup as needed for your acute issue. Please report any significant change in your symptoms.Reflux of gastric acid may be asymptomatic as this may occur mainly during sleep.The triggers for reflux  include stress; the "aspirin family" ; alcohol; peppermint; and caffeine (coffee, tea, cola, and chocolate). The aspirin family would include aspirin and the nonsteroidal agents such as ibuprofen &  Naproxen. Tylenol would not cause reflux. If having symptoms ; food & drink should be avoided for @ least 2 hours before going to bed. The GI referral will be scheduled and you'll be notified of the time. Take the protein pump inhibitor 30 minutes before breakfast and 30 minutes before the evening meal until seen.

## 2014-01-11 DIAGNOSIS — E669 Obesity, unspecified: Secondary | ICD-10-CM | POA: Insufficient documentation

## 2014-01-12 ENCOUNTER — Other Ambulatory Visit: Payer: Self-pay | Admitting: Internal Medicine

## 2014-01-12 LAB — VITAMIN B12: Vitamin B-12: 377 pg/mL (ref 211–911)

## 2014-01-12 MED ORDER — CYANOCOBALAMIN 1000 MCG/ML IJ SOLN: 1000.0000 ug | INTRAMUSCULAR | Status: DC

## 2014-01-26 ENCOUNTER — Encounter: Payer: Self-pay | Admitting: Gastroenterology

## 2014-01-26 ENCOUNTER — Ambulatory Visit (INDEPENDENT_AMBULATORY_CARE_PROVIDER_SITE_OTHER): Payer: 59 | Admitting: Gastroenterology

## 2014-01-26 VITALS — BP 104/70 | HR 80 | Ht 68.0 in | Wt 205.8 lb

## 2014-01-26 DIAGNOSIS — K2270 Barrett's esophagus without dysplasia: Secondary | ICD-10-CM

## 2014-01-26 DIAGNOSIS — Z86010 Personal history of colonic polyps: Secondary | ICD-10-CM

## 2014-01-26 DIAGNOSIS — R079 Chest pain, unspecified: Secondary | ICD-10-CM

## 2014-01-26 DIAGNOSIS — R1310 Dysphagia, unspecified: Secondary | ICD-10-CM

## 2014-01-26 MED ORDER — OMEPRAZOLE 40 MG PO CPDR: 40.0000 mg | DELAYED_RELEASE_CAPSULE | Freq: Two times a day (BID) | ORAL | Status: DC

## 2014-01-26 NOTE — Progress Notes (Signed)
    History of Present Illness: This is a 73 year old male previously followed by Dr. Sharlett Iles with a history of Barrett's esophagus and adenomatous colon polyps. He has had recurrent problems with shoulder pain, anterior chest pain, neck pain, sore throat and occasionally difficulty swallowing. He underwent EGD in September 2013 for dysphagia symptoms.  Current Medications, Allergies, Past Medical History, Past Surgical History, Family History and Social History were reviewed in [employer] record.  Physical Exam: General: Well developed , well nourished, no acute distress Head: Normocephalic and atraumatic Eyes:  sclerae anicteric, EOMI Ears: Normal auditory acuity Mouth: No deformity or lesions Lungs: Clear throughout to auscultation Heart: Regular rate and rhythm; no murmurs, rubs or bruits Abdomen: Soft, non tender and non distended. No masses, hepatosplenomegaly or hernias noted. Normal Bowel sounds Musculoskeletal: Symmetrical with no gross deformities  Pulses:  Normal pulses noted Extremities: No clubbing, cyanosis, edema or deformities noted Neurological: Alert oriented x 4, grossly nonfocal Psychological:  Alert and cooperative. Normal mood and affect  Assessment and Recommendations:  1. Barrett's esophagus, GERD. It is unlikely that his shoulder pain, neck pain, chest pain, sore throat are all GERD related but some symptoms could be related to GERD. Intermittent dysphasia has been present for about 2 years with no etiology noted on prior endoscopy. Intensify antireflux measures. Increase omeprazole to 40 mg twice a day. If his dysphagia symptoms persist consider barium esophagram and/or esophageal manometry. Return office visit in 6-8 weeks. Surveillance EGD recommended at a 3 year interval.   2. Personal history of adenomatous colon polyps. Surveillance colonoscopy recommended in 3 year interval

## 2014-01-26 NOTE — Patient Instructions (Signed)
We have sent the following medications to your pharmacy for you to pick up at your convenience:omeprazole 40 mg to take one tablet by mouth twice daily.   Thank you for choosing me and [facility].  Pricilla Riffle. Dagoberto Ligas., MD., Marval Regal

## 2014-01-29 ENCOUNTER — Other Ambulatory Visit: Payer: Self-pay

## 2014-01-31 ENCOUNTER — Other Ambulatory Visit: Payer: Self-pay | Admitting: Internal Medicine

## 2014-03-05 ENCOUNTER — Encounter: Payer: Self-pay | Admitting: Internal Medicine

## 2014-03-05 ENCOUNTER — Ambulatory Visit (INDEPENDENT_AMBULATORY_CARE_PROVIDER_SITE_OTHER): Payer: 59 | Admitting: Internal Medicine

## 2014-03-05 ENCOUNTER — Other Ambulatory Visit (INDEPENDENT_AMBULATORY_CARE_PROVIDER_SITE_OTHER): Payer: 59

## 2014-03-05 VITALS — BP 120/80 | HR 73 | Temp 98.2°F | Ht 69.0 in | Wt 199.5 lb

## 2014-03-05 DIAGNOSIS — Z0000 Encounter for general adult medical examination without abnormal findings: Secondary | ICD-10-CM

## 2014-03-05 DIAGNOSIS — E785 Hyperlipidemia, unspecified: Secondary | ICD-10-CM

## 2014-03-05 LAB — HEPATIC FUNCTION PANEL
ALT: 28 U/L (ref 0–53)
AST: 28 U/L (ref 0–37)
Albumin: 4.4 g/dL (ref 3.5–5.2)
Alkaline Phosphatase: 70 U/L (ref 39–117)
BILIRUBIN DIRECT: 0.2 mg/dL (ref 0.0–0.3)
BILIRUBIN TOTAL: 1.4 mg/dL — AB (ref 0.2–1.2)
Total Protein: 7.2 g/dL (ref 6.0–8.3)

## 2014-03-05 LAB — BASIC METABOLIC PANEL
BUN: 13 mg/dL (ref 6–23)
CO2: 28 mEq/L (ref 19–32)
Calcium: 9.5 mg/dL (ref 8.4–10.5)
Chloride: 103 mEq/L (ref 96–112)
Creatinine, Ser: 0.9 mg/dL (ref 0.4–1.5)
GFR: 86.89 mL/min (ref >60.00)
Glucose, Bld: 83 mg/dL (ref 70–99)
POTASSIUM: 4 meq/L (ref 3.5–5.1)
Sodium: 139 mEq/L (ref 135–145)

## 2014-03-05 LAB — LIPID PANEL
Cholesterol: 212 mg/dL — ABNORMAL HIGH (ref 0–200)
HDL: 55 mg/dL (ref >39.00)
LDL CALC: 142 mg/dL — AB (ref 0–99)
NonHDL: 157
TRIGLYCERIDES: 77 mg/dL (ref 0.0–149.0)
Total CHOL/HDL Ratio: 4
VLDL: 15.4 mg/dL (ref 0.0–40.0)

## 2014-03-05 LAB — PSA: PSA: 0.62 ng/mL (ref 0.10–4.00)

## 2014-03-05 LAB — TSH: TSH: 1.02 u[IU]/mL (ref 0.35–4.50)

## 2014-03-05 NOTE — Progress Notes (Signed)
Pre visit review using our clinic review tool, if applicable. No additional management support is needed unless otherwise documented below in the visit note. 

## 2014-03-05 NOTE — Progress Notes (Signed)
   Subjective:    Patient ID: KRISTI HYER, male    DOB: 16-Apr-1941, 73 y.o.   MRN: [id number]  HPI  He is here for a physical;acute issues denied.  He is now on omeprazole 40 mg twice a day which has resolved the dysphagia and epigastric pain. In fact he has no active GI symptoms at this time  He is on a modified heart healthy diet. He is active at work (still full time) as walking. Exercise is somewhat limited by his degenerative joint disease of knees.       Review of Systems Unexplained weight loss, abdominal pain, significant dyspepsia, dysphagia, melena, rectal bleeding, or persistently small caliber stools are denied.     Objective:   Physical Exam  Gen.: Healthy and well-nourished in appearance. Alert, appropriate and cooperative throughout exam. Appears younger than stated age  Head: Normocephalic without obvious abnormalities;  pattern alopecia  Eyes: No corneal or conjunctival inflammation noted. Pupils equal round reactive to light and accommodation. Extraocular motion intact.  Ears: External  ear exam reveals no significant lesions or deformities. Canals clear .TMs normal. Hearing is grossly normal bilaterally. Nose: External nasal exam reveals no deformity or inflammation. Nasal mucosa are pink and moist. No lesions or exudates noted.   Mouth: Oral mucosa and oropharynx reveal no lesions or exudates. Teeth in good repair. Neck: No deformities, masses, or tenderness noted. Range of motion & Thyroid normal Lungs: Normal respiratory effort; chest expands symmetrically. Lungs are clear to auscultation without rales, wheezes, or increased work of breathing. Heart: Normal rate and rhythm. Normal S1 and S2. No gallop, click, or rub. No murmur. Abdomen: Bowel sounds normal; abdomen soft and nontender. No masses, organomegaly or hernias noted. Genitalia: Genitalia normal except for left varices. Prostate is slightly asymmetric; L > R, without enlargement,  nodularity, or  induration                                   Musculoskeletal/extremities: No deformity or scoliosis noted of  the thoracic or lumbar spine.  No clubbing, cyanosis, edema, or significant extremity  deformity noted. Range of motion normal .Tone & strength normal. Hand joints normal Fingernail  health good. Crepitus in  knees Able to lie down & sit up w/o help. Negative SLR bilaterally Vascular: Carotid, radial artery, dorsalis pedis and  posterior tibial pulses are full and equal. No bruits present. Neurologic: Alert and oriented x3. Deep tendon reflexes symmetrical and normal.  Gait normal.      Skin: Intact without suspicious lesions or rashes. Lymph: No cervical, axillary, or inguinal lymphadenopathy present. Psych: Mood and affect are normal. Normally interactive                                                                                       Assessment & Plan:  #1 comprehensive physical exam; no acute findings  Plan: see Orders  & Recommendations

## 2014-03-05 NOTE — Assessment & Plan Note (Deleted)
Lipids, LFTs, TSH  

## 2014-03-05 NOTE — Patient Instructions (Signed)
Your next office appointment will be determined based upon review of your pending labs. Those instructions will be transmitted to you through My Chart . 

## 2014-03-25 ENCOUNTER — Encounter: Payer: Self-pay | Admitting: Gastroenterology

## 2014-03-25 ENCOUNTER — Ambulatory Visit (INDEPENDENT_AMBULATORY_CARE_PROVIDER_SITE_OTHER): Payer: 59 | Admitting: Gastroenterology

## 2014-03-25 VITALS — BP 100/70 | HR 72 | Ht 67.5 in | Wt 202.4 lb

## 2014-03-25 DIAGNOSIS — K2270 Barrett's esophagus without dysplasia: Secondary | ICD-10-CM

## 2014-03-25 MED ORDER — OMEPRAZOLE 40 MG PO CPDR: 40.0000 mg | DELAYED_RELEASE_CAPSULE | Freq: Two times a day (BID) | ORAL | Status: DC

## 2014-03-25 NOTE — Patient Instructions (Signed)
We have sent the following prescriptions to your mail in pharmacy: omeprazole.   If you have not heard from your mail in pharmacy within 1 week or if you have not received your medication in the mail, please contact us at [phone] so we may find out why.  Thank you for choosing me and [facility].  Malcolm T. Stark, Jr., MD., FACG  

## 2014-03-25 NOTE — Progress Notes (Signed)
    History of Present Illness: This is a 73 year old male with Barretts. His neck pain, chest pain, intermittent dysphagia have all resolved on omeprazole 40 mg po bid.   Current Medications, Allergies, Past Medical History, Past Surgical History, Family History and Social History were reviewed in [employer] record.  Physical Exam: General: Well developed , well nourished, no acute distress Head: Normocephalic and atraumatic Eyes:  sclerae anicteric, EOMI Ears: Normal auditory acuity Mouth: No deformity or lesions Lungs: Clear throughout to auscultation Heart: Regular rate and rhythm; no murmurs, rubs or bruits Abdomen: Soft, non tender and non distended. No masses, hepatosplenomegaly or hernias noted. Normal Bowel sounds Musculoskeletal: Symmetrical with no gross deformities  Pulses:  Normal pulses noted Extremities: No clubbing, cyanosis, edema or deformities noted Neurological: Alert oriented x 4, grossly nonfocal Psychological:  Alert and cooperative. Normal mood and affect  Assessment and Recommendations:  1. Barrett's esophagus. GERD under control. Continue omeprazole 40 mg twice daily and standard antireflux measures. After 2 more months of twice a day therapy can attempt to resume omeprazole 40 mg daily. Surveillance endoscopy recommended June 2016.

## 2014-05-19 ENCOUNTER — Ambulatory Visit (INDEPENDENT_AMBULATORY_CARE_PROVIDER_SITE_OTHER): Payer: 59 | Admitting: Geriatric Medicine

## 2014-05-19 DIAGNOSIS — E538 Deficiency of other specified B group vitamins: Secondary | ICD-10-CM

## 2014-05-19 MED ORDER — CYANOCOBALAMIN 1000 MCG/ML IJ SOLN
1000.0000 ug | Freq: Once | INTRAMUSCULAR | Status: AC
  Administered 2014-05-19: 1000 ug via INTRAMUSCULAR

## 2014-08-27 ENCOUNTER — Telehealth: Payer: Self-pay

## 2014-08-27 NOTE — Telephone Encounter (Signed)
Called pt//LMOVM need to know if he has had his flu shot. Need information to update chart.

## 2014-10-01 ENCOUNTER — Telehealth: Payer: Self-pay

## 2014-10-01 NOTE — Telephone Encounter (Signed)
Left voice mail for pt to call back if he wants flu vaccine

## 2014-10-01 NOTE — Telephone Encounter (Signed)
Pt declined flu vaccine

## 2014-11-22 ENCOUNTER — Other Ambulatory Visit: Payer: Self-pay | Admitting: Internal Medicine

## 2015-01-12 ENCOUNTER — Other Ambulatory Visit: Payer: Self-pay | Admitting: Internal Medicine

## 2015-01-13 ENCOUNTER — Other Ambulatory Visit: Payer: Self-pay | Admitting: Emergency Medicine

## 2015-01-13 MED ORDER — OMEPRAZOLE 40 MG PO CPDR: 40.0000 mg | DELAYED_RELEASE_CAPSULE | Freq: Two times a day (BID) | ORAL | Status: DC

## 2015-01-13 NOTE — Telephone Encounter (Signed)
Last office visit aug/2015---are you ok with refilling prilosec--please advise, thanks

## 2015-01-13 NOTE — Telephone Encounter (Signed)
OK X1 Last seen 8/15  My retirement date is 07/17/2015; but I will be in office only T, Weds & Thurs during the months Oct-Dec.You should transition your care to another PCP by Oct 1,2016.

## 2015-01-13 NOTE — Telephone Encounter (Signed)
Refill sent, pt will need OV before further refills

## 2015-01-15 ENCOUNTER — Other Ambulatory Visit: Payer: Self-pay | Admitting: Emergency Medicine

## 2015-01-26 ENCOUNTER — Other Ambulatory Visit: Payer: Self-pay | Admitting: Emergency Medicine

## 2015-01-26 MED ORDER — OMEPRAZOLE 20 MG PO CPDR: DELAYED_RELEASE_CAPSULE | ORAL | Status: DC

## 2015-02-18 ENCOUNTER — Other Ambulatory Visit: Payer: Self-pay | Admitting: Emergency Medicine

## 2015-02-18 ENCOUNTER — Encounter: Payer: Self-pay | Admitting: Gastroenterology

## 2015-02-24 ENCOUNTER — Telehealth: Payer: Self-pay | Admitting: Emergency Medicine

## 2015-02-24 NOTE — Telephone Encounter (Signed)
LVM for pt to call back and schedule an appt to get a refill for Omeprazole. Also need to verify how many mg of Omeprazole pt is taking.

## 2015-03-09 ENCOUNTER — Other Ambulatory Visit: Payer: Self-pay | Admitting: Internal Medicine

## 2015-03-09 NOTE — Telephone Encounter (Signed)
Prilosec sent to [facility], no further refills until office visit is scheduled

## 2015-04-02 ENCOUNTER — Ambulatory Visit (AMBULATORY_SURGERY_CENTER): Payer: Self-pay

## 2015-04-02 VITALS — Ht 69.0 in | Wt 206.0 lb

## 2015-04-02 DIAGNOSIS — K2270 Barrett's esophagus without dysplasia: Secondary | ICD-10-CM

## 2015-04-02 NOTE — Progress Notes (Signed)
No egg or soy allergy.  No previous complications from anesthesia. No home O2. No diet meds. 

## 2015-04-06 ENCOUNTER — Ambulatory Visit (INDEPENDENT_AMBULATORY_CARE_PROVIDER_SITE_OTHER): Payer: 59 | Admitting: Internal Medicine

## 2015-04-06 ENCOUNTER — Encounter: Payer: Self-pay | Admitting: Internal Medicine

## 2015-04-06 VITALS — BP 134/88 | HR 85 | Temp 98.3°F | Resp 18 | Wt 206.0 lb

## 2015-04-06 DIAGNOSIS — K604 Rectal fistula: Secondary | ICD-10-CM

## 2015-04-06 DIAGNOSIS — M5416 Radiculopathy, lumbar region: Secondary | ICD-10-CM | POA: Diagnosis not present

## 2015-04-06 MED ORDER — TRAMADOL HCL 50 MG PO TABS: 50.0000 mg | ORAL_TABLET | Freq: Three times a day (TID) | ORAL | Status: DC | PRN

## 2015-04-06 MED ORDER — GABAPENTIN 100 MG PO CAPS: ORAL_CAPSULE | ORAL | Status: DC

## 2015-04-06 NOTE — Progress Notes (Signed)
Pre visit review using our clinic review tool, if applicable. No additional management support is needed unless otherwise documented below in the visit note. 

## 2015-04-06 NOTE — Patient Instructions (Signed)
The best exercises for the low back include freestyle swimming, stretch aerobics, and yoga.Cybex & Nautilus machines rather than dead weights are better for the back. 

## 2015-04-06 NOTE — Progress Notes (Signed)
   Subjective:    Patient ID: Adrian Burke, male    DOB: [birth year], 74 y.o.   MRN: [id number]  HPI He's had low back pain mainly on the right since 9/8. That day he began to have rectal bleeding. He has a known rectal fistula. This resolved by 9/10. The back pain has continued. It is worse by afternoon or with weightbearing. It is described as throbbing.  As of 9/18 he's developed tightness in the right lateral thigh and numbness and tingling from the knee down anteriorly. There is some weakness intermittently in the right lower extremity but no other neuromuscular symptoms.  He has no history of surgery or injury to the back.  He was concerned that he had infection in his knee. Right total knee replacement was done in 2014.  Colonoscopy is up-to-date. He has a follow-up endoscopy 04/16/15 to assess his Barrett's esophagus.   Review of Systems Fever, chills, sweats, or unexplained weight loss not present. No significant headaches. Mental status change or memory loss denied. Blurred vision , diplopia or vision loss absent. Vertigo, near syncope or imbalance denied. No loss of control of bladder or bowels. No seizure stigmata.   Epistaxis, hemoptysis, hematuria,or melena denied. No unexplained weight loss, significant dyspepsia,dysphagia, or abdominal pain.  There is no abnormal bruising or difficulty stopping bleeding with injury.      Objective:   Physical Exam Pertinent or positive findings include: Pattern alopecia is present. He has diffuse from changes in the right knee with some crepitus.  He is able to lie flat and sit up without help. He has negative straight leg raising.Heel and toe walking is completed without difficulty. Deep tendon reflexes, strength, and tone lower extremities are normal to slightly hyperactive.  General appearance :adequately nourished; in no distress.  Eyes: No conjunctival inflammation or scleral icterus is present.  Oral exam:  Lips and gums  are healthy appearing.There is no oropharyngeal erythema or exudate noted.  Heart:  Normal rate and regular rhythm. S1 and S2 normal without gallop, murmur, click, rub or other extra sounds    Lungs:Chest clear to auscultation; no wheezes, rhonchi,rales ,or rubs present.No increased work of breathing.   Abdomen: bowel sounds normal, soft and non-tender without masses, organomegaly or hernias noted.  No guarding or rebound. No flank tenderness to percussion.  Vascular : all pulses equal ; no bruits present.  Skin:Warm & dry.  Intact without suspicious lesions or rashes ; no tenting or jaundice   Lymphatic: No lymphadenopathy is noted about the head, neck, axilla     Assessment & Plan:  #1 rectal fistula, resolved  #2 lumbar radiculopathy  Plan: See orders and recommendations

## 2015-04-07 DIAGNOSIS — M5416 Radiculopathy, lumbar region: Secondary | ICD-10-CM | POA: Insufficient documentation

## 2015-04-08 ENCOUNTER — Ambulatory Visit (INDEPENDENT_AMBULATORY_CARE_PROVIDER_SITE_OTHER): Payer: 59 | Admitting: Internal Medicine

## 2015-04-08 ENCOUNTER — Encounter: Payer: Self-pay | Admitting: Internal Medicine

## 2015-04-08 ENCOUNTER — Other Ambulatory Visit (INDEPENDENT_AMBULATORY_CARE_PROVIDER_SITE_OTHER): Payer: 59

## 2015-04-08 VITALS — BP 124/82 | HR 66 | Temp 98.1°F | Resp 16 | Ht 69.0 in | Wt 206.0 lb

## 2015-04-08 DIAGNOSIS — K2270 Barrett's esophagus without dysplasia: Secondary | ICD-10-CM | POA: Diagnosis not present

## 2015-04-08 DIAGNOSIS — Z862 Personal history of diseases of the blood and blood-forming organs and certain disorders involving the immune mechanism: Secondary | ICD-10-CM | POA: Diagnosis not present

## 2015-04-08 DIAGNOSIS — Z23 Encounter for immunization: Secondary | ICD-10-CM | POA: Diagnosis not present

## 2015-04-08 DIAGNOSIS — K2100 Gastro-esophageal reflux disease with esophagitis, without bleeding: Secondary | ICD-10-CM

## 2015-04-08 DIAGNOSIS — E785 Hyperlipidemia, unspecified: Secondary | ICD-10-CM

## 2015-04-08 LAB — CBC WITH DIFFERENTIAL/PLATELET
BASOS ABS: 0 10*3/uL (ref 0.0–0.1)
Basophils Relative: 0.6 % (ref 0.0–3.0)
Eosinophils Absolute: 0.4 10*3/uL (ref 0.0–0.7)
Eosinophils Relative: 5 % (ref 0.0–5.0)
HCT: 42.2 % (ref 39.0–52.0)
Hemoglobin: 14.3 g/dL (ref 13.0–17.0)
LYMPHS ABS: 1.6 10*3/uL (ref 0.7–4.0)
Lymphocytes Relative: 21.9 % (ref 12.0–46.0)
MCHC: 33.8 g/dL (ref 30.0–36.0)
MCV: 88.9 fl (ref 78.0–100.0)
MONO ABS: 0.5 10*3/uL (ref 0.1–1.0)
Monocytes Relative: 6.7 % (ref 3.0–12.0)
NEUTROS PCT: 65.8 % (ref 43.0–77.0)
Neutro Abs: 4.8 10*3/uL (ref 1.4–7.7)
Platelets: 262 10*3/uL (ref 150.0–400.0)
RBC: 4.75 Mil/uL (ref 4.22–5.81)
RDW: 13.6 % (ref 11.5–15.5)
WBC: 7.3 10*3/uL (ref 4.0–10.5)

## 2015-04-08 LAB — LIPID PANEL
CHOLESTEROL: 204 mg/dL — AB (ref 0–200)
HDL: 48.5 mg/dL (ref >39.00)
LDL CALC: 140 mg/dL — AB (ref 0–99)
NonHDL: 155.6
Total CHOL/HDL Ratio: 4
Triglycerides: 79 mg/dL (ref 0.0–149.0)
VLDL: 15.8 mg/dL (ref 0.0–40.0)

## 2015-04-08 LAB — BASIC METABOLIC PANEL
BUN: 19 mg/dL (ref 6–23)
CALCIUM: 9.6 mg/dL (ref 8.4–10.5)
CO2: 29 mEq/L (ref 19–32)
Chloride: 101 mEq/L (ref 96–112)
Creatinine, Ser: 0.93 mg/dL (ref 0.40–1.50)
GFR: 84.48 mL/min (ref >60.00)
GLUCOSE: 90 mg/dL (ref 70–99)
Potassium: 4 mEq/L (ref 3.5–5.1)
Sodium: 138 mEq/L (ref 135–145)

## 2015-04-08 LAB — HEPATIC FUNCTION PANEL
ALK PHOS: 66 U/L (ref 39–117)
ALT: 17 U/L (ref 0–53)
AST: 19 U/L (ref 0–37)
Albumin: 4.4 g/dL (ref 3.5–5.2)
Bilirubin, Direct: 0.2 mg/dL (ref 0.0–0.3)
Total Bilirubin: 0.9 mg/dL (ref 0.2–1.2)
Total Protein: 7.4 g/dL (ref 6.0–8.3)

## 2015-04-08 LAB — VITAMIN B12: Vitamin B-12: 680 pg/mL (ref 211–911)

## 2015-04-08 LAB — TSH: TSH: 1.34 u[IU]/mL (ref 0.35–4.50)

## 2015-04-08 NOTE — Assessment & Plan Note (Signed)
CBC and differential; B12 level

## 2015-04-08 NOTE — Progress Notes (Signed)
   Subjective:    Patient ID: Adrian Burke, male    DOB: 06-11-1941, 74 y.o.   MRN: [id number]  HPI The patient is here  to assess status of active health conditions.  PMH, FH, & Social History reviewed & updated.No change in [place] as recorded.  He eats red meat regularly as well as fried foods. He does avoid excess salt. He is not exercising.  The rectal bleeding from the fistula has resolved. He believes it may have been related to increased intake of tea.  He is scheduled for an upper endoscopy 05/16/15 by Dr. Fuller Plan because of Barrett's esophagus. He continues to have slight occasional dysphagia even with water at times.  He's been taking his low dose aspirin at bedtime. He's also been taking Aleve for carpal tunnel syndrome.  His lumbar radiculopathy has essentially resolved with the gabapentin at bedtime.   Review of Systems  Chest pain, palpitations, tachycardia, exertional dyspnea, paroxysmal nocturnal dyspnea, claudication or edema are absent. No unexplained weight loss, abdominal pain, significant dyspepsia,  melena, or persistently small caliber stools. Dysuria, pyuria, hematuria, frequency, nocturia or polyuria are denied. Change in hair, skin, nails denied. No bowel changes of constipation or diarrhea. No intolerance to heat or cold.     Objective:   Physical Exam  Pertinent or positive findings include: Pattern alopecia is present. Abdomen is protuberant. He has crepitus of the knees. Rectal exam was deferred because of the recent rectal fissure with bleeding and because his PSA was 0.62 on 03/05/14.  General appearance :adequately nourished; in no distress.  Eyes: No conjunctival inflammation or scleral icterus is present.  Oral exam:  Lips and gums are healthy appearing.There is no oropharyngeal erythema or exudate noted. Dental hygiene is good.  Heart:  Normal rate and regular rhythm. S1 and S2 normal without gallop, murmur, click, rub or other extra sounds     Lungs:Chest clear to auscultation; no wheezes, rhonchi,rales ,or rubs present.No increased work of breathing.   Abdomen: bowel sounds normal, soft and non-tender without masses, organomegaly or hernias noted.  No guarding or rebound.   Vascular : all pulses equal ; no bruits present.  Skin:Warm & dry.  Intact without suspicious lesions or rashes ; no tenting or jaundice   Lymphatic: No lymphadenopathy is noted about the head, neck, axilla.   Neuro: Strength, tone & DTRs normal.      Assessment & Plan:   See Current Assessment & Plan in Problem List under specific Diagnosis

## 2015-04-08 NOTE — Assessment & Plan Note (Signed)
Antireflux measures reviewed  Upper endoscopy 05/16/15 by Dr. Fuller Plan

## 2015-04-08 NOTE — Assessment & Plan Note (Addendum)
Lipids, LFTs, TSH  

## 2015-04-08 NOTE — Progress Notes (Signed)
Pre visit review using our clinic review tool, if applicable. No additional management support is needed unless otherwise documented below in the visit note. 

## 2015-04-08 NOTE — Patient Instructions (Addendum)
  Your next office appointment will be determined based upon review of your pending labs  and  xrays  Those written interpretation of the lab results and instructions will be transmitted to you by My Chart   Critical results will be called.   Followup as needed for any active or acute issue. Please report any significant change in your symptoms.  Reflux of gastric acid may be asymptomatic as this may occur mainly during sleep.The triggers for reflux  include stress; the "aspirin family" ; alcohol; peppermint; and caffeine (coffee, tea, cola, and chocolate). The aspirin family would include aspirin and the nonsteroidal agents such as ibuprofen &  Naproxen. Tylenol would not cause reflux. If having symptoms ; food & drink should be avoided for @ least 2 hours before going to bed.

## 2015-04-16 ENCOUNTER — Ambulatory Visit (AMBULATORY_SURGERY_CENTER): Payer: 59 | Admitting: Gastroenterology

## 2015-04-16 ENCOUNTER — Encounter: Payer: Self-pay | Admitting: Gastroenterology

## 2015-04-16 VITALS — BP 110/72 | HR 60 | Temp 96.9°F | Resp 15 | Ht 69.0 in | Wt 206.0 lb

## 2015-04-16 DIAGNOSIS — K2270 Barrett's esophagus without dysplasia: Secondary | ICD-10-CM | POA: Diagnosis present

## 2015-04-16 MED ORDER — SODIUM CHLORIDE 0.9 % IV SOLN: 500.0000 mL | INTRAVENOUS | Status: DC

## 2015-04-16 MED ORDER — OMEPRAZOLE 40 MG PO CPDR: 40.0000 mg | DELAYED_RELEASE_CAPSULE | Freq: Two times a day (BID) | ORAL | Status: DC

## 2015-04-16 NOTE — Progress Notes (Signed)
Called to room to assist during endoscopic procedure.  Patient ID and intended procedure confirmed with present staff. Received instructions for my participation in the procedure from the performing physician.  

## 2015-04-16 NOTE — Progress Notes (Signed)
Transferred to recovery room. A/O x3, pleased with MAC.  VSS.  Report to Suzanne, RN. 

## 2015-04-16 NOTE — Op Note (Signed)
[place]  [address] [place], [id number]   ENDOSCOPY PROCEDURE REPORT  PATIENT: Adrian, Burke  MR#: [id number] BIRTHDATE: 09/22/1940 , 73  yrs. old GENDER: male ENDOSCOPIST: Ladene Artist, MD, [facility] PROCEDURE DATE:  04/16/2015 PROCEDURE:  EGD w/ biopsy ASA CLASS:     Class II INDICATIONS:  history of Barrett's esophagus. MEDICATIONS: Monitored anesthesia care and Propofol 150 mg IV TOPICAL ANESTHETIC: none DESCRIPTION OF PROCEDURE: After the risks benefits and alternatives of the procedure were thoroughly explained, informed consent was obtained.  The [id number] endoscope was introduced through the mouth and advanced to the second portion of the duodenum , Without limitations.  The instrument was slowly withdrawn as the mucosa was fully examined.    ESOPHAGUS: There was evidence of a short segment of Barrett's esophagus in the distal esophagus. The length of the tongue of Barrett's mucosa was 1cm.  Multiple biopsies were performed.  The esophagus was otherwise unremarkable. STOMACH: The mucosa of the stomach appeared normal. DUODENUM: The duodenal mucosa showed no abnormalities in the bulb and 2nd part of the duodenum.  Retroflexed views revealed a small hiatal hernia.  The scope was then withdrawn from the patient and the procedure completed.  COMPLICATIONS: There were no immediate complications.  ENDOSCOPIC IMPRESSION: 1.   Barrett's esophagus in the distal esophagus; multiple biopsies performed 2.   Small hiatal hernia 3.   The EGD otherwise appeared normal  RECOMMENDATIONS: 1.  Await pathology results 2.  Anti-reflux regimen long term 3.  Follow up EGD for in 3 years if Barretts without dysplasia is noted 4.  PPI bid: omeprazole 40 mg po bid, 1 year of refills  eSigned:  Ladene Artist, MD, [facility] 04/16/2015 2:47 PM

## 2015-04-16 NOTE — Patient Instructions (Addendum)
YOU HAD AN ENDOSCOPIC PROCEDURE TODAY AT THE [facility]:   Refer to the procedure report that was given to you for any specific questions about what was found during the examination.  If the procedure report does not answer your questions, please call your gastroenterologist to clarify.  If you requested that your care partner not be given the details of your procedure findings, then the procedure report has been included in a sealed envelope for you to review at your convenience later.  YOU SHOULD EXPECT: Some feelings of bloating in the abdomen. Passage of more gas than usual.  Walking can help get rid of the air that was put into your GI tract during the procedure and reduce the bloating. If you had a lower endoscopy (such as a colonoscopy or flexible sigmoidoscopy) you may notice spotting of blood in your stool or on the toilet paper. If you underwent a bowel prep for your procedure, you may not have a normal bowel movement for a few days.  Please Note:  You might notice some irritation and congestion in your nose or some drainage.  This is from the oxygen used during your procedure.  There is no need for concern and it should clear up in a day or so.  SYMPTOMS TO REPORT IMMEDIATELY:   Following lower endoscopy (colonoscopy or flexible sigmoidoscopy):  Excessive amounts of blood in the stool  Significant tenderness or worsening of abdominal pains  Swelling of the abdomen that is new, acute  Fever of 100F or higher   Following upper endoscopy (EGD)  Vomiting of blood or coffee ground material  New chest pain or pain under the shoulder blades  Painful or persistently difficult swallowing  New shortness of breath  Fever of 100F or higher  Black, tarry-looking stools  For urgent or emergent issues, a gastroenterologist can be reached at any hour by calling [phone].   DIET: Your first meal following the procedure should be a small meal and then it is ok to progress to  your normal diet. Heavy or fried foods are harder to digest and may make you feel nauseous or bloated.  Likewise, meals heavy in dairy and vegetables can increase bloating.  Drink plenty of fluids but you should avoid alcoholic beverages for 24 hours.  ACTIVITY:  You should plan to take it easy for the rest of today and you should NOT DRIVE or use heavy machinery until tomorrow (because of the sedation medicines used during the test).    FOLLOW UP: Our staff will call the number listed on your records the next business day following your procedure to check on you and address any questions or concerns that you may have regarding the information given to you following your procedure. If we do not reach you, we will leave a message.  However, if you are feeling well and you are not experiencing any problems, there is no need to return our call.  We will assume that you have returned to your regular daily activities without incident.  If any biopsies were taken you will be contacted by phone or by letter within the next 1-3 weeks.  Please call us at [phone] if you have not heard about the biopsies in 3 weeks.    SIGNATURES/CONFIDENTIALITY: You and/or your care partner have signed paperwork which will be entered into your electronic medical record.  These signatures attest to the fact that that the information above on your After Visit Summary has been reviewed   and is understood.  Full responsibility of the confidentiality of this discharge information lies with you and/or your care-partner.  Read all of your handouts given to you by your recovery room nurse.  Your medicine has been faxed to your med company.  If insurance won't pay for it, they will fax [place] a letter to fill out.

## 2015-04-19 ENCOUNTER — Telehealth: Payer: Self-pay | Admitting: *Deleted

## 2015-04-19 NOTE — Telephone Encounter (Signed)
  Follow up Call-  Call back number 04/16/2015  Post procedure Call Back phone  # [phone]  Permission to leave phone message Yes     Patient questions:  Do you have a fever, pain , or abdominal swelling? No. Pain Score  0 *  Have you tolerated food without any problems? Yes.    Have you been able to return to your normal activities? Yes.    Do you have any questions about your discharge instructions: Diet   No. Medications  No. Follow up visit  No.  Do you have questions or concerns about your Care? No.  Actions: * If pain score is 4 or above: No action needed, pain <4.  Information provided via wife,she stated that he was getting ready to walk out the door to go to work.

## 2015-04-21 ENCOUNTER — Encounter: Payer: Self-pay | Admitting: Gastroenterology

## 2015-06-15 ENCOUNTER — Inpatient Hospital Stay: Admit: 2015-06-15 | Payer: Self-pay | Admitting: Neurosurgery

## 2015-06-15 SURGERY — ANTERIOR LATERAL LUMBAR FUSION WITH PERCUTANEOUS SCREW 2 LEVEL
Anesthesia: General | Laterality: Left

## 2015-08-09 ENCOUNTER — Other Ambulatory Visit: Payer: Self-pay | Admitting: Internal Medicine

## 2015-08-09 MED ORDER — "TUBERCULIN SYRINGE 27G X 1/2"" 1 ML MISC": Status: DC

## 2015-08-10 ENCOUNTER — Other Ambulatory Visit: Payer: Self-pay | Admitting: Internal Medicine

## 2015-08-13 ENCOUNTER — Telehealth: Payer: Self-pay | Admitting: Internal Medicine

## 2015-08-13 NOTE — Telephone Encounter (Signed)
Ok with me 

## 2015-08-13 NOTE — Telephone Encounter (Signed)
Please advise 

## 2015-08-13 NOTE — Telephone Encounter (Signed)
Called left pt vm for to call back.

## 2015-08-13 NOTE — Telephone Encounter (Signed)
Pt request to transfer from Dr. Linna Darner to Dr. Jenny Reichmann (since his spouse already a pt of Dr. Jenny Reichmann). Please advise  Pt also stated he need refill for TUBERCULIN SYR 1CC/27GX1/2" (B-D TB SYRINGE 1CC/27GX1/2") 27G X 1/2" 1 ML MISC to be send in to [facility]. This is for his B12. Please help

## 2015-09-25 ENCOUNTER — Emergency Department (INDEPENDENT_AMBULATORY_CARE_PROVIDER_SITE_OTHER)
Admission: EM | Admit: 2015-09-25 | Discharge: 2015-09-25 | Disposition: A | Discharge status: Home / Self Care | Payer: 59 | Source: Home / Self Care | Attending: Emergency Medicine | Admitting: Emergency Medicine

## 2015-09-25 ENCOUNTER — Encounter (HOSPITAL_COMMUNITY): Payer: Self-pay | Admitting: Emergency Medicine

## 2015-09-25 ENCOUNTER — Emergency Department (INDEPENDENT_AMBULATORY_CARE_PROVIDER_SITE_OTHER): Payer: 59

## 2015-09-25 DIAGNOSIS — R0781 Pleurodynia: Secondary | ICD-10-CM | POA: Diagnosis not present

## 2015-09-25 DIAGNOSIS — S2341XA Sprain of ribs, initial encounter: Secondary | ICD-10-CM

## 2015-09-25 DIAGNOSIS — S29011A Strain of muscle and tendon of front wall of thorax, initial encounter: Secondary | ICD-10-CM

## 2015-09-25 MED ORDER — NAPROXEN 375 MG PO TABS: 375.0000 mg | ORAL_TABLET | Freq: Two times a day (BID) | ORAL | Status: DC

## 2015-09-25 NOTE — Discharge Instructions (Signed)

## 2015-09-25 NOTE — ED Provider Notes (Signed)
CSN: [id number]     Arrival date & time 09/25/15  1301 History   First MD Initiated Contact with Patient 09/25/15 1326     Chief Complaint  Patient presents with  . Rib Injury   (Consider location/radiation/quality/duration/timing/severity/associated sxs/prior Treatment) HPI Comments: 75 year old male was lifting a printer to set it in a box last  evening. During that movement he experienced a pain and an acute popping feeling to the left lower anterolateral ribs. He is now complaining of nonradiating pain that is worse with movement and taking a deep breath. Denies cough or shortness of breath. Pain is worse with movement and taking a deep breath. It is better with taking shallow breaths and immobility.    Past Medical History  Diagnosis Date  . B12 deficiency     on monthly injections  . Headache(784.0)     spinal tap as OP   . Chest pain     2006 and 2011 ( neg nuclear stress test 2004) MI R/Oed  . Prostate nodule 2000    resolved w/o treatement; Dr Jeffie Pollock  . Elevated PSA     due to prostatis  . Hyperlipidemia     NMR panel 2005, LDL 125 (1309/534), HDL 38, TG 87. LDL Goal=<130. [place] study LDL Goald=<160.   . Elevated homocysteine ([place])   . Tubular adenoma of colon 06/2006    Dr Sharlett Iles  . Arthritis   . Anxiety   . Anemia   . Chest pain at rest 3/28-29/14  . Barrett's esophagus 03/2012  . Diverticulosis   . GERD (gastroesophageal reflux disease)    Past Surgical History  Procedure Laterality Date  . Rectal fistula repair  07/2004  . Rotator cuff repair Right 2007    Dr.Jeff Beane  . Prostate biopsy  2000    Dr.Wrenn  . Knee arthroscopy Left 09/22/2010  . Abdominal hernia repair  11/10/2010    mesh  . Knee arthroscopy Right 01/02/2011  . Replacement total knee Right 02/10/2011  . Colonoscopy w/ polypectomy  2001,2007,2013    F/U every 5 years; Dr Sharlett Iles  . Upper gi endoscopy  2013    S/P dilation   Family History  Problem Relation Age of Onset  . Ovarian  cancer Mother   . Brain cancer Maternal Aunt   . Diabetes Neg Hx   . Stroke Neg Hx   . Heart disease Neg Hx   . Crohn's disease Other     grandson  . Colon cancer Neg Hx   . Esophageal cancer Neg Hx   . Stomach cancer Neg Hx   . Pancreatic cancer Neg Hx   . Kidney disease Neg Hx   . Liver disease Neg Hx    Social History  Substance Use Topics  . Smoking status: Never Smoker   . Smokeless tobacco: Never Used  . Alcohol Use: No    Review of Systems  Constitutional: Positive for activity change. Negative for fever, appetite change and fatigue.  HENT: Negative.   Respiratory: Negative for cough, choking, shortness of breath and wheezing.   Cardiovascular: Negative.   Gastrointestinal: Negative.   Genitourinary: Negative.   Musculoskeletal: Negative for myalgias, neck pain and neck stiffness.  Skin: Negative.   Neurological: Negative.     Allergies  Zantac  Home Medications   Prior to Admission medications   Medication Sig Start Date End Date Taking? Authorizing Provider  acetaminophen (TYLENOL) 650 MG CR tablet Take 650 mg by mouth daily as needed for pain.  Yes Historical Provider, MD  aspirin 81 MG EC tablet Take 81 mg by mouth every morning.    Yes Historical Provider, MD  Cholecalciferol (VITAMIN D3) 1000 UNITS CAPS Take 1,000 Units by mouth every morning.    Yes Historical Provider, MD  cyanocobalamin (,VITAMIN B-12,) 1000 MCG/ML injection INJECT ONE ML (CC) INTRAMUSCULARLY EVERY 30 DAYS 11/23/14  Yes Hendricks Limes, MD  Multiple Vitamin (MULTIVITAMIN) capsule Take 1 capsule by mouth every morning.    Yes Historical Provider, MD  omeprazole (PRILOSEC) 40 MG capsule Take 1 capsule (40 mg total) by mouth 2 (two) times daily. 04/16/15  Yes Ladene Artist, MD  gabapentin (NEURONTIN) 100 MG capsule One pill every daily at bedtime as needed; dose may be increased by one pill each dose after 72 hours if only partially effective 04/06/15   Hendricks Limes, MD  GLUCOSAMINE PO  Take 1 tablet by mouth 2 (two) times daily.    Historical Provider, MD  mupirocin ointment (BACTROBAN) 2 % Apply 1 application topically 2 (two) times daily as needed (for umbilicus).    Historical Provider, MD  naproxen (NAPROSYN) 375 MG tablet Take 1 tablet (375 mg total) by mouth 2 (two) times daily. 09/25/15   Janne Napoleon, NP  traMADol (ULTRAM) 50 MG tablet Take 1 tablet (50 mg total) by mouth every 8 (eight) hours as needed. 04/06/15   Hendricks Limes, MD  TUBERCULIN SYR 1CC/27GX1/2" (B-D TB SYRINGE 1CC/27GX1/2") 27G X 1/2" 1 ML MISC Use as directed to give B12 injections Must transfer care to new provifer for refills 08/09/15   Golden Circle, FNP   Meds Ordered and Administered this Visit  Medications - No data to display  BP 151/83 mmHg  Pulse 98  Temp(Src) 97.6 F (36.4 C) (Oral)  Resp 16  SpO2 98% No data found.   Physical Exam  Constitutional: He is oriented to person, place, and time. He appears well-developed and well-nourished. No distress.  Eyes: Conjunctivae and EOM are normal.  Neck: Normal range of motion. Neck supple.  Cardiovascular: Normal rate, regular rhythm, normal heart sounds and intact distal pulses.   Pulmonary/Chest: Effort normal and breath sounds normal. No respiratory distress. He has no wheezes. He has no rales. He exhibits tenderness.  Tenderness to the left lower anterolateral ribs primarily along the anterior and mid axillary line. No observed deformity or swelling. No discoloration.  Abdominal: He exhibits no distension and no mass. There is no rebound and no guarding.  Mild tenderness to light palpation to the left upper abdominal wall.  Musculoskeletal: Normal range of motion. He exhibits no edema.  Lymphadenopathy:    He has no cervical adenopathy.  Neurological: He is alert and oriented to person, place, and time. No cranial nerve deficit. He exhibits normal muscle tone.  Skin: Skin is warm and dry. No erythema.  Psychiatric: He has a normal  mood and affect.  Nursing note and vitals reviewed.   ED Course  Procedures (including critical care time)  Labs Review Labs Reviewed - No data to display  Imaging Review Dg Ribs Unilateral W/chest Left  09/25/2015  CLINICAL DATA:  Lifting injury with left chest pain. EXAM: LEFT RIBS AND CHEST - 3+ VIEW COMPARISON:  Chest x-ray 10/11/2012 FINDINGS: The lungs are clear wiithout focal pneumonia, edema, pneumothorax or pleural effusion. The cardio pericardial silhouette is enlarged. Oblique views of the left ribs were obtained with radiopaque BB localizing the area of concern. No evidence for displaced left-sided rib fracture.  IMPRESSION: Negative. Electronically Signed   By: Misty Stanley M.D.   On: 09/25/2015 14:13     Visual Acuity Review  Right Eye Distance:   Left Eye Distance:   Bilateral Distance:    Right Eye Near:   Left Eye Near:    Bilateral Near:         MDM   1. Intercostal muscle strain, initial encounter   2. Rib pain on left side    Ice to the area of soreness for the first 1-2 days then apply heat. Naprosyn twice a day when necessary pain. Take deep breaths a few times every hour to maintain full lung expansion. For cough, shortness of breath or fever recheck promptly.    Janne Napoleon, NP 09/25/15 1420

## 2015-09-25 NOTE — ED Notes (Signed)
Pt reports he inj his left side rib cage yest night while picking up a [employer]... Reports he heard a "pop"... Pain increases w/activity, breathing, laughing, sneezing... Steady gait.... A&O x4... No acute distress... Wife at beside.

## 2015-12-01 ENCOUNTER — Ambulatory Visit (INDEPENDENT_AMBULATORY_CARE_PROVIDER_SITE_OTHER): Payer: 59

## 2015-12-01 DIAGNOSIS — Z862 Personal history of diseases of the blood and blood-forming organs and certain disorders involving the immune mechanism: Secondary | ICD-10-CM

## 2015-12-01 MED ORDER — CYANOCOBALAMIN 1000 MCG/ML IJ SOLN
1000.0000 ug | Freq: Once | INTRAMUSCULAR | Status: AC
  Administered 2015-12-01: 1000 ug via INTRAMUSCULAR

## 2016-01-30 ENCOUNTER — Other Ambulatory Visit: Payer: Self-pay | Admitting: Internal Medicine

## 2016-02-09 ENCOUNTER — Telehealth: Payer: Self-pay | Admitting: Internal Medicine

## 2016-02-09 ENCOUNTER — Emergency Department (HOSPITAL_COMMUNITY)
Admission: EM | Admit: 2016-02-09 | Discharge: 2016-02-10 | Disposition: A | Discharge status: Home / Self Care | Payer: 59 | Attending: Emergency Medicine | Admitting: Emergency Medicine

## 2016-02-09 ENCOUNTER — Encounter (HOSPITAL_COMMUNITY): Payer: Self-pay | Admitting: Emergency Medicine

## 2016-02-09 ENCOUNTER — Emergency Department (HOSPITAL_COMMUNITY): Payer: 59

## 2016-02-09 DIAGNOSIS — Z79899 Other long term (current) drug therapy: Secondary | ICD-10-CM | POA: Insufficient documentation

## 2016-02-09 DIAGNOSIS — R0789 Other chest pain: Secondary | ICD-10-CM | POA: Insufficient documentation

## 2016-02-09 DIAGNOSIS — E785 Hyperlipidemia, unspecified: Secondary | ICD-10-CM | POA: Diagnosis not present

## 2016-02-09 DIAGNOSIS — Z7982 Long term (current) use of aspirin: Secondary | ICD-10-CM | POA: Insufficient documentation

## 2016-02-09 LAB — BASIC METABOLIC PANEL
ANION GAP: 7 (ref 5–15)
BUN: 15 mg/dL (ref 6–20)
CHLORIDE: 105 mmol/L (ref 101–111)
CO2: 26 mmol/L (ref 22–32)
CREATININE: 0.89 mg/dL (ref 0.61–1.24)
Calcium: 9.5 mg/dL (ref 8.9–10.3)
GFR calc non Af Amer: >60 mL/min (ref >60)
Glucose, Bld: 102 mg/dL — ABNORMAL HIGH (ref 65–99)
POTASSIUM: 4.1 mmol/L (ref 3.5–5.1)
SODIUM: 138 mmol/L (ref 135–145)

## 2016-02-09 LAB — CBC
HEMATOCRIT: 44.2 % (ref 39.0–52.0)
HEMOGLOBIN: 14.8 g/dL (ref 13.0–17.0)
MCH: 30.6 pg (ref 26.0–34.0)
MCHC: 33.5 g/dL (ref 30.0–36.0)
MCV: 91.5 fL (ref 78.0–100.0)
PLATELETS: 243 10*3/uL (ref 150–400)
RBC: 4.83 MIL/uL (ref 4.22–5.81)
RDW: 13.2 % (ref 11.5–15.5)
WBC: 8.2 10*3/uL (ref 4.0–10.5)

## 2016-02-09 LAB — I-STAT TROPONIN, ED: Troponin i, poc: 0 ng/mL (ref 0.00–0.08)

## 2016-02-09 LAB — I-STAT CG4 LACTIC ACID, ED: LACTIC ACID, VENOUS: 0.76 mmol/L (ref 0.5–1.9)

## 2016-02-09 MED ORDER — DEXTROSE 5 % IV SOLN
1.0000 g | Freq: Once | INTRAVENOUS | Status: AC
  Administered 2016-02-09: 1 g via INTRAVENOUS
  Filled 2016-02-09: qty 10

## 2016-02-09 MED ORDER — DEXTROSE 5 % IV SOLN
500.0000 mg | Freq: Once | INTRAVENOUS | Status: AC
  Administered 2016-02-09: 500 mg via INTRAVENOUS
  Filled 2016-02-09: qty 500

## 2016-02-09 MED ORDER — IOPAMIDOL (ISOVUE-370) INJECTION 76%
100.0000 mL | Freq: Once | INTRAVENOUS | Status: AC | PRN
  Administered 2016-02-09: 100 mL via INTRAVENOUS

## 2016-02-09 NOTE — Discharge Instructions (Signed)
Use Tylenol every 4 hours if needed for pain.  Try using heat on the sore area of your left chest, if needed to help the discomfort.

## 2016-02-09 NOTE — ED Provider Notes (Signed)
[facility] DEPT Provider Note   CSN: [id number] Arrival date & time: 02/09/16  [id number]  First Provider Contact:  First MD Initiated Contact with Patient 02/09/16 2105        History   Chief Complaint Chief Complaint  Patient presents with  . Chest Pain    HPI Adrian Burke is a 75 y.o. male.  He presents by private vehicle for evaluation of left-sided chest pain which he describes as "heavy". Pain started earlier today without provocation. He denies nausea, vomiting, diarrhea, or dysuria. He has had some dyspnea on exertion today. There's been no cough, fever or chills. No prior similar problems. He has chronic ongoing upper chest discomfort, which is worse when supine, for several years. He has been told that this was from "GERD". No other recent illnesses. There are no other known modifying factors.   HPI  Past Medical History:  Diagnosis Date  . Anemia   . Anxiety   . Arthritis   . B12 deficiency    on monthly injections  . Barrett's esophagus 03/2012  . Chest pain    2006 and 2011 ( neg nuclear stress test 2004) MI R/Oed  . Chest pain at rest 3/28-29/14  . Diverticulosis   . Elevated homocysteine ([place])   . Elevated PSA    due to prostatis  . GERD (gastroesophageal reflux disease)   . Headache(784.0)    spinal tap as OP   . Hyperlipidemia    NMR panel 2005, LDL 125 (1309/534), HDL 38, TG 87. LDL Goal=<130. [place] study LDL Goald=<160.   . Prostate nodule 2000   resolved w/o treatement; Dr Jeffie Pollock  . Tubular adenoma of colon 06/2006   Dr Sharlett Iles    Patient Active Problem List   Diagnosis Date Noted  . Right lumbar radiculopathy 04/07/2015  . Obesity (BMI 30-39.9) 01/11/2014  . Barrett's esophagus 10/21/2012  . Chest pain 10/11/2012  . Hyperlipidemia 06/22/2010  . GERD 06/22/2010  . CERVICAL RADICULOPATHY, LEFT 05/31/2009  . ANXIETY STATE, UNSPECIFIED 01/10/2008  . ADENOMATOUS COLONIC POLYP 05/27/2007  . ANEMIA, PERNICIOUS, HX OF 05/27/2007     Past Surgical History:  Procedure Laterality Date  . ABDOMINAL HERNIA REPAIR  11/10/2010   mesh  . COLONOSCOPY W/ POLYPECTOMY  2001,2007,2013   F/U every 5 years; Dr Sharlett Iles  . KNEE ARTHROSCOPY Left 09/22/2010  . KNEE ARTHROSCOPY Right 01/02/2011  . PROSTATE BIOPSY  2000   Dr.Wrenn  . Rectal Fistula repair  07/2004  . REPLACEMENT TOTAL KNEE Right 02/10/2011  . ROTATOR CUFF REPAIR Right 2007   Dr.Jeff Beane  . UPPER GI ENDOSCOPY  2013   S/P dilation       Home Medications    Prior to Admission medications   Medication Sig Start Date End Date Taking? Authorizing Provider  acetaminophen (TYLENOL) 650 MG CR tablet Take 650 mg by mouth daily as needed for pain.    Yes Historical Provider, MD  aspirin 81 MG EC tablet Take 81 mg by mouth every morning.    Yes Historical Provider, MD  Cholecalciferol (VITAMIN D3) 1000 UNITS CAPS Take 1,000 Units by mouth every morning.    Yes Historical Provider, MD  cyanocobalamin (,VITAMIN B-12,) 1000 MCG/ML injection Inject 1 mL (1,000 mcg total) into the muscle every 30 (thirty) days. Keep sept appt w/new PCP for future refills 01/31/16  Yes Biagio Borg, MD  Multiple Vitamin (MULTIVITAMIN) capsule Take 1 capsule by mouth every morning.    Yes Historical Provider, MD  omeprazole ([place])  40 MG capsule Take 1 capsule (40 mg total) by mouth 2 (two) times daily. 04/16/15  Yes Ladene Artist, MD  OVER THE COUNTER MEDICATION Take 1 tablet by mouth daily.   Yes Historical Provider, MD  gabapentin (NEURONTIN) 100 MG capsule One pill every daily at bedtime as needed; dose may be increased by one pill each dose after 72 hours if only partially effective Patient not taking: Reported on 02/09/2016 04/06/15   Hendricks Limes, MD  naproxen (NAPROSYN) 375 MG tablet Take 1 tablet (375 mg total) by mouth 2 (two) times daily. Patient not taking: Reported on 02/09/2016 09/25/15   Janne Napoleon, NP  traMADol (ULTRAM) 50 MG tablet Take 1 tablet (50 mg total) by mouth every  8 (eight) hours as needed. Patient not taking: Reported on 02/09/2016 04/06/15   Hendricks Limes, MD  TUBERCULIN SYR 1CC/27GX1/2" (B-D TB SYRINGE 1CC/27GX1/2") 27G X 1/2" 1 ML MISC Use as directed to give B12 injections Must transfer care to new provifer for refills 08/09/15   Golden Circle, FNP    Family History Family History  Problem Relation Age of Onset  . Ovarian cancer Mother   . Brain cancer Maternal Aunt   . Crohn's disease Other     grandson  . Diabetes Neg Hx   . Stroke Neg Hx   . Heart disease Neg Hx   . Colon cancer Neg Hx   . Esophageal cancer Neg Hx   . Stomach cancer Neg Hx   . Pancreatic cancer Neg Hx   . Kidney disease Neg Hx   . Liver disease Neg Hx     Social History Social History  Substance Use Topics  . Smoking status: Never Smoker  . Smokeless tobacco: Never Used  . Alcohol use No     Allergies   Review of patient's allergies indicates no known allergies.   Review of Systems Review of Systems  All other systems reviewed and are negative.    Physical Exam Updated Vital Signs BP 123/74 (BP Location: Right Arm)   Pulse 63   Temp 98.5 F (36.9 C) (Oral)   Resp 12   SpO2 100%   Physical Exam  Constitutional: He appears well-developed.  Elderly, frail  HENT:  Head: Normocephalic and atraumatic.  Eyes: Conjunctivae are normal.  Neck: Neck supple.  Cardiovascular: Normal rate and regular rhythm.   No murmur heard. Pulmonary/Chest: Effort normal and breath sounds normal. No respiratory distress. He has no wheezes. He has no rales.  Abdominal: Soft. There is tenderness (Mild left and right lower quadrant tenderness.).  Musculoskeletal: He exhibits no edema, tenderness or deformity.  Neurological: He is alert.  Skin: Skin is warm and dry.  Psychiatric: He has a normal mood and affect.  Nursing note and vitals reviewed.    ED Treatments / Results  Labs (all labs ordered are listed, but only abnormal results are displayed) Labs  Reviewed  BASIC METABOLIC PANEL - Abnormal; Notable for the following:       Result Value   Glucose, Bld 102 (*)    All other components within normal limits  CBC  I-STAT TROPOININ, ED  I-STAT CG4 LACTIC ACID, ED    EKG  EKG Interpretation  Date/Time:  Wednesday February 09 2016 18:44:54 EDT Ventricular Rate:  74 PR Interval:    QRS Duration: 90 QT Interval:  387 QTC Calculation: 430 R Axis:   7 Text Interpretation:  Sinus rhythm Low voltage, precordial leads Consider anterior infarct Baseline  wander in lead(s) V3 Since last tracing decreased anterior R wave Confirmed by [facility]  MD, Taneesha Edgin [phone]) on 02/09/2016 9:07:26 PM       Radiology Dg Chest 2 View  Result Date: 02/09/2016 CLINICAL DATA:  Generalized chest pain and discomfort. Shortness of breath for 2 weeks. EXAM: CHEST  2 VIEW COMPARISON:  None. FINDINGS: The heart size is exaggerate by low lung volumes. Left lower lobe pneumonia is present. A small effusion is present as well. The upper lung fields are clear. Degenerative changes are noted in the thoracic spine. IMPRESSION: New left lower lobe pneumonia. Electronically Signed   By: San Morelle M.D.   On: 02/09/2016 19:32  Ct Angio Chest Pe W/cm &/or Wo Cm  Result Date: 02/09/2016 CLINICAL DATA:  75 year old male with left-sided chest pain EXAM: CT ANGIOGRAPHY CHEST WITH CONTRAST TECHNIQUE: Multidetector CT imaging of the chest was performed using the standard protocol during bolus administration of intravenous contrast. Multiplanar CT image reconstructions and MIPs were obtained to evaluate the vascular anatomy. CONTRAST:  100 cc Isovue 370 COMPARISON:  Chest radiograph dated 02/09/2016 FINDINGS: Minimal bibasilar dependent atelectatic changes of the lungs. Small bilateral scattered pulmonary nodules measuring up to 4-10mm along the left major fissure (series 4, image 51). These nodules are nonspecific but likely post infectious/inflammatory in etiology. There is no focal  consolidation. No pleural effusion or pneumothorax. The central airways are patent. There is mild atherosclerotic calcification of the aortic arch. There is no dissection or aneurysmal dilatation. The origins of the great vessels of the aortic arch appear patent. No CT evidence of pulmonary embolism. There is no cardiomegaly or pericardial effusion. There is no hilar or mediastinal adenopathy. Esophagus and the thyroid gland appear unremarkable. There is no axillary or supraclavicular adenopathy. The chest wall soft tissues appear unremarkable. There is degenerative changes of the spine. No acute fracture. The visualized upper abdomen appears unremarkable. Review of the MIP images confirms the above findings. IMPRESSION: No CT evidence of pulmonary embolism. Multiple bilateral small pulmonary nodules. No follow-up needed if patient is low-risk (and has no known or suspected primary neoplasm). Non-contrast chest CT can be considered in 12 months if patient is high-risk. This recommendation follows the consensus statement: Guidelines for Management of Incidental Pulmonary Nodules Detected on CT Images:From the Fleischner Society 2017; published online before print (10.1148/radiol.SG:5268862). Electronically Signed   By: Anner Crete M.D.   On: 02/09/2016 22:42   Procedures Procedures (including critical care time)  Medications Ordered in ED Medications  azithromycin (ZITHROMAX) 500 mg in dextrose 5 % 250 mL IVPB (500 mg Intravenous New Bag/Given 02/09/16 2255)  cefTRIAXone (ROCEPHIN) 1 g in dextrose 5 % 50 mL IVPB (0 g Intravenous Stopped 02/09/16 2251)  iopamidol (ISOVUE-370) 76 % injection 100 mL (100 mLs Intravenous Contrast Given 02/09/16 2218)     Initial Impression / Assessment and Plan / ED Course  I have reviewed the triage vital signs and the nursing notes.  Pertinent labs & imaging results that were available during my care of the patient were reviewed by me and considered in my medical  decision making (see chart for details).  Clinical Course    Medications  azithromycin (ZITHROMAX) 500 mg in dextrose 5 % 250 mL IVPB (500 mg Intravenous New Bag/Given 02/09/16 2255)  cefTRIAXone (ROCEPHIN) 1 g in dextrose 5 % 50 mL IVPB (0 g Intravenous Stopped 02/09/16 2251)  iopamidol (ISOVUE-370) 76 % injection 100 mL (100 mLs Intravenous Contrast Given 02/09/16 2218)    Patient  Vitals for the past 24 hrs:  BP Temp Temp src Pulse Resp SpO2  02/09/16 2317 123/74 - - 63 12 100 %  02/09/16 2058 146/98 - - 62 16 95 %  02/09/16 1850 136/96 98.5 F (36.9 C) Oral 69 14 95 %    11:52 PM Reevaluation with update and discussion. After initial assessment and treatment, an updated evaluation reveals Patient remains comfortable and has no further complaints. Findings discussed with the patient, all questions were answered. Barnes Florek L    Final Clinical Impressions(s) / ED Diagnoses   Final diagnoses:  Chest wall pain    Nursing Notes Reviewed/ Care Coordinated Applicable Imaging Reviewed Interpretation of Laboratory Data incorporated into ED treatment  The patient appears reasonably screened and/or stabilized for discharge and I doubt any other medical condition or other [facility] requiring further screening, evaluation, or treatment in the ED at this time prior to discharge.  Plan: Home Medications- continue, use Tylenol for pain; Home Treatments- rest, heat; return here if the recommended treatment, does not improve the symptoms; Recommended follow up- PCP 1 week for check up and to discuss the CT results.   New Prescriptions New Prescriptions   No medications on file     Daleen Bo, MD 02/09/16 [phone]

## 2016-02-09 NOTE — Telephone Encounter (Signed)
[facility] - Client [facility] Patient Name: Adrian Burke DOB: 12/04/1940 Initial Comment Caller states two weeks ago he was mowing, and had bad chest pain. He is still having chest pain. He is also having pain in his stomach and crotch area. Nurse Assessment Nurse: Markus Daft, RN, Sherre Poot Date/Time (Eastern Time): 02/09/2016 2:25:10 PM Confirm and document reason for call. If symptomatic, describe symptoms. You must click the next button to save text entered. ---Caller states two weeks ago he was mowing, and had severe chest pain. "Had trouble laying down". Since, it has been mild to moderate CP. Pressure type chest pain right now across his chest. Soreness to touch. c/o some difficulty breathing. He is also having pain in his lower right abdominal (h/o hernia surgery several years ago) and right groin area with some numbness at times. Has the patient traveled out of the country within the last 30 days? ---Not Applicable Does the patient have any new or worsening symptoms? ---Yes Will a triage be completed? ---Yes Related visit to physician within the last 2 weeks? ---No Does the PT have any chronic conditions? (i.e. diabetes, asthma, etc.) ---Yes List chronic conditions. ---GERD, pre cancerous esophagus, borderline DM Is this a behavioral health or substance abuse call? ---No Guidelines Guideline Title Affirmed Question Affirmed Notes Chest Pain [1] Chest pain lasts > 5 minutes AND [2] described as crushing, pressure-like, or heavy Final Disposition User Call EMS 911 Now Markus Daft, RN, [place] Disagree/Comply: Disagree Disagree/Comply Reason: Disagree with instructions Caller states that he will go to [facility]  instead of calling 911.  He understands the advice.

## 2016-02-09 NOTE — ED Triage Notes (Addendum)
Pt c/o constant sharp left chest pain radiating to the left arm and back x2 weeks. Pt reports SOB while walking and nausea. Pt also reports RLQ pain x2 days. Denies vomiting/diarrhea.

## 2016-02-17 ENCOUNTER — Telehealth: Payer: Self-pay

## 2016-02-17 ENCOUNTER — Ambulatory Visit (INDEPENDENT_AMBULATORY_CARE_PROVIDER_SITE_OTHER): Payer: 59 | Admitting: Internal Medicine

## 2016-02-17 ENCOUNTER — Encounter: Payer: Self-pay | Admitting: Internal Medicine

## 2016-02-17 VITALS — BP 120/78 | HR 78 | Temp 98.1°F | Wt 212.0 lb

## 2016-02-17 DIAGNOSIS — R0600 Dyspnea, unspecified: Secondary | ICD-10-CM | POA: Insufficient documentation

## 2016-02-17 DIAGNOSIS — R079 Chest pain, unspecified: Secondary | ICD-10-CM

## 2016-02-17 DIAGNOSIS — R918 Other nonspecific abnormal finding of lung field: Secondary | ICD-10-CM | POA: Diagnosis not present

## 2016-02-17 DIAGNOSIS — Z0001 Encounter for general adult medical examination with abnormal findings: Secondary | ICD-10-CM | POA: Insufficient documentation

## 2016-02-17 MED ORDER — ALBUTEROL SULFATE HFA 108 (90 BASE) MCG/ACT IN AERS: 2.0000 | INHALATION_SPRAY | Freq: Four times a day (QID) | RESPIRATORY_TRACT | 11 refills | Status: DC | PRN

## 2016-02-17 NOTE — Patient Instructions (Signed)
Please take all new medication as prescribed - the inhaler to try for shortness of breath  Please continue all other medications as before, and refills have been done if requested.  Please have the pharmacy call with any other refills you may need.  Please keep your appointments with your specialists as you may have planned  You will be contacted regarding the referral for: Lung testing (pulmonary function tests), and the Stress Test

## 2016-02-17 NOTE — Progress Notes (Signed)
Pre visit review using our clinic review tool, if applicable. No additional management support is needed unless otherwise documented below in the visit note.  Patient refused penumovax vaccine today at office visit

## 2016-02-17 NOTE — Progress Notes (Signed)
Subjective:    Patient ID: Adrian Burke, male    DOB: [birth year], 75 y.o.   MRN: [id number]  HPI  Here as new pt to me after seeing Dr Linna Darner for many yrs; was recenty seen in ER with chest discomfort, CT c/w mult very small pulm nodules, nonsmoker, no hx of malignancy and  Pt denies fever, wt loss, night sweats, loss of appetite, or other constitutional symptoms  Has had intermittent refluxk but this discomfort is different.  C/o intermittent dyspnea and chest heaviness mild, without radiation, n/v, diaphoresis, palp or dizziness.   Pt denies polydipsia, polyuria Past Medical History:  Diagnosis Date  . Anemia   . Anxiety   . Arthritis   . B12 deficiency    on monthly injections  . Barrett's esophagus 03/2012  . Chest pain    2006 and 2011 ( neg nuclear stress test 2004) MI R/Oed  . Chest pain at rest 3/28-29/14  . Diverticulosis   . Elevated homocysteine ([place])   . Elevated PSA    due to prostatis  . GERD (gastroesophageal reflux disease)   . Headache(784.0)    spinal tap as OP   . Hyperlipidemia    NMR panel 2005, LDL 125 (1309/534), HDL 38, TG 87. LDL Goal=<130. [place] study LDL Goald=<160.   . Prostate nodule 2000   resolved w/o treatement; Dr Jeffie Pollock  . Tubular adenoma of colon 06/2006   Dr Sharlett Iles   Past Surgical History:  Procedure Laterality Date  . ABDOMINAL HERNIA REPAIR  11/10/2010   mesh  . COLONOSCOPY W/ POLYPECTOMY  2001,2007,2013   F/U every 5 years; Dr Sharlett Iles  . KNEE ARTHROSCOPY Left 09/22/2010  . KNEE ARTHROSCOPY Right 01/02/2011  . PROSTATE BIOPSY  2000   Dr.Wrenn  . Rectal Fistula repair  07/2004  . REPLACEMENT TOTAL KNEE Right 02/10/2011  . ROTATOR CUFF REPAIR Right 2007   Dr.Jeff Beane  . UPPER GI ENDOSCOPY  2013   S/P dilation    reports that he has never smoked. He has never used smokeless tobacco. He reports that he does not drink alcohol or use drugs. family history includes Brain cancer in his maternal aunt; Crohn's disease in his other;  Ovarian cancer in his mother. No Known Allergies Current Outpatient Prescriptions on File Prior to Visit  Medication Sig Dispense Refill  . acetaminophen (TYLENOL) 650 MG CR tablet Take 650 mg by mouth daily as needed for pain.     [name] aspirin 81 MG EC tablet Take 81 mg by mouth every morning.     . Cholecalciferol (VITAMIN D3) 1000 UNITS CAPS Take 1,000 Units by mouth every morning.     . cyanocobalamin (,VITAMIN B-12,) 1000 MCG/ML injection Inject 1 mL (1,000 mcg total) into the muscle every 30 (thirty) days. Keep sept appt w/new PCP for future refills 30 mL 0  . Multiple Vitamin (MULTIVITAMIN) capsule Take 1 capsule by mouth every morning.     [name] omeprazole (PRILOSEC) 40 MG capsule Take 1 capsule (40 mg total) by mouth 2 (two) times daily. 180 capsule 3  . OVER THE COUNTER MEDICATION Take 1 tablet by mouth daily.    . TUBERCULIN SYR 1CC/27GX1/2" (B-D TB SYRINGE 1CC/27GX1/2") 27G X 1/2" 1 ML MISC Use as directed to give B12 injections Must transfer care to new provifer for refills 50 each 0  . gabapentin (NEURONTIN) 100 MG capsule One pill every daily at bedtime as needed; dose may be increased by one pill each dose after 72 hours  if only partially effective (Patient not taking: Reported on 02/09/2016) 30 capsule 2  . naproxen (NAPROSYN) 375 MG tablet Take 1 tablet (375 mg total) by mouth 2 (two) times daily. (Patient not taking: Reported on 02/09/2016) 20 tablet 0  . traMADol (ULTRAM) 50 MG tablet Take 1 tablet (50 mg total) by mouth every 8 (eight) hours as needed. (Patient not taking: Reported on 02/09/2016) 30 tablet 0   No current facility-administered medications on file prior to visit.     Review of Systems  Constitutional: Negative for unusual diaphoresis or night sweats HENT: Negative for ear swelling or discharge Eyes: Negative for worsening visual haziness  Respiratory: Negative for choking and stridor.   Gastrointestinal: Negative for distension or worsening  eructation Genitourinary: Negative for retention or change in urine volume.  Musculoskeletal: Negative for other MSK pain or swelling Skin: Negative for color change and worsening wound Neurological: Negative for tremors and numbness other than noted  Psychiatric/Behavioral: Negative for decreased concentration or agitation other than above       Objective:   Physical Exam BP 120/78   Pulse 78   Temp 98.1 F (36.7 C)   Wt 212 lb (96.2 kg)   SpO2 96%   BMI 31.31 kg/m  VS noted,  Constitutional: Pt appears in no apparent distress HENT: Head: NCAT.  Right Ear: External ear normal.  Left Ear: External ear normal.  Eyes: . Pupils are equal, round, and reactive to light. Conjunctivae and EOM are normal Neck: Normal range of motion. Neck supple.  Cardiovascular: Normal rate and regular rhythm.   Pulmonary/Chest: Effort normal and breath sounds decreased without rales or wheezing.  Abd:  Soft, NT, ND, + BS Neurological: Pt is alert. Not confused , motor grossly intact Skin: Skin is warm. No rash, no LE edema Psychiatric: Pt behavior is normal. No agitation.     Assessment & Plan:

## 2016-02-17 NOTE — Telephone Encounter (Signed)
Patient refused pneumovax at office visit today

## 2016-02-19 NOTE — Assessment & Plan Note (Signed)
Atypical, etiology unclear, declines f/u ecg today, for stress test,  to f/u any worsening symptoms or concerns

## 2016-02-19 NOTE — Assessment & Plan Note (Signed)
?   Deconditioning vs other - for PFT's, trial inhaler use,  to f/u any worsening symptoms or concerns

## 2016-02-19 NOTE — Assessment & Plan Note (Signed)
Very small, non smoker, no hx of malignancy or recent wt loss, for f/u CT at 1 yr

## 2016-03-07 ENCOUNTER — Telehealth (HOSPITAL_COMMUNITY): Payer: Self-pay | Admitting: *Deleted

## 2016-03-07 NOTE — Telephone Encounter (Signed)
Patient given detailed instructions per Myocardial Perfusion Study Information Sheet for the test on 03/10/16 at 0815. Patient notified to arrive 15 minutes early and that it is imperative to arrive on time for appointment to keep from having the test rescheduled.  If you need to cancel or reschedule your appointment, please call the office within 24 hours of your appointment. Failure to do so may result in a cancellation of your appointment, and a $50 no show fee. Patient verbalized understanding.Sigmund Morera, Ranae Palms

## 2016-03-10 ENCOUNTER — Ambulatory Visit (HOSPITAL_COMMUNITY): Payer: 59 | Attending: Cardiology

## 2016-03-10 DIAGNOSIS — R0609 Other forms of dyspnea: Secondary | ICD-10-CM | POA: Diagnosis not present

## 2016-03-10 DIAGNOSIS — R9439 Abnormal result of other cardiovascular function study: Secondary | ICD-10-CM | POA: Diagnosis not present

## 2016-03-10 DIAGNOSIS — R0600 Dyspnea, unspecified: Secondary | ICD-10-CM | POA: Diagnosis not present

## 2016-03-10 DIAGNOSIS — R918 Other nonspecific abnormal finding of lung field: Secondary | ICD-10-CM | POA: Insufficient documentation

## 2016-03-10 DIAGNOSIS — R079 Chest pain, unspecified: Secondary | ICD-10-CM | POA: Insufficient documentation

## 2016-03-10 MED ORDER — TECHNETIUM TC 99M TETROFOSMIN IV KIT
32.5000 | PACK | Freq: Once | INTRAVENOUS | Status: AC | PRN
  Administered 2016-03-10: 33 via INTRAVENOUS
  Filled 2016-03-10: qty 33

## 2016-03-10 MED ORDER — TECHNETIUM TC 99M TETROFOSMIN IV KIT
10.5000 | PACK | Freq: Once | INTRAVENOUS | Status: AC | PRN
  Administered 2016-03-10: 11 via INTRAVENOUS
  Filled 2016-03-10: qty 11

## 2016-03-12 LAB — MYOCARDIAL PERFUSION IMAGING
CHL CUP MPHR: 146 {beats}/min
CHL CUP NUCLEAR SDS: 2
CHL CUP NUCLEAR SRS: 5
CSEPPHR: 134 {beats}/min
Estimated workload: 7 METS
Exercise duration (min): 6 min
Exercise duration (sec): 10 s
LHR: 0.28
LV dias vol: 108 mL (ref 62–150)
LVSYSVOL: 37 mL
NUC STRESS TID: 0.78
Percent HR: 91 %
Rest HR: 55 {beats}/min
SSS: 7

## 2016-03-27 ENCOUNTER — Other Ambulatory Visit: Payer: Self-pay | Admitting: Gastroenterology

## 2016-03-27 DIAGNOSIS — K2270 Barrett's esophagus without dysplasia: Secondary | ICD-10-CM

## 2016-04-10 ENCOUNTER — Other Ambulatory Visit (INDEPENDENT_AMBULATORY_CARE_PROVIDER_SITE_OTHER): Payer: 59

## 2016-04-10 ENCOUNTER — Telehealth: Payer: Self-pay | Admitting: Emergency Medicine

## 2016-04-10 ENCOUNTER — Telehealth: Payer: Self-pay

## 2016-04-10 DIAGNOSIS — E785 Hyperlipidemia, unspecified: Secondary | ICD-10-CM

## 2016-04-10 DIAGNOSIS — D649 Anemia, unspecified: Secondary | ICD-10-CM | POA: Diagnosis not present

## 2016-04-10 DIAGNOSIS — Z125 Encounter for screening for malignant neoplasm of prostate: Secondary | ICD-10-CM | POA: Diagnosis not present

## 2016-04-10 DIAGNOSIS — E669 Obesity, unspecified: Secondary | ICD-10-CM

## 2016-04-10 LAB — HEPATIC FUNCTION PANEL
ALBUMIN: 4 g/dL (ref 3.5–5.2)
ALK PHOS: 68 U/L (ref 39–117)
ALT: 23 U/L (ref 0–53)
AST: 23 U/L (ref 0–37)
Bilirubin, Direct: 0.1 mg/dL (ref 0.0–0.3)
Total Bilirubin: 0.7 mg/dL (ref 0.2–1.2)
Total Protein: 6.7 g/dL (ref 6.0–8.3)

## 2016-04-10 LAB — LIPID PANEL
CHOL/HDL RATIO: 4
Cholesterol: 206 mg/dL — ABNORMAL HIGH (ref 0–200)
HDL: 50.3 mg/dL (ref >39.00)
LDL Cholesterol: 138 mg/dL — ABNORMAL HIGH (ref 0–99)
NONHDL: 155.36
Triglycerides: 87 mg/dL (ref 0.0–149.0)
VLDL: 17.4 mg/dL (ref 0.0–40.0)

## 2016-04-10 LAB — MICROALBUMIN / CREATININE URINE RATIO
Creatinine,U: 158.1 mg/dL
Microalb Creat Ratio: 0.6 mg/g (ref 0.0–30.0)
Microalb, Ur: 0.9 mg/dL (ref 0.0–1.9)

## 2016-04-10 LAB — CBC
HCT: 40.3 % (ref 39.0–52.0)
Hemoglobin: 13.7 g/dL (ref 13.0–17.0)
MCHC: 34.1 g/dL (ref 30.0–36.0)
MCV: 88.7 fl (ref 78.0–100.0)
PLATELETS: 261 10*3/uL (ref 150.0–400.0)
RBC: 4.55 Mil/uL (ref 4.22–5.81)
RDW: 13.8 % (ref 11.5–15.5)
WBC: 5.8 10*3/uL (ref 4.0–10.5)

## 2016-04-10 LAB — BASIC METABOLIC PANEL
BUN: 14 mg/dL (ref 6–23)
CHLORIDE: 106 meq/L (ref 96–112)
CO2: 27 meq/L (ref 19–32)
Calcium: 9 mg/dL (ref 8.4–10.5)
Creatinine, Ser: 0.9 mg/dL (ref 0.40–1.50)
GFR: 87.49 mL/min (ref >60.00)
GLUCOSE: 89 mg/dL (ref 70–99)
POTASSIUM: 4.1 meq/L (ref 3.5–5.1)
SODIUM: 141 meq/L (ref 135–145)

## 2016-04-10 LAB — TSH: TSH: 1.42 u[IU]/mL (ref 0.35–4.50)

## 2016-04-10 LAB — PSA: PSA: 0.53 ng/mL (ref 0.10–4.00)

## 2016-04-10 LAB — HEMOGLOBIN A1C: HEMOGLOBIN A1C: 5.5 % (ref 4.6–6.5)

## 2016-04-10 NOTE — Telephone Encounter (Signed)
Placed orders for patient. Will contact patient to let him know that he can have labs drawn ahead of time.

## 2016-04-10 NOTE — Telephone Encounter (Signed)
Called patient, spoke to wife they are aware that they can come in early for labs.

## 2016-04-10 NOTE — Telephone Encounter (Signed)
Pt came in this morning to get his blood work done before his CPE on Wed. Those orders werent put in. Can he have them put in so he can come ahead of time to get those done. Please advise thanks.

## 2016-04-10 NOTE — Telephone Encounter (Signed)
Patient requested labs to be placed ahead of time. Labs ordered. Will advise patient to check with insurance before having labs drawn to make sure it will be covered.

## 2016-04-12 ENCOUNTER — Ambulatory Visit (INDEPENDENT_AMBULATORY_CARE_PROVIDER_SITE_OTHER): Payer: 59 | Admitting: Internal Medicine

## 2016-04-12 ENCOUNTER — Encounter: Payer: Self-pay | Admitting: Internal Medicine

## 2016-04-12 VITALS — BP 136/80 | HR 73 | Temp 98.4°F | Resp 20 | Wt 216.5 lb

## 2016-04-12 DIAGNOSIS — R7303 Prediabetes: Secondary | ICD-10-CM

## 2016-04-12 DIAGNOSIS — E785 Hyperlipidemia, unspecified: Secondary | ICD-10-CM

## 2016-04-12 DIAGNOSIS — R6889 Other general symptoms and signs: Secondary | ICD-10-CM

## 2016-04-12 DIAGNOSIS — J309 Allergic rhinitis, unspecified: Secondary | ICD-10-CM | POA: Diagnosis not present

## 2016-04-12 DIAGNOSIS — Z0001 Encounter for general adult medical examination with abnormal findings: Secondary | ICD-10-CM | POA: Diagnosis not present

## 2016-04-12 DIAGNOSIS — L989 Disorder of the skin and subcutaneous tissue, unspecified: Secondary | ICD-10-CM

## 2016-04-12 MED ORDER — ROSUVASTATIN CALCIUM 20 MG PO TABS: 20.0000 mg | ORAL_TABLET | Freq: Every day | ORAL | 3 refills | Status: DC

## 2016-04-12 NOTE — Patient Instructions (Addendum)
You had the Tdap (tetanus) shot today  Please take all new medication as prescribed - the crestor 20 mg  OK to take the OTC zyrtec and Nasacort for the allergies, as this should help the cough  Please continue all other medications as before, and refills have been done if requested.  Please have the pharmacy call with any other refills you may need.  Please continue your efforts at being more active, low cholesterol diet, and weight control.  You are otherwise up to date with prevention measures today.  Please keep your appointments with your specialists as you may have planned  You will be contacted regarding the referral for: [place]  Please return in 1 year for your yearly visit, or sooner if needed, with Lab testing done 3-5 days before

## 2016-04-12 NOTE — Progress Notes (Signed)
Subjective:    Patient ID: Adrian Burke, male    DOB: Jun 02, 1941, 75 y.o.   MRN: [id number]  HPI  Here for wellness and f/u;  Overall doing ok;  Pt denies Chest pain, worsening SOB, DOE, wheezing, orthopnea, PND, worsening LE edema, palpitations, dizziness or syncope.  Pt denies neurological change such as new headache, facial or extremity weakness.  Pt denies polydipsia, polyuria, or low sugar symptoms  Pt denies worsening depressive symptoms, suicidal ideation or panic. No fever, night sweats, wt loss, loss of appetite, or other constitutional symptoms.  Pt states good ability with ADL's, has low fall risk, home safety reviewed and adequate, no other significant changes in hearing or vision, and only occasionally active with exercise.  Declines flu shot, but ok for Tdap.    . Pt states overall good compliance with treatment and medications, good tolerability, and has been trying to follow appropriate diet, low cholesterol but not working out. Does have several wks ongoing nasal allergy symptoms with clearish congestion, itch and sneezing, without fever, pain, ST, cough, swelling or wheezing.  Also has a newly seen skin lesion at top of right shoulder, not sure if getting worse recently, but wife asked him to make sure to mention Past Medical History:  Diagnosis Date  . Anemia   . Anxiety   . Arthritis   . B12 deficiency    on monthly injections  . Barrett's esophagus 03/2012  . Chest pain    2006 and 2011 ( neg nuclear stress test 2004) MI R/Oed  . Chest pain at rest 3/28-29/14  . Diverticulosis   . Elevated homocysteine ([place])   . Elevated PSA    due to prostatis  . GERD (gastroesophageal reflux disease)   . Headache(784.0)    spinal tap as OP   . Hyperlipidemia    NMR panel 2005, LDL 125 (1309/534), HDL 38, TG 87. LDL Goal=<130. [place] study LDL Goald=<160.   . Prostate nodule 2000   resolved w/o treatement; Dr Jeffie Pollock  . Tubular adenoma of colon 06/2006   Dr Sharlett Iles    Past Surgical History:  Procedure Laterality Date  . ABDOMINAL HERNIA REPAIR  11/10/2010   mesh  . COLONOSCOPY W/ POLYPECTOMY  2001,2007,2013   F/U every 5 years; Dr Sharlett Iles  . KNEE ARTHROSCOPY Left 09/22/2010  . KNEE ARTHROSCOPY Right 01/02/2011  . PROSTATE BIOPSY  2000   Dr.Wrenn  . Rectal Fistula repair  07/2004  . REPLACEMENT TOTAL KNEE Right 02/10/2011  . ROTATOR CUFF REPAIR Right 2007   Dr.Jeff Beane  . UPPER GI ENDOSCOPY  2013   S/P dilation    reports that he has never smoked. He has never used smokeless tobacco. He reports that he does not drink alcohol or use drugs. family history includes Brain cancer in his maternal aunt; Crohn's disease in his other; Ovarian cancer in his mother. No Known Allergies Current Outpatient Prescriptions on File Prior to Visit  Medication Sig Dispense Refill  . acetaminophen (TYLENOL) 650 MG CR tablet Take 650 mg by mouth daily as needed for pain.     [name] albuterol (PROVENTIL HFA;VENTOLIN HFA) 108 (90 Base) MCG/ACT inhaler Inhale 2 puffs into the lungs every 6 (six) hours as needed for wheezing or shortness of breath. 1 Inhaler 11  . aspirin 81 MG EC tablet Take 81 mg by mouth every morning.     . Cholecalciferol (VITAMIN D3) 1000 UNITS CAPS Take 1,000 Units by mouth every morning.     . cyanocobalamin (,  VITAMIN B-12,) 1000 MCG/ML injection Inject 1 mL (1,000 mcg total) into the muscle every 30 (thirty) days. Keep sept appt w/new PCP for future refills 30 mL 0  . gabapentin (NEURONTIN) 100 MG capsule One pill every daily at bedtime as needed; dose may be increased by one pill each dose after 72 hours if only partially effective 30 capsule 2  . Multiple Vitamin (MULTIVITAMIN) capsule Take 1 capsule by mouth every morning.     . naproxen (NAPROSYN) 375 MG tablet Take 1 tablet (375 mg total) by mouth 2 (two) times daily. 20 tablet 0  . omeprazole (PRILOSEC) 40 MG capsule Take 1 capsule by mouth two times daily 180 capsule 0  . OVER THE COUNTER  MEDICATION Take 1 tablet by mouth daily.    . traMADol (ULTRAM) 50 MG tablet Take 1 tablet (50 mg total) by mouth every 8 (eight) hours as needed. 30 tablet 0  . TUBERCULIN SYR 1CC/27GX1/2" (B-D TB SYRINGE 1CC/27GX1/2") 27G X 1/2" 1 ML MISC Use as directed to give B12 injections Must transfer care to new provifer for refills 50 each 0   No current facility-administered medications on file prior to visit.    Review of Systems Constitutional: Negative for increased diaphoresis, or other activity, appetite or siginficant weight change other than noted HENT: Negative for worsening hearing loss, ear pain, facial swelling, mouth sores and neck stiffness.   Eyes: Negative for other worsening pain, redness or visual disturbance.  Respiratory: Negative for choking or stridor Cardiovascular: Negative for other chest pain and palpitations.  Gastrointestinal: Negative for worsening diarrhea, blood in stool, or abdominal distention Genitourinary: Negative for hematuria, flank pain or change in urine volume.  Musculoskeletal: Negative for myalgias or other joint complaints.  Skin: Negative for other color change and wound or drainage.  Neurological: Negative for syncope and numbness. other than noted Hematological: Negative for adenopathy. or other swelling Psychiatric/Behavioral: Negative for hallucinations, SI, self-injury, decreased concentration or other worsening agitation.      Objective:   Physical Exam BP 136/80   Pulse 73   Temp 98.4 F (36.9 C) (Oral)   Resp 20   Wt 216 lb 8 oz (98.2 kg)   SpO2 96%   BMI 31.97 kg/m  VS noted,  Constitutional: Pt is oriented to person, place, and time. Appears well-developed and well-nourished, in no significant distress Head: Normocephalic and atraumatic  Eyes: Conjunctivae and EOM are normal. Pupils are equal, round, and reactive to light Right Ear: External ear normal.  Left Ear: External ear normal Nose: Nose normal.  Mouth/Throat: Oropharynx  is clear and moist  Neck: Normal range of motion. Neck supple. No JVD present. No tracheal deviation present or significant neck LA or mass Cardiovascular: Normal rate, regular rhythm, normal heart sounds and intact distal pulses.   Pulmonary/Chest: Effort normal and breath sounds without rales or wheezing  Abdominal: Soft. Bowel sounds are normal. NT. No HSM  Musculoskeletal: Normal range of motion. Exhibits no edema Lymphadenopathy: Has no cervical adenopathy.  Neurological: Pt is alert and oriented to person, place, and time. Pt has normal reflexes. No cranial nerve deficit. Motor grossly intact Skin: Skin is warm and dry. No rash noted or new ulcers, but skin lesion to top fo right shoulder with 10 mm area dark/black slightly raised, edges slightly irreg, no ulcer Psychiatric:  Has normal mood and affect. Behavior is normal.     Assessment & Plan:

## 2016-04-12 NOTE — Progress Notes (Signed)
Pre visit review using our clinic review tool, if applicable. No additional management support is needed unless otherwise documented below in the visit note. 

## 2016-04-14 NOTE — Assessment & Plan Note (Signed)
stable overall by history and exam, recent data reviewed with pt, and pt to continue medical treatment as before,  to f/u any worsening symptoms or concerns Lab Results  Component Value Date   HGBA1C 5.5 04/10/2016

## 2016-04-14 NOTE — Assessment & Plan Note (Addendum)
Uncontrolled, for crestor 20 qd, lower chol diet,  to f/u any worsening symptoms or concerns  In addition to the time spent performing CPE, I spent an additional 25 minutes face to face,in which greater than 50% of this time was spent in counseling and coordination of care for patient's acute illness as documented.

## 2016-04-14 NOTE — Assessment & Plan Note (Signed)

## 2016-04-14 NOTE — Assessment & Plan Note (Signed)
With some suspiciin for malignancy, even melanoma, for skin cancer center referral, likely would need excision

## 2016-04-14 NOTE — Assessment & Plan Note (Signed)
Mild to mod, for otc zyrtec/nasacort,  to f/u any worsening symptoms or concerns

## 2016-04-24 ENCOUNTER — Encounter (INDEPENDENT_AMBULATORY_CARE_PROVIDER_SITE_OTHER): Payer: 59 | Admitting: Internal Medicine

## 2016-04-24 DIAGNOSIS — R918 Other nonspecific abnormal finding of lung field: Secondary | ICD-10-CM | POA: Diagnosis not present

## 2016-04-24 DIAGNOSIS — R0600 Dyspnea, unspecified: Secondary | ICD-10-CM

## 2016-04-24 LAB — PULMONARY FUNCTION TEST
DL/VA % PRED: 94 %
DL/VA: 4.25 ml/min/mmHg/L
DLCO COR % PRED: 77 %
DLCO COR: 23.6 ml/min/mmHg
DLCO unc % pred: 77 %
DLCO unc: 23.6 ml/min/mmHg
FEF 25-75 POST: 3.51 L/s
FEF 25-75 PRE: 2.66 L/s
FEF2575-%Change-Post: 31 %
FEF2575-%PRED-PRE: 126 %
FEF2575-%Pred-Post: 166 %
FEV1-%Change-Post: 5 %
FEV1-%Pred-Post: 93 %
FEV1-%Pred-Pre: 88 %
FEV1-Post: 2.69 L
FEV1-Pre: 2.55 L
FEV1FVC-%Change-Post: 6 %
FEV1FVC-%PRED-PRE: 110 %
FEV6-%Change-Post: 0 %
FEV6-%PRED-POST: 83 %
FEV6-%Pred-Pre: 83 %
FEV6-POST: 3.12 L
FEV6-Pre: 3.13 L
FEV6FVC-%CHANGE-POST: 0 %
FEV6FVC-%PRED-POST: 106 %
FEV6FVC-%Pred-Pre: 105 %
FVC-%Change-Post: 0 %
FVC-%PRED-POST: 78 %
FVC-%Pred-Pre: 79 %
FVC-Post: 3.13 L
FVC-Pre: 3.16 L
PRE FEV1/FVC RATIO: 81 %
Post FEV1/FVC ratio: 86 %
Post FEV6/FVC ratio: 100 %
Pre FEV6/FVC Ratio: 99 %
RV % pred: 83 %
RV: 2.06 L
TLC % pred: 83 %
TLC: 5.64 L

## 2016-05-02 ENCOUNTER — Telehealth: Payer: Self-pay | Admitting: Emergency Medicine

## 2016-05-02 NOTE — Telephone Encounter (Signed)
Unable to reach patient left message to give [place] a call back regarding referral

## 2016-05-02 NOTE — Telephone Encounter (Signed)
Pt called and stated you were sending him over to the cancer center and he hasnt heard anything back. Are you still wanting him to go to the cancer center or a dermatologist? Please advise thanks.

## 2016-06-07 DIAGNOSIS — D1801 Hemangioma of skin and subcutaneous tissue: Secondary | ICD-10-CM | POA: Diagnosis not present

## 2016-06-07 DIAGNOSIS — L821 Other seborrheic keratosis: Secondary | ICD-10-CM | POA: Diagnosis not present

## 2016-06-07 DIAGNOSIS — L570 Actinic keratosis: Secondary | ICD-10-CM | POA: Diagnosis not present

## 2016-06-13 ENCOUNTER — Telehealth: Payer: Self-pay | Admitting: Internal Medicine

## 2016-06-13 NOTE — Telephone Encounter (Signed)
Rec'd from [place] forward 2 pages to [place]

## 2016-06-16 ENCOUNTER — Other Ambulatory Visit: Payer: Self-pay | Admitting: Gastroenterology

## 2016-06-16 DIAGNOSIS — K2270 Barrett's esophagus without dysplasia: Secondary | ICD-10-CM

## 2016-08-07 ENCOUNTER — Emergency Department (HOSPITAL_COMMUNITY): Payer: 59

## 2016-08-07 ENCOUNTER — Encounter (HOSPITAL_COMMUNITY): Payer: Self-pay | Admitting: Emergency Medicine

## 2016-08-07 ENCOUNTER — Emergency Department (HOSPITAL_COMMUNITY)
Admission: EM | Admit: 2016-08-07 | Discharge: 2016-08-07 | Disposition: A | Discharge status: Home / Self Care | Payer: 59 | Attending: Emergency Medicine | Admitting: Emergency Medicine

## 2016-08-07 DIAGNOSIS — Z79899 Other long term (current) drug therapy: Secondary | ICD-10-CM | POA: Insufficient documentation

## 2016-08-07 DIAGNOSIS — K529 Noninfective gastroenteritis and colitis, unspecified: Secondary | ICD-10-CM

## 2016-08-07 DIAGNOSIS — R1084 Generalized abdominal pain: Secondary | ICD-10-CM | POA: Diagnosis present

## 2016-08-07 DIAGNOSIS — Z7982 Long term (current) use of aspirin: Secondary | ICD-10-CM | POA: Insufficient documentation

## 2016-08-07 LAB — COMPREHENSIVE METABOLIC PANEL
ALK PHOS: 71 U/L (ref 38–126)
ALT: 24 U/L (ref 17–63)
AST: 26 U/L (ref 15–41)
Albumin: 4.4 g/dL (ref 3.5–5.0)
Anion gap: 6 (ref 5–15)
BUN: 16 mg/dL (ref 6–20)
CALCIUM: 8.8 mg/dL — AB (ref 8.9–10.3)
CO2: 27 mmol/L (ref 22–32)
Chloride: 107 mmol/L (ref 101–111)
Creatinine, Ser: 1 mg/dL (ref 0.61–1.24)
GFR calc non Af Amer: >60 mL/min (ref >60)
Glucose, Bld: 94 mg/dL (ref 65–99)
Potassium: 4.6 mmol/L (ref 3.5–5.1)
SODIUM: 140 mmol/L (ref 135–145)
Total Bilirubin: 0.4 mg/dL (ref 0.3–1.2)
Total Protein: 7.2 g/dL (ref 6.5–8.1)

## 2016-08-07 LAB — URINALYSIS, ROUTINE W REFLEX MICROSCOPIC
Bilirubin Urine: NEGATIVE
Glucose, UA: NEGATIVE mg/dL
HGB URINE DIPSTICK: NEGATIVE
Ketones, ur: NEGATIVE mg/dL
LEUKOCYTES UA: NEGATIVE
NITRITE: NEGATIVE
Protein, ur: NEGATIVE mg/dL
SPECIFIC GRAVITY, URINE: 1.014 (ref 1.005–1.030)
pH: 7 (ref 5.0–8.0)

## 2016-08-07 LAB — LIPASE, BLOOD: LIPASE: 39 U/L (ref 11–51)

## 2016-08-07 LAB — CBC
HCT: 42.3 % (ref 39.0–52.0)
Hemoglobin: 13.9 g/dL (ref 13.0–17.0)
MCH: 29.7 pg (ref 26.0–34.0)
MCHC: 32.9 g/dL (ref 30.0–36.0)
MCV: 90.4 fL (ref 78.0–100.0)
Platelets: 254 10*3/uL (ref 150–400)
RBC: 4.68 MIL/uL (ref 4.22–5.81)
RDW: 13.1 % (ref 11.5–15.5)
WBC: 6.8 10*3/uL (ref 4.0–10.5)

## 2016-08-07 MED ORDER — SODIUM CHLORIDE 0.9 % IV BOLUS (SEPSIS)
1000.0000 mL | Freq: Once | INTRAVENOUS | Status: AC
  Administered 2016-08-07: 1000 mL via INTRAVENOUS

## 2016-08-07 MED ORDER — ONDANSETRON HCL 4 MG/2ML IJ SOLN
4.0000 mg | Freq: Once | INTRAMUSCULAR | Status: AC
  Administered 2016-08-07: 4 mg via INTRAVENOUS
  Filled 2016-08-07: qty 2

## 2016-08-07 MED ORDER — IOPAMIDOL (ISOVUE-300) INJECTION 61%
100.0000 mL | Freq: Once | INTRAVENOUS | Status: AC | PRN
  Administered 2016-08-07: 100 mL via INTRAVENOUS

## 2016-08-07 MED ORDER — ONDANSETRON HCL 4 MG PO TABS: 4.0000 mg | ORAL_TABLET | Freq: Four times a day (QID) | ORAL | 0 refills | Status: DC

## 2016-08-07 MED ORDER — IOPAMIDOL (ISOVUE-300) INJECTION 61%
INTRAVENOUS | Status: AC
  Filled 2016-08-07: qty 100

## 2016-08-07 NOTE — ED Provider Notes (Signed)
[facility] DEPT Provider Note   CSN: [id number] Arrival date & time: 08/07/16  [id number]   History   Chief Complaint Chief Complaint  Patient presents with  . Abdominal Pain    HPI DOUGLAS RACZ is a 76 y.o. male.  HPI   76 year old male presents today with complaints of abdominal pain. Patient reports approximately one week ago he started to have nausea vomiting and diarrhea. He reports he symptoms have continued persist with coming and going abdominal pain. Patient reports the pain is in the left upper left lower and right lower quadrant; worse after eating bean soup and vegetables.  He notes his abdomen feels slightly distended, denies any blood in his vomit or diarrhea. Patient denies any fevers, reports that he is able to eat small amount of soup but unable to tolerate large amount of fluids. Pt attempted using pepto bismol without improvement in his symptoms. Pt with several episodes of diarrhea this morning, no vomiting. No recent antibiotics.   Past Medical History:  Diagnosis Date  . Anemia   . Anxiety   . Arthritis   . B12 deficiency    on monthly injections  . Barrett's esophagus 03/2012  . Chest pain    2006 and 2011 ( neg nuclear stress test 2004) MI R/Oed  . Chest pain at rest 3/28-29/14  . Diverticulosis   . Elevated homocysteine ([place])   . Elevated PSA    due to prostatis  . GERD (gastroesophageal reflux disease)   . Headache(784.0)    spinal tap as OP   . Hyperlipidemia    NMR panel 2005, LDL 125 (1309/534), HDL 38, TG 87. LDL Goal=<130. [place] study LDL Goald=<160.   . Prostate nodule 2000   resolved w/o treatement; Dr Jeffie Pollock  . Tubular adenoma of colon 06/2006   Dr Sharlett Iles    Patient Active Problem List   Diagnosis Date Noted  . Skin lesion 04/12/2016  . Prediabetes 04/12/2016  . Allergic rhinitis 04/12/2016  . Dyspnea 02/17/2016  . Pulmonary nodules/lesions, multiple 02/17/2016  . Encounter for well adult exam with abnormal findings  02/17/2016  . Right lumbar radiculopathy 04/07/2015  . Obesity (BMI 30-39.9) 01/11/2014  . Barrett's esophagus 10/21/2012  . Chest pain 10/11/2012  . Hyperlipidemia 06/22/2010  . GERD 06/22/2010  . CERVICAL RADICULOPATHY, LEFT 05/31/2009  . Anxiety state, unspecified 01/10/2008  . ADENOMATOUS COLONIC POLYP 05/27/2007  . ANEMIA, PERNICIOUS, HX OF 05/27/2007    Past Surgical History:  Procedure Laterality Date  . ABDOMINAL HERNIA REPAIR  11/10/2010   mesh  . COLONOSCOPY W/ POLYPECTOMY  2001,2007,2013   F/U every 5 years; Dr Sharlett Iles  . KNEE ARTHROSCOPY Left 09/22/2010  . KNEE ARTHROSCOPY Right 01/02/2011  . PROSTATE BIOPSY  2000   Dr.Wrenn  . Rectal Fistula repair  07/2004  . REPLACEMENT TOTAL KNEE Right 02/10/2011  . ROTATOR CUFF REPAIR Right 2007   Dr.Jeff Beane  . UPPER GI ENDOSCOPY  2013   S/P dilation       Home Medications    Prior to Admission medications   Medication Sig Start Date End Date Taking? Authorizing Provider  acetaminophen (TYLENOL) 650 MG CR tablet Take 1,300 mg by mouth 2 (two) times daily as needed for pain.    Yes Historical Provider, MD  albuterol (PROVENTIL HFA;VENTOLIN HFA) 108 (90 Base) MCG/ACT inhaler Inhale 2 puffs into the lungs every 6 (six) hours as needed for wheezing or shortness of breath. 02/17/16  Yes Biagio Borg, MD  aspirin 81 MG EC  tablet Take 81 mg by mouth every evening.    Yes Historical Provider, MD  Cholecalciferol (VITAMIN D3) 1000 UNITS CAPS Take 1,000 Units by mouth every morning.    Yes Historical Provider, MD  cyanocobalamin (,VITAMIN B-12,) 1000 MCG/ML injection Inject 1 mL (1,000 mcg total) into the muscle every 30 (thirty) days. Keep sept appt w/new PCP for future refills 01/31/16  Yes Biagio Borg, MD  Multiple Vitamin (MULTIVITAMIN) capsule Take 1 capsule by mouth 2 (two) times daily.    Yes Historical Provider, MD  omeprazole (PRILOSEC) 40 MG capsule Take 1 capsule by mouth two times daily Patient taking differently: Take  40mg  capsule by mouth two times daily 03/28/16  Yes Ladene Artist, MD  OVER THE COUNTER MEDICATION Take 1 tablet by mouth daily.   Yes Historical Provider, MD  gabapentin (NEURONTIN) 100 MG capsule One pill every daily at bedtime as needed; dose may be increased by one pill each dose after 72 hours if only partially effective Patient not taking: Reported on 08/07/2016 04/06/15   Hendricks Limes, MD  ondansetron (ZOFRAN) 4 MG tablet Take 1 tablet (4 mg total) by mouth every 6 (six) hours. 08/07/16   Okey Regal, PA-C  rosuvastatin (CRESTOR) 20 MG tablet Take 1 tablet (20 mg total) by mouth daily. Patient not taking: Reported on 08/07/2016 04/12/16   Biagio Borg, MD  TUBERCULIN SYR 1CC/27GX1/2" (B-D TB SYRINGE 1CC/27GX1/2") 27G X 1/2" 1 ML MISC Use as directed to give B12 injections Must transfer care to new provifer for refills Patient not taking: Reported on 08/07/2016 08/09/15   [place], FNP    Family History Family History  Problem Relation Age of Onset  . Ovarian cancer Mother   . Brain cancer Maternal Aunt   . Crohn's disease Other     grandson  . Diabetes Neg Hx   . Stroke Neg Hx   . Heart disease Neg Hx   . Colon cancer Neg Hx   . Esophageal cancer Neg Hx   . Stomach cancer Neg Hx   . Pancreatic cancer Neg Hx   . Kidney disease Neg Hx   . Liver disease Neg Hx     Social History Social History  Substance Use Topics  . Smoking status: Never Smoker  . Smokeless tobacco: Never Used  . Alcohol use No     Allergies   Patient has no known allergies.   Review of Systems Review of Systems  All other systems reviewed and are negative.    Physical Exam Updated Vital Signs BP 124/83   Pulse 61   Temp 97.8 F (36.6 C) (Oral)   Resp 17   SpO2 98%   Physical Exam  Constitutional: He is oriented to person, place, and time. He appears well-developed and well-nourished.  HENT:  Head: Normocephalic and atraumatic.  Eyes: Conjunctivae are normal. Pupils are  equal, round, and reactive to light. Right eye exhibits no discharge. Left eye exhibits no discharge. No scleral icterus.  Neck: Normal range of motion. No JVD present. No tracheal deviation present.  Pulmonary/Chest: Effort normal. No stridor.  Abdominal: Soft. Bowel sounds are normal. He exhibits no distension and no mass. There is no tenderness. There is no rebound and no guarding. No hernia.  Musculoskeletal: He exhibits no edema.  Neurological: He is alert and oriented to person, place, and time. Coordination normal.  Skin: Skin is warm.  Psychiatric: He has a normal mood and affect. His behavior is normal. Judgment  and thought content normal.  Nursing note and vitals reviewed.    ED Treatments / Results  Labs (all labs ordered are listed, but only abnormal results are displayed) Labs Reviewed  COMPREHENSIVE METABOLIC PANEL - Abnormal; Notable for the following:       Result Value   Calcium 8.8 (*)    All other components within normal limits  LIPASE, BLOOD  CBC  URINALYSIS, ROUTINE W REFLEX MICROSCOPIC    EKG  EKG Interpretation None       Radiology Ct Abdomen Pelvis W Contrast  Result Date: 08/07/2016 CLINICAL DATA:  Nausea and vomiting for 1 week with abdominal pain, initial encounter EXAM: CT ABDOMEN AND PELVIS WITH CONTRAST TECHNIQUE: Multidetector CT imaging of the abdomen and pelvis was performed using the standard protocol following bolus administration of intravenous contrast. CONTRAST:  100 mL Isovue 300 COMPARISON:  06/28/2010 FINDINGS: Lower chest: Mild dependent atelectatic changes are noted in the bases bilaterally. No focal confluent infiltrate is seen. Stable nodular densities are noted in the bases bilaterally. Hepatobiliary: No focal liver abnormality is seen. No gallstones, gallbladder wall thickening, or biliary dilatation. Pancreas: Unremarkable. No pancreatic ductal dilatation or surrounding inflammatory changes. Spleen: Normal in size without focal  abnormality. Adrenals/Urinary Tract: The adrenal glands are within normal limits. Scattered left renal cysts are again noted stable in appearance from the prior exam. No calculi or obstructive changes are seen. Bladder is well distended without intraluminal filling defect. Stomach/Bowel: Scattered diverticular change of the colon is noted. The appendix is well visualized and within normal limits. No inflammatory changes are seen. No obstructive changes are noted. Vascular/Lymphatic: Aortic atherosclerosis. No enlarged abdominal or pelvic lymph nodes. Reproductive: Prostate is unremarkable. Other: No ascites is identified. The previously seen umbilical hernia is not as well visualized on the current exam. Musculoskeletal: Degenerative changes of the thoracolumbar spine are noted. No acute bony abnormality is seen. IMPRESSION: Diverticulosis without diverticulitis. No renal calculi or obstructive changes are seen. Stable pulmonary nodules from 2011.  No follow-up is necessary. No acute abnormality noted. Electronically Signed   By: Inez Catalina M.D.   On: 08/07/2016 15:24    Procedures Procedures (including critical care time)  Medications Ordered in ED Medications  sodium chloride 0.9 % bolus 1,000 mL (0 mLs Intravenous Stopped 08/07/16 1433)  ondansetron (ZOFRAN) injection 4 mg (4 mg Intravenous Given 08/07/16 1336)  iopamidol (ISOVUE-300) 61 % injection 100 mL (100 mLs Intravenous Contrast Given 08/07/16 1458)     Initial Impression / Assessment and Plan / ED Course  I have reviewed the triage vital signs and the nursing notes.  Pertinent labs & imaging results that were available during my care of the patient were reviewed by me and considered in my medical decision making (see chart for details).     Final Clinical Impressions(s) / ED Diagnoses   Final diagnoses:  Gastroenteritis    Labs: lipase, CMP, CBC, UA  Imaging:  CT Abdomen and pelvis   Consults:  Therapeutics:  zofran  Discharge Meds: zofran  Assessment/Plan: 76 year old male presents today with likely viral gastroenteritis. He is afebrile nontoxic, has a soft abdomen with when her discomfort. Nonsurgical abdomen. He has reassuring vital signs, reassuring laboratory analysis. Patient no longer having vomiting, still with diarrhea. Patient has not had any recent antibiotics, low suspicion for C. difficile infection.atient will be discharged home with close follow-up with primary care. Patient is given strict return precautions, he verbalized understanding and agreement to today's plan had no further questions or  concerns at the time discharge.     New Prescriptions New Prescriptions   ONDANSETRON (ZOFRAN) 4 MG TABLET    Take 1 tablet (4 mg total) by mouth every 6 (six) hours.     Okey Regal, PA-C 08/07/16 1636    Julianne Rice, MD 08/10/16 [phone]

## 2016-08-07 NOTE — ED Notes (Signed)
Patient transported to CT 

## 2016-08-07 NOTE — ED Notes (Signed)
ED Provider at bedside. 

## 2016-08-07 NOTE — Discharge Instructions (Signed)
Please read attached information. If you experience any new or worsening signs or symptoms please return to the emergency room for evaluation. Please follow-up with your primary care provider or specialist as discussed. Please use medication prescribed only as directed and discontinue taking if you have any concerning signs or symptoms.   °

## 2016-08-07 NOTE — ED Triage Notes (Signed)
Pt verbalizes ongoing n/v/d since 08/02/15.

## 2016-08-08 ENCOUNTER — Other Ambulatory Visit: Payer: Self-pay | Admitting: Gastroenterology

## 2016-08-08 DIAGNOSIS — K2270 Barrett's esophagus without dysplasia: Secondary | ICD-10-CM

## 2016-08-10 ENCOUNTER — Encounter: Payer: Self-pay | Admitting: Internal Medicine

## 2016-08-10 ENCOUNTER — Ambulatory Visit (INDEPENDENT_AMBULATORY_CARE_PROVIDER_SITE_OTHER): Payer: 59 | Admitting: Internal Medicine

## 2016-08-10 DIAGNOSIS — Z23 Encounter for immunization: Secondary | ICD-10-CM | POA: Diagnosis not present

## 2016-08-10 DIAGNOSIS — B349 Viral infection, unspecified: Secondary | ICD-10-CM

## 2016-08-10 NOTE — Assessment & Plan Note (Signed)
With recent abd pain/n/v/d now resolved. Recent ED eval c/w this, CT neg for acute, no longer requires antiemetic or other tx.  No further evaluation.

## 2016-08-10 NOTE — Patient Instructions (Signed)
You had the flu shot today  Please continue all other medications as before, and refills have been done if requested.  Please have the pharmacy call with any other refills you may need.  Please keep your appointments with your specialists as you may have planned

## 2016-08-10 NOTE — Progress Notes (Signed)
Subjective:    Patient ID: Adrian Burke, male    DOB: Dec 08, 1940, 76 y.o.   MRN: [id number]  HPI  Here after seen at ED Jan 22 with abd pain x 1 wk, after onset n/v/d, persistent with wax and wane pains.  Located primarily LUQ and RLQ with slight distension, not better with pepto bismol.  Otherwise - Denies worsening reflux, dysphagia, or blood.   Pt denies fever, wt loss, night sweats, loss of appetite, or other constitutional symptoms  Pt denies chest pain, increased sob or doe, wheezing, orthopnea, PND, increased LE swelling, palpitations, dizziness or syncope.  Tx in ED with NS bolus, zofran.  Vomiting improved and felt to have low suspicion for c diff.  Since jan 22 has had no further n/v, fever, worsening pain, and even diarrhea resolved with brat diet, now advance to prior diet.  No new complaints. Due for flu shot Past Medical History:  Diagnosis Date  . Anemia   . Anxiety   . Arthritis   . B12 deficiency    on monthly injections  . Barrett's esophagus 03/2012  . Chest pain    2006 and 2011 ( neg nuclear stress test 2004) MI R/Oed  . Chest pain at rest 3/28-29/14  . Diverticulosis   . Elevated homocysteine ([place])   . Elevated PSA    due to prostatis  . GERD (gastroesophageal reflux disease)   . Headache(784.0)    spinal tap as OP   . Hyperlipidemia    NMR panel 2005, LDL 125 (1309/534), HDL 38, TG 87. LDL Goal=<130. [place] study LDL Goald=<160.   . Prostate nodule 2000   resolved w/o treatement; Dr Jeffie Pollock  . Tubular adenoma of colon 06/2006   Dr Sharlett Iles   Past Surgical History:  Procedure Laterality Date  . ABDOMINAL HERNIA REPAIR  11/10/2010   mesh  . COLONOSCOPY W/ POLYPECTOMY  2001,2007,2013   F/U every 5 years; Dr Sharlett Iles  . KNEE ARTHROSCOPY Left 09/22/2010  . KNEE ARTHROSCOPY Right 01/02/2011  . PROSTATE BIOPSY  2000   Dr.Wrenn  . Rectal Fistula repair  07/2004  . REPLACEMENT TOTAL KNEE Right 02/10/2011  . ROTATOR CUFF REPAIR Right 2007   Dr.Jeff Beane  .  UPPER GI ENDOSCOPY  2013   S/P dilation    reports that he has never smoked. He has never used smokeless tobacco. He reports that he does not drink alcohol or use drugs. family history includes Brain cancer in his maternal aunt; Crohn's disease in his other; Ovarian cancer in his mother. No Known Allergies Current Outpatient Prescriptions on File Prior to Visit  Medication Sig Dispense Refill  . acetaminophen (TYLENOL) 650 MG CR tablet Take 1,300 mg by mouth 2 (two) times daily as needed for pain.     [name] albuterol (PROVENTIL HFA;VENTOLIN HFA) 108 (90 Base) MCG/ACT inhaler Inhale 2 puffs into the lungs every 6 (six) hours as needed for wheezing or shortness of breath. 1 Inhaler 11  . aspirin 81 MG EC tablet Take 81 mg by mouth every evening.     . Cholecalciferol (VITAMIN D3) 1000 UNITS CAPS Take 1,000 Units by mouth every morning.     . cyanocobalamin (,VITAMIN B-12,) 1000 MCG/ML injection Inject 1 mL (1,000 mcg total) into the muscle every 30 (thirty) days. Keep sept appt w/new PCP for future refills 30 mL 0  . gabapentin (NEURONTIN) 100 MG capsule One pill every daily at bedtime as needed; dose may be increased by one pill each dose after  72 hours if only partially effective 30 capsule 2  . Multiple Vitamin (MULTIVITAMIN) capsule Take 1 capsule by mouth 2 (two) times daily.     [name] omeprazole (PRILOSEC) 40 MG capsule Take 1 capsule by mouth two times daily (Patient taking differently: Take 40mg  capsule by mouth two times daily) 180 capsule 0  . ondansetron (ZOFRAN) 4 MG tablet Take 1 tablet (4 mg total) by mouth every 6 (six) hours. 12 tablet 0  . OVER THE COUNTER MEDICATION Take 1 tablet by mouth daily.    . rosuvastatin (CRESTOR) 20 MG tablet Take 1 tablet (20 mg total) by mouth daily. 90 tablet 3  . TUBERCULIN SYR 1CC/27GX1/2" (B-D TB SYRINGE 1CC/27GX1/2") 27G X 1/2" 1 ML MISC Use as directed to give B12 injections Must transfer care to new provifer for refills 50 each 0   No current  facility-administered medications on file prior to visit.    Review of Systems  All otherwise neg per pt    Objective:   Physical Exam BP 140/80   Pulse 72   Temp 98.3 F (36.8 C) (Oral)   Resp 20   Wt 215 lb (97.5 kg)   SpO2 96%   BMI 31.75 kg/m  VS noted, not ill appearing, smiling Constitutional: Pt appears in no apparent distress HENT: Head: NCAT.  Right Ear: External ear normal.  Left Ear: External ear normal.  Eyes: . Pupils are equal, round, and reactive to light. Conjunctivae and EOM are normal Neck: Normal range of motion. Neck supple.  Cardiovascular: Normal rate and regular rhythm.   Pulmonary/Chest: Effort normal and breath sounds without rales or wheezing.  Abd:  Soft, NT, ND, + BS - benign exam Neurological: Pt is alert. Not confused , motor grossly intact Skin: Skin is warm. No rash, no LE edema Psychiatric: Pt behavior is normal. No agitation.  No other new exam findings  Lab Results  Component Value Date   WBC 6.8 08/07/2016   HGB 13.9 08/07/2016   HCT 42.3 08/07/2016   PLT 254 08/07/2016   GLUCOSE 94 08/07/2016   CHOL 206 (H) 04/10/2016   TRIG 87.0 04/10/2016   HDL 50.30 04/10/2016   LDLDIRECT 144.2 03/04/2013   LDLCALC 138 (H) 04/10/2016   ALT 24 08/07/2016   AST 26 08/07/2016   NA 140 08/07/2016   K 4.6 08/07/2016   CL 107 08/07/2016   CREATININE 1.00 08/07/2016   BUN 16 08/07/2016   CO2 27 08/07/2016   TSH 1.42 04/10/2016   PSA 0.53 04/10/2016   INR 1.12 02/03/2011   HGBA1C 5.5 04/10/2016   MICROALBUR 0.9 04/10/2016   CT ABDOMEN AND PELVIS WITH CONTRAST 08/07/2016 IMPRESSION: Diverticulosis without diverticulitis.  No renal calculi or obstructive changes are seen.  Stable pulmonary nodules from 2011.  No follow-up is necessary.  No acute abnormality noted.     Assessment & Plan:

## 2016-08-10 NOTE — Progress Notes (Signed)
Pre visit review using our clinic review tool, if applicable. No additional management support is needed unless otherwise documented below in the visit note. 

## 2016-08-15 NOTE — Telephone Encounter (Signed)
Patient is requesting Dr. Jenny Reichmann to send a script for omeprazole to [facility] on [address].  States Dr. Fuller Plan would have to see him before he could prescribe this and had suggested patient call our office.

## 2016-08-15 NOTE — Telephone Encounter (Signed)
Patient states he is coming in July for a recall Colonoscopy and has never had to come into the office to get prescription refills. Informed patient Dr. Fuller Plan likes to see his patient's every year for refills and his over due for an office visit since 03/2016. I informed him if he wants to call his PCP, they can refill this medication if he sees them more frequently.  Patient states he will call his PCP because he just had a physical. Informed patient to call me back if he needs a refill and an office visit scheduled. Patient verbalized understanding.

## 2016-08-28 ENCOUNTER — Telehealth: Payer: Self-pay | Admitting: Gastroenterology

## 2016-08-28 DIAGNOSIS — K2270 Barrett's esophagus without dysplasia: Secondary | ICD-10-CM

## 2016-08-28 MED ORDER — OMEPRAZOLE 40 MG PO CPDR: DELAYED_RELEASE_CAPSULE | ORAL | 0 refills | Status: DC

## 2016-08-28 NOTE — Telephone Encounter (Signed)
Sent one refill to pharmacy. Informed patient to keep appt for any further refills since over due for his follow up visit.

## 2016-09-04 ENCOUNTER — Ambulatory Visit (INDEPENDENT_AMBULATORY_CARE_PROVIDER_SITE_OTHER): Payer: 59 | Admitting: Gastroenterology

## 2016-09-04 ENCOUNTER — Encounter: Payer: Self-pay | Admitting: Gastroenterology

## 2016-09-04 VITALS — BP 150/78 | HR 88 | Ht 67.5 in | Wt 214.5 lb

## 2016-09-04 DIAGNOSIS — Z86010 Personal history of colonic polyps: Secondary | ICD-10-CM | POA: Diagnosis not present

## 2016-09-04 DIAGNOSIS — K2270 Barrett's esophagus without dysplasia: Secondary | ICD-10-CM | POA: Diagnosis not present

## 2016-09-04 DIAGNOSIS — K219 Gastro-esophageal reflux disease without esophagitis: Secondary | ICD-10-CM | POA: Diagnosis not present

## 2016-09-04 MED ORDER — OMEPRAZOLE 40 MG PO CPDR: DELAYED_RELEASE_CAPSULE | ORAL | 3 refills | Status: DC

## 2016-09-04 NOTE — Progress Notes (Signed)
    History of Present Illness: This is a 76 year old male returning for follow-up of GERD with short segment Barrett's esophagus without dysplasia. Last EGD performed in September 2016 with a 3 year surveillance interval recommended. His reflux symptoms are well controlled on his current regimen. He has no gastrointestinal complaints.  Current Medications, Allergies, Past Medical History, Past Surgical History, Family History and Social History were reviewed in [employer] record.  Physical Exam: General: Well developed, well nourished, no acute distress Head: Normocephalic and atraumatic Eyes:  sclerae anicteric, EOMI Ears: Normal auditory acuity Mouth: No deformity or lesions Lungs: Clear throughout to auscultation Heart: Regular rate and rhythm; no murmurs, rubs or bruits Abdomen: Soft, non tender and non distended. No masses, hepatosplenomegaly or hernias noted. Normal Bowel sounds Musculoskeletal: Symmetrical with no gross deformities  Pulses:  Normal pulses noted Extremities: No clubbing, cyanosis, edema or deformities noted Neurological: Alert oriented x 4, grossly nonfocal Psychological:  Alert and cooperative. Normal mood and affect  Assessment and Recommendations:  1. GERD. Short segment Barrett's esophagus without dysplasia. Continue standard antireflux measures and omeprazole 40 mg daily. 3 year interval EGD is planned for September 2019.  2. Personal history of adenomatous colon polyps. Five-year interval surveillance colonoscopy is recommended in July 2020.

## 2016-09-04 NOTE — Patient Instructions (Signed)
We have sent the following prescriptions to your mail in pharmacy: omeprazole.   If you have not heard from your mail in pharmacy within 1 week or if you have not received your medication in the mail, please contact us at [phone] so we may find out why.  Thank you for choosing me and [facility].  Malcolm T. Stark, Jr., MD., FACG  

## 2016-10-30 DIAGNOSIS — H6121 Impacted cerumen, right ear: Secondary | ICD-10-CM | POA: Diagnosis not present

## 2016-10-30 DIAGNOSIS — H9201 Otalgia, right ear: Secondary | ICD-10-CM | POA: Diagnosis not present

## 2016-11-27 ENCOUNTER — Other Ambulatory Visit: Payer: Self-pay | Admitting: *Deleted

## 2016-11-27 MED ORDER — "TUBERCULIN SYRINGE 27G X 1/2"" 1 ML MISC": 0 refills | Status: DC

## 2016-12-05 ENCOUNTER — Encounter: Payer: Self-pay | Admitting: Gastroenterology

## 2016-12-06 ENCOUNTER — Other Ambulatory Visit: Payer: Self-pay | Admitting: Internal Medicine

## 2016-12-06 ENCOUNTER — Encounter: Payer: Self-pay | Admitting: Internal Medicine

## 2016-12-06 ENCOUNTER — Other Ambulatory Visit (INDEPENDENT_AMBULATORY_CARE_PROVIDER_SITE_OTHER): Payer: 59

## 2016-12-06 ENCOUNTER — Telehealth: Payer: Self-pay

## 2016-12-06 ENCOUNTER — Ambulatory Visit (INDEPENDENT_AMBULATORY_CARE_PROVIDER_SITE_OTHER): Payer: 59 | Admitting: Internal Medicine

## 2016-12-06 VITALS — BP 136/84 | HR 70 | Ht 68.5 in | Wt 212.0 lb

## 2016-12-06 DIAGNOSIS — R7303 Prediabetes: Secondary | ICD-10-CM

## 2016-12-06 DIAGNOSIS — M545 Low back pain, unspecified: Secondary | ICD-10-CM

## 2016-12-06 DIAGNOSIS — E538 Deficiency of other specified B group vitamins: Secondary | ICD-10-CM

## 2016-12-06 DIAGNOSIS — Z0001 Encounter for general adult medical examination with abnormal findings: Secondary | ICD-10-CM

## 2016-12-06 LAB — HEMOGLOBIN A1C: Hgb A1c MFr Bld: 5.9 % (ref 4.6–6.5)

## 2016-12-06 LAB — CBC WITH DIFFERENTIAL/PLATELET
BASOS PCT: 1.4 % (ref 0.0–3.0)
Basophils Absolute: 0.1 10*3/uL (ref 0.0–0.1)
EOS PCT: 3.9 % (ref 0.0–5.0)
Eosinophils Absolute: 0.2 10*3/uL (ref 0.0–0.7)
HEMATOCRIT: 41.3 % (ref 39.0–52.0)
HEMOGLOBIN: 14.1 g/dL (ref 13.0–17.0)
LYMPHS PCT: 25 % (ref 12.0–46.0)
Lymphs Abs: 1.4 10*3/uL (ref 0.7–4.0)
MCHC: 34 g/dL (ref 30.0–36.0)
MCV: 90.1 fl (ref 78.0–100.0)
Monocytes Absolute: 0.5 10*3/uL (ref 0.1–1.0)
Monocytes Relative: 9.3 % (ref 3.0–12.0)
NEUTROS ABS: 3.3 10*3/uL (ref 1.4–7.7)
Neutrophils Relative %: 60.4 % (ref 43.0–77.0)
Platelets: 252 10*3/uL (ref 150.0–400.0)
RBC: 4.59 Mil/uL (ref 4.22–5.81)
RDW: 13.6 % (ref 11.5–15.5)
WBC: 5.5 10*3/uL (ref 4.0–10.5)

## 2016-12-06 LAB — URINALYSIS, ROUTINE W REFLEX MICROSCOPIC
BILIRUBIN URINE: NEGATIVE
Hgb urine dipstick: NEGATIVE
KETONES UR: NEGATIVE
LEUKOCYTES UA: NEGATIVE
Nitrite: NEGATIVE
TOTAL PROTEIN, URINE-UPE24: NEGATIVE
UROBILINOGEN UA: 0.2 (ref 0.0–1.0)
Urine Glucose: NEGATIVE
pH: 7.5 (ref 5.0–8.0)

## 2016-12-06 LAB — LIPID PANEL
CHOL/HDL RATIO: 5
Cholesterol: 239 mg/dL — ABNORMAL HIGH (ref 0–200)
HDL: 46.8 mg/dL (ref >39.00)
LDL Cholesterol: 163 mg/dL — ABNORMAL HIGH (ref 0–99)
NONHDL: 191.7
Triglycerides: 145 mg/dL (ref 0.0–149.0)
VLDL: 29 mg/dL (ref 0.0–40.0)

## 2016-12-06 LAB — BASIC METABOLIC PANEL
BUN: 16 mg/dL (ref 6–23)
CO2: 27 mEq/L (ref 19–32)
CREATININE: 0.96 mg/dL (ref 0.40–1.50)
Calcium: 9.7 mg/dL (ref 8.4–10.5)
Chloride: 104 mEq/L (ref 96–112)
GFR: 81.07 mL/min (ref >60.00)
Glucose, Bld: 96 mg/dL (ref 70–99)
Potassium: 4.5 mEq/L (ref 3.5–5.1)
Sodium: 140 mEq/L (ref 135–145)

## 2016-12-06 LAB — HEPATIC FUNCTION PANEL
ALT: 24 U/L (ref 0–53)
AST: 27 U/L (ref 0–37)
Albumin: 4.4 g/dL (ref 3.5–5.2)
Alkaline Phosphatase: 67 U/L (ref 39–117)
BILIRUBIN DIRECT: 0.1 mg/dL (ref 0.0–0.3)
BILIRUBIN TOTAL: 0.6 mg/dL (ref 0.2–1.2)
Total Protein: 6.9 g/dL (ref 6.0–8.3)

## 2016-12-06 LAB — PSA: PSA: 0.7 ng/mL (ref 0.10–4.00)

## 2016-12-06 LAB — TSH: TSH: 1.43 u[IU]/mL (ref 0.35–4.50)

## 2016-12-06 MED ORDER — TIZANIDINE HCL 4 MG PO TABS: 4.0000 mg | ORAL_TABLET | Freq: Four times a day (QID) | ORAL | 0 refills | Status: DC | PRN

## 2016-12-06 MED ORDER — CYANOCOBALAMIN 1000 MCG/ML IJ SOLN
1000.0000 ug | Freq: Once | INTRAMUSCULAR | Status: AC
  Administered 2016-12-06: 1000 ug via INTRAMUSCULAR

## 2016-12-06 MED ORDER — ROSUVASTATIN CALCIUM 20 MG PO TABS: 20.0000 mg | ORAL_TABLET | Freq: Every day | ORAL | 3 refills | Status: DC

## 2016-12-06 MED ORDER — TRAMADOL HCL 50 MG PO TABS: 50.0000 mg | ORAL_TABLET | Freq: Three times a day (TID) | ORAL | 0 refills | Status: DC | PRN

## 2016-12-06 NOTE — Telephone Encounter (Signed)
Called pt, LVM.   

## 2016-12-06 NOTE — Telephone Encounter (Signed)
Pt called back in and was informed of results and expressed understanding.

## 2016-12-06 NOTE — Progress Notes (Signed)
Subjective:    Patient ID: Adrian Burke, male    DOB: [birth year], 76 y.o.   MRN: [id number]  HPI  Here for wellness and f/u;  Overall doing ok;  Pt denies Chest pain, worsening SOB, DOE, wheezing, orthopnea, PND, worsening LE edema, palpitations, dizziness or syncope.  Pt denies neurological change such as new headache, facial or extremity weakness.  Pt denies polydipsia, polyuria, or low sugar symptoms. Pt states overall good compliance with treatment and medications, good tolerability, and has been trying to follow appropriate diet.  Pt denies worsening depressive symptoms, suicidal ideation or panic. No fever, night sweats, wt loss, loss of appetite, or other constitutional symptoms.  Pt states good ability with ADL's, has low fall risk, home safety reviewed and adequate, no other significant changes in hearing or vision, and only occasionally active with exercise. Due for B12, o/w no change in hx. Also Pt c/o new onset left LBP without bowel or bladder change, fever, wt loss,  worsening LE pain/numbness/weakness, gait change or falls, but tylenol not helping and nothing else makes better or worse.  Denies urinary symptoms such as dysuria, frequency, urgency, flank pain, hematuria or n/v, fever, chills.  Denies worsening reflux, abd pain, dysphagia, n/v, bowel change or blood. Past Medical History:  Diagnosis Date  . Anemia   . Anxiety   . Arthritis   . B12 deficiency    on monthly injections  . Barrett's esophagus 03/2012  . Chest pain    2006 and 2011 ( neg nuclear stress test 2004) MI R/Oed  . Chest pain at rest 3/28-29/14  . Diverticulosis   . Elevated homocysteine ([facility])   . Elevated PSA    due to prostatis  . GERD (gastroesophageal reflux disease)   . Headache(784.0)    spinal tap as OP   . Hyperlipidemia    NMR panel 2005, LDL 125 (1309/534), HDL 38, TG 87. LDL Goal=<130. [place] study LDL Goald=<160.   . Prostate nodule 2000   resolved w/o treatement; Dr Jeffie Pollock  . Tubular  adenoma of colon 06/2006   Dr Sharlett Iles   Past Surgical History:  Procedure Laterality Date  . ABDOMINAL HERNIA REPAIR  11/10/2010   mesh  . COLONOSCOPY W/ POLYPECTOMY  2001,2007,2013   F/U every 5 years; Dr Sharlett Iles  . KNEE ARTHROSCOPY Left 09/22/2010  . KNEE ARTHROSCOPY Right 01/02/2011  . PROSTATE BIOPSY  2000   Dr.Wrenn  . Rectal Fistula repair  07/2004  . REPLACEMENT TOTAL KNEE Right 02/10/2011  . ROTATOR CUFF REPAIR Right 2007   Dr.Jeff Beane  . UPPER GI ENDOSCOPY  2013   S/P dilation    reports that he has never smoked. He has never used smokeless tobacco. He reports that he does not drink alcohol or use drugs. family history includes Brain cancer in his maternal aunt; Crohn's disease in his other; Ovarian cancer in his mother. No Known Allergies Current Outpatient Prescriptions on File Prior to Visit  Medication Sig Dispense Refill  . acetaminophen (TYLENOL) 650 MG CR tablet Take 1,300 mg by mouth 2 (two) times daily as needed for pain.     [name] albuterol (PROVENTIL HFA;VENTOLIN HFA) 108 (90 Base) MCG/ACT inhaler Inhale 2 puffs into the lungs every 6 (six) hours as needed for wheezing or shortness of breath. 1 Inhaler 11  . aspirin 81 MG EC tablet Take 81 mg by mouth every evening.     . Cholecalciferol (VITAMIN D3) 1000 UNITS CAPS Take 1,000 Units by mouth every morning.     [name]  cyanocobalamin (,VITAMIN B-12,) 1000 MCG/ML injection Inject 1 mL (1,000 mcg total) into the muscle every 30 (thirty) days. Keep sept appt w/new PCP for future refills 30 mL 0  . Multiple Vitamin (MULTIVITAMIN) capsule Take 1 capsule by mouth 2 (two) times daily.     [name] omeprazole (PRILOSEC) 40 MG capsule Take 1 capsule by mouth two times daily 180 capsule 3  . OVER THE COUNTER MEDICATION Take 1 tablet by mouth daily. Memory tablet    . TUBERCULIN SYR 1CC/27GX1/2" (B-D TB SYRINGE 1CC/27GX1/2") 27G X 1/2" 1 ML MISC Use monthly to give B12 injections 12 each 0   No current facility-administered medications on  file prior to visit.    Review of Systems Constitutional: Negative for other unusual diaphoresis, sweats, appetite or weight changes HENT: Negative for other worsening hearing loss, ear pain, facial swelling, mouth sores or neck stiffness.   Eyes: Negative for other worsening pain, redness or other visual disturbance.  Respiratory: Negative for other stridor or swelling Cardiovascular: Negative for other palpitations or other chest pain  Gastrointestinal: Negative for worsening diarrhea or loose stools, blood in stool, distention or other pain Genitourinary: Negative for hematuria, flank pain or other change in urine volume.  Musculoskeletal: Negative for myalgias or other joint swelling.  Skin: Negative for other color change, or other wound or worsening drainage.  Neurological: Negative for other syncope or numbness. Hematological: Negative for other adenopathy or swelling Psychiatric/Behavioral: Negative for hallucinations, other worsening agitation, SI, self-injury, or new decreased concentration All other system neg per pt    Objective:   Physical Exam BP 136/84   Pulse 70   Ht 5' 8.5" (1.74 m)   Wt 212 lb (96.2 kg)   SpO2 99%   BMI 31.77 kg/m  VS noted,  Constitutional: Pt is oriented to person, place, and time. Appears well-developed and well-nourished, in no significant distress and comfortable Head: Normocephalic and atraumatic  Eyes: Conjunctivae and EOM are normal. Pupils are equal, round, and reactive to light Right Ear: External ear normal without discharge Left Ear: External ear normal without discharge Nose: Nose without discharge or deformity Mouth/Throat: Oropharynx is without other ulcerations and moist  Neck: Normal range of motion. Neck supple. No JVD present. No tracheal deviation present or significant neck LA or mass Cardiovascular: Normal rate, regular rhythm, normal heart sounds and intact distal pulses.   Pulmonary/Chest: WOB normal and breath sounds  without rales or wheezing  Abdominal: Soft. Bowel sounds are normal. NT. No HSM  Musculoskeletal: Normal range of motion. Exhibits no edema Lymphadenopathy: Has no other cervical adenopathy.  Neurological: Pt is alert and oriented to person, place, and time. Pt has normal reflexes. No cranial nerve deficit. Motor grossly intact, Gait intact Skin: Skin is warm and dry. No rash noted or new ulcerations Psychiatric:  Has normal mood and affect. Behavior is normal without agitation No other exam findings     Assessment & Plan:

## 2016-12-06 NOTE — Telephone Encounter (Signed)
-----   Message from Biagio Borg, MD sent at 12/06/2016  4:06 PM EDT ----- Left message on MyChart, pt to cont same tx except  The test results show that your current treatment is OK, except the LDL cholesterol is quite high, placing you at higher risk for heart disease and stroke.  We should start a new statin medication for you called crestor 20 mg per day, and follow a lower cholesterol diet. Redmond Baseman to please inform pt, I will do rx

## 2016-12-06 NOTE — Patient Instructions (Signed)
You had the B12 shot today  Please take all new medication as prescribed - the pain medication and the muscle relaxer as needed  Please continue all other medications as before, and refills have been done if requested.  Please have the pharmacy call with any other refills you may need.  Please continue your efforts at being more active, low cholesterol diet, and weight control.  You are otherwise up to date with prevention measures today.  Please keep your appointments with your specialists as you may have planned  Please go to the LAB in the Basement (turn left off the elevator) for the tests to be done today  You will be contacted by phone if any changes need to be made immediately.  Otherwise, you will receive a letter about your results with an explanation, but please check with MyChart first.  Please remember to sign up for MyChart if you have not done so, as this will be important to you in the future with finding out test results, communicating by private email, and scheduling acute appointments online when needed.  If you have Medicare related insurance (such as traditional Medicare, [employer] or [employer], or similar), Please make an appointment at the [facility] with Sharee Pimple, the [employer], for your Wellness Visit in this office, which is a benefit with your insurance.  Please return in 1 year for your yearly visit, or sooner if needed, with Lab testing done 3-5 days before

## 2016-12-08 NOTE — Assessment & Plan Note (Signed)
Asympt,  to f/u any worsening symptoms or concerns,  Lab Results  Component Value Date   HGBA1C 5.9 12/06/2016

## 2016-12-08 NOTE — Assessment & Plan Note (Signed)

## 2016-12-08 NOTE — Assessment & Plan Note (Addendum)
C/w msk strain, for pain control and muscle relaxer prn,  to f/u any worsening symptoms or concerns  In addition to the time spent performing CPE, I spent an additional 15 minutes face to face,in which greater than 50% of this time was spent in counseling and coordination of care for patient's illness as documented, including the differential diagnosis of lower back pain, as well as indications and types of further evaluation and tx

## 2016-12-13 ENCOUNTER — Telehealth: Payer: Self-pay | Admitting: Gastroenterology

## 2016-12-13 ENCOUNTER — Encounter: Payer: Self-pay | Admitting: Gastroenterology

## 2016-12-13 DIAGNOSIS — K2270 Barrett's esophagus without dysplasia: Secondary | ICD-10-CM

## 2016-12-13 MED ORDER — OMEPRAZOLE 40 MG PO CPDR: DELAYED_RELEASE_CAPSULE | ORAL | 1 refills | Status: DC

## 2016-12-13 NOTE — Telephone Encounter (Signed)
Prescription sent to patient's mail order pharmacy. Left a message for patient to return my call.

## 2016-12-14 NOTE — Telephone Encounter (Signed)
Informed patient that I sent his prescription to [facility].

## 2017-01-09 ENCOUNTER — Ambulatory Visit (AMBULATORY_SURGERY_CENTER): Payer: Self-pay | Admitting: *Deleted

## 2017-01-09 VITALS — Ht 68.5 in | Wt 215.0 lb

## 2017-01-09 DIAGNOSIS — Z86010 Personal history of colonic polyps: Secondary | ICD-10-CM

## 2017-01-09 MED ORDER — NA SULFATE-K SULFATE-MG SULF 17.5-3.13-1.6 GM/177ML PO SOLN: ORAL | 0 refills | Status: DC

## 2017-01-09 NOTE — Progress Notes (Signed)
No allergies to eggs or soy. No problems with anesthesia.  Pt given Emmi instructions for colonoscopy  No oxygen use  No diet drug use  

## 2017-01-24 ENCOUNTER — Encounter: Payer: 59 | Admitting: Gastroenterology

## 2017-01-30 ENCOUNTER — Telehealth: Payer: Self-pay | Admitting: Gastroenterology

## 2017-01-30 NOTE — Telephone Encounter (Signed)
Patient notified that we do not prescribed antibiotics for this, he is encouraged to contact his orthopedic MD for a rx if it is indicated.

## 2017-01-31 ENCOUNTER — Encounter: Payer: Self-pay | Admitting: Gastroenterology

## 2017-02-07 ENCOUNTER — Ambulatory Visit (AMBULATORY_SURGERY_CENTER): Payer: 59 | Admitting: Gastroenterology

## 2017-02-07 ENCOUNTER — Encounter: Payer: Self-pay | Admitting: Gastroenterology

## 2017-02-07 VITALS — BP 123/71 | HR 55 | Temp 98.4°F | Resp 21 | Ht 68.5 in | Wt 215.0 lb

## 2017-02-07 DIAGNOSIS — Z86010 Personal history of colonic polyps: Secondary | ICD-10-CM

## 2017-02-07 DIAGNOSIS — K219 Gastro-esophageal reflux disease without esophagitis: Secondary | ICD-10-CM | POA: Diagnosis not present

## 2017-02-07 DIAGNOSIS — K2270 Barrett's esophagus without dysplasia: Secondary | ICD-10-CM | POA: Diagnosis not present

## 2017-02-07 DIAGNOSIS — D123 Benign neoplasm of transverse colon: Secondary | ICD-10-CM | POA: Diagnosis not present

## 2017-02-07 MED ORDER — SODIUM CHLORIDE 0.9 % IV SOLN: 500.0000 mL | INTRAVENOUS | Status: DC

## 2017-02-07 NOTE — Patient Instructions (Signed)
YOU HAD AN ENDOSCOPIC PROCEDURE TODAY AT [facility]:   Refer to the procedure report that was given to you for any specific questions about what was found during the examination.  If the procedure report does not answer your questions, please call your gastroenterologist to clarify.  If you requested that your care partner not be given the details of your procedure findings, then the procedure report has been included in a sealed envelope for you to review at your convenience later.  YOU SHOULD EXPECT: Some feelings of bloating in the abdomen. Passage of more gas than usual.  Walking can help get rid of the air that was put into your GI tract during the procedure and reduce the bloating. If you had a lower endoscopy (such as a colonoscopy or flexible sigmoidoscopy) you may notice spotting of blood in your stool or on the toilet paper. If you underwent a bowel prep for your procedure, you may not have a normal bowel movement for a few days.  Please Note:  You might notice some irritation and congestion in your nose or some drainage.  This is from the oxygen used during your procedure.  There is no need for concern and it should clear up in a day or so.  SYMPTOMS TO REPORT IMMEDIATELY:   Following lower endoscopy (colonoscopy or flexible sigmoidoscopy):  Excessive amounts of blood in the stool  Significant tenderness or worsening of abdominal pains  Swelling of the abdomen that is new, acute  Fever of 100F or higher  For urgent or emergent issues, a gastroenterologist can be reached at any hour by calling [phone].   DIET:  We do recommend a small meal at first, but then you may proceed to your regular diet.  Drink plenty of fluids but you should avoid alcoholic beverages for 24 hours.  ACTIVITY:  You should plan to take it easy for the rest of today and you should NOT DRIVE or use heavy machinery until tomorrow (because of the sedation medicines used during the test).     FOLLOW UP: Our staff will call the number listed on your records the next business day following your procedure to check on you and address any questions or concerns that you may have regarding the information given to you following your procedure. If we do not reach you, we will leave a message.  However, if you are feeling well and you are not experiencing any problems, there is no need to return our call.  We will assume that you have returned to your regular daily activities without incident.  If any biopsies were taken you will be contacted by phone or by letter within the next 1-3 weeks.  Please call us at [phone] if you have not heard about the biopsies in 3 weeks.    SIGNATURES/CONFIDENTIALITY: You and/or your care partner have signed paperwork which will be entered into your electronic medical record.  These signatures attest to the fact that that the information above on your After Visit Summary has been reviewed and is understood.  Full responsibility of the confidentiality of this discharge information lies with you and/or your care-partner.  Polyp, diverticulosis, hemorrhoid information given.  High fiber diet information given  No recall colonoscopy .

## 2017-02-07 NOTE — Progress Notes (Signed)
Called to room to assist during endoscopic procedure.  Patient ID and intended procedure confirmed with present staff. Received instructions for my participation in the procedure from the performing physician.  

## 2017-02-07 NOTE — Op Note (Signed)
[facility] Patient Name: Hezekiah Veltre Procedure Date: 02/07/2017 8:08 AM MRN: [id number] Endoscopist: Ladene Artist , MD Age: 76 Referring MD:  Date of Birth: 04-08-1941 Gender: Male Account #: [id number] Procedure:                Colonoscopy Indications:              Surveillance: Personal history of adenomatous                            polyps on last colonoscopy 5 years ago Medicines:                Monitored Anesthesia Care Procedure:                Pre-Anesthesia Assessment:                           - Prior to the procedure, a History and Physical                            was performed, and patient medications and                            allergies were reviewed. The patient's tolerance of                            previous anesthesia was also reviewed. The risks                            and benefits of the procedure and the sedation                            options and risks were discussed with the patient.                            All questions were answered, and informed consent                            was obtained. Prior Anticoagulants: The patient has                            taken no previous anticoagulant or antiplatelet                            agents. ASA Grade Assessment: II - A patient with                            mild systemic disease. After reviewing the risks                            and benefits, the patient was deemed in                            satisfactory condition to undergo the procedure.  After obtaining informed consent, the colonoscope                            was passed under direct vision. Throughout the                            procedure, the patient's blood pressure, pulse, and                            oxygen saturations were monitored continuously. The                            Model PCF-H190DL [id number]) scope was introduced                            through the anus and  advanced to the the cecum,                            identified by appendiceal orifice and ileocecal                            valve. The ileocecal valve, appendiceal orifice,                            and rectum were photographed. The quality of the                            bowel preparation was adequate. The colonoscopy was                            performed without difficulty. The patient tolerated                            the procedure well. Scope In: 8:13:24 AM Scope Out: 8:27:49 AM Scope Withdrawal Time: 0 hours 13 minutes 5 seconds  Total Procedure Duration: 0 hours 14 minutes 25 seconds  Findings:                 The perianal and digital rectal examinations were                            normal.                           Two sessile polyps were found in the transverse                            colon. The polyps were 4 to 5 mm in size. These                            polyps were removed with a cold snare. Resection                            and retrieval were complete.  Internal hemorrhoids were found during                            retroflexion. The hemorrhoids were small and Grade                            I (internal hemorrhoids that do not prolapse).                           Multiple medium-mouthed diverticula were found in                            the sigmoid colon, descending colon and transverse                            colon. There was no evidence of diverticular                            bleeding.                           The exam was otherwise without abnormality on                            direct and retroflexion views. Complications:            No immediate complications. Estimated blood loss:                            None. Estimated Blood Loss:     Estimated blood loss: none. Impression:               - Two 4 to 5 mm polyps in the transverse colon,                            removed with a cold snare. Resected and  retrieved.                           - Internal hemorrhoids.                           - Moderate diverticulosis in the sigmoid colon, in                            the descending colon and in the transverse colon.                            There was no evidence of diverticular bleeding.                           - The examination was otherwise normal on direct                            and retroflexion views. Recommendation:           - Patient has a contact number available for  emergencies. The signs and symptoms of potential                            delayed complications were discussed with the                            patient. Return to normal activities tomorrow.                            Written discharge instructions were provided to the                            patient.                           - High fiber diet.                           - Continue present medications.                           - Await pathology results.                           - No repeat colonoscopy due to age. Ladene Artist, MD 02/07/2017 8:39:39 AM This report has been signed electronically.

## 2017-02-07 NOTE — Progress Notes (Signed)
Pt took 2 Keflex pills with sip of water at 700- ok to proceed per J Monday CRNA  No changes in medical or surgical hx since PV

## 2017-02-07 NOTE — Progress Notes (Signed)
Report to PACU, RN, vss, BBS= Clear.  

## 2017-02-08 ENCOUNTER — Telehealth: Payer: Self-pay | Admitting: *Deleted

## 2017-02-08 NOTE — Telephone Encounter (Signed)
  Follow up Call-  Call back number 02/07/2017 04/16/2015  Post procedure Call Back phone  # [phone]  Permission to leave phone message Yes Yes  Some recent data might be hidden     Patient questions:  Do you have a fever, pain , or abdominal swelling? No. Pain Score  0 *  Have you tolerated food without any problems? Yes.    Have you been able to return to your normal activities? Yes.    Do you have any questions about your discharge instructions: Diet   No. Medications  No. Follow up visit  No.  Do you have questions or concerns about your Care? No.  Actions: * If pain score is 4 or above: No action needed, pain <4.

## 2017-02-17 ENCOUNTER — Other Ambulatory Visit: Payer: Self-pay | Admitting: Internal Medicine

## 2017-02-20 NOTE — Telephone Encounter (Signed)
Please advise 

## 2017-02-25 ENCOUNTER — Encounter: Payer: Self-pay | Admitting: Gastroenterology

## 2017-05-27 ENCOUNTER — Other Ambulatory Visit: Payer: Self-pay | Admitting: Gastroenterology

## 2017-05-27 DIAGNOSIS — K2270 Barrett's esophagus without dysplasia: Secondary | ICD-10-CM

## 2017-06-11 ENCOUNTER — Telehealth: Payer: Self-pay | Admitting: Gastroenterology

## 2017-06-11 DIAGNOSIS — K2270 Barrett's esophagus without dysplasia: Secondary | ICD-10-CM

## 2017-06-11 MED ORDER — OMEPRAZOLE 40 MG PO CPDR: DELAYED_RELEASE_CAPSULE | ORAL | 1 refills | Status: DC

## 2017-06-11 NOTE — Telephone Encounter (Signed)
Prescription sent to patient's mail order pharmacy. Patient notified and he verbalized understanding.

## 2017-08-22 ENCOUNTER — Encounter: Payer: Self-pay | Admitting: Internal Medicine

## 2017-08-22 ENCOUNTER — Ambulatory Visit (INDEPENDENT_AMBULATORY_CARE_PROVIDER_SITE_OTHER): Payer: 59 | Admitting: Internal Medicine

## 2017-08-22 ENCOUNTER — Ambulatory Visit (INDEPENDENT_AMBULATORY_CARE_PROVIDER_SITE_OTHER)
Admission: RE | Admit: 2017-08-22 | Discharge: 2017-08-22 | Disposition: A | Discharge status: Home / Self Care | Payer: 59 | Source: Ambulatory Visit | Attending: Internal Medicine | Admitting: Internal Medicine

## 2017-08-22 VITALS — BP 120/84 | HR 81 | Temp 98.3°F | Ht 68.5 in | Wt 220.0 lb

## 2017-08-22 DIAGNOSIS — R05 Cough: Secondary | ICD-10-CM

## 2017-08-22 DIAGNOSIS — R7303 Prediabetes: Secondary | ICD-10-CM | POA: Diagnosis not present

## 2017-08-22 DIAGNOSIS — Z862 Personal history of diseases of the blood and blood-forming organs and certain disorders involving the immune mechanism: Secondary | ICD-10-CM

## 2017-08-22 DIAGNOSIS — E538 Deficiency of other specified B group vitamins: Secondary | ICD-10-CM | POA: Diagnosis not present

## 2017-08-22 DIAGNOSIS — R062 Wheezing: Secondary | ICD-10-CM

## 2017-08-22 DIAGNOSIS — R059 Cough, unspecified: Secondary | ICD-10-CM | POA: Insufficient documentation

## 2017-08-22 MED ORDER — PREDNISONE 10 MG PO TABS: ORAL_TABLET | ORAL | 0 refills | Status: DC

## 2017-08-22 MED ORDER — CYANOCOBALAMIN 1000 MCG/ML IJ SOLN
1000.0000 ug | Freq: Once | INTRAMUSCULAR | Status: AC
Start: 2017-08-22 — End: 2017-08-22
  Administered 2017-08-22: 1000 ug via INTRAMUSCULAR

## 2017-08-22 MED ORDER — HYDROCODONE-HOMATROPINE 5-1.5 MG/5ML PO SYRP: 5.0000 mL | ORAL_SOLUTION | Freq: Four times a day (QID) | ORAL | 0 refills | Status: AC | PRN

## 2017-08-22 MED ORDER — LEVOFLOXACIN 250 MG PO TABS: 250.0000 mg | ORAL_TABLET | Freq: Every day | ORAL | 0 refills | Status: AC

## 2017-08-22 NOTE — Progress Notes (Signed)
Subjective:    Patient ID: Adrian Burke, male    DOB: [birth year], 77 y.o.   MRN: [id number]  HPI  Here with acute onset mild to mod 2-3 days ST, HA, general weakness and malaise, with prod cough greenish sputum, but Pt denies chest pain, increased sob or doe, wheezing, orthopnea, PND, increased LE swelling, palpitations, dizziness or syncope, except for onset mild sob and wheezing.  Due for B12 shot today.   Past Medical History:  Diagnosis Date  . Anemia   . Anxiety   . Arthritis   . B12 deficiency    on monthly injections  . Barrett's esophagus 03/2012  . Chest pain    2006 and 2011 ( neg nuclear stress test 2004) MI R/Oed  . Chest pain at rest 3/28-29/14  . Diverticulosis   . Elevated homocysteine ([place])   . Elevated PSA    due to prostatis  . GERD (gastroesophageal reflux disease)   . Headache(784.0)    spinal tap as OP   . History of kidney stones   . Hyperlipidemia    NMR panel 2005, LDL 125 (1309/534), HDL 38, TG 87. LDL Goal=<130. [place] study LDL Goald=<160.   . Prostate nodule 2000   resolved w/o treatement; Dr Jeffie Pollock  . Tubular adenoma of colon 06/2006   Dr Sharlett Iles   Past Surgical History:  Procedure Laterality Date  . ABDOMINAL HERNIA REPAIR  11/10/2010   mesh  . COLONOSCOPY W/ POLYPECTOMY  2001,2007,2013   F/U every 5 years; Dr Sharlett Iles  . KNEE ARTHROSCOPY Left 09/22/2010  . KNEE ARTHROSCOPY Right 01/02/2011  . PROSTATE BIOPSY  2000   Dr.Wrenn  . Rectal Fistula repair  07/2004  . REPLACEMENT TOTAL KNEE Right 02/10/2011  . ROTATOR CUFF REPAIR Right 2007   Dr.Jeff Beane  . UPPER GI ENDOSCOPY  2013   S/P dilation    reports that  has never smoked. he has never used smokeless tobacco. He reports that he does not drink alcohol or use drugs. family history includes Brain cancer in his maternal aunt; Crohn's disease in his other; Ovarian cancer in his mother. No Known Allergies Current Outpatient Medications on File Prior to Visit  Medication Sig Dispense  Refill  . acetaminophen (TYLENOL) 650 MG CR tablet Take 1,300 mg by mouth 2 (two) times daily as needed for pain.     [name] albuterol (PROVENTIL HFA;VENTOLIN HFA) 108 (90 Base) MCG/ACT inhaler Inhale 2 puffs into the lungs every 6 (six) hours as needed for wheezing or shortness of breath. 1 Inhaler 11  . aspirin 81 MG EC tablet Take 81 mg by mouth every evening.     . CEPHALEXIN PO Take by mouth. Takes according to Dr. Randel Pigg instructions- 2 pills 1 hour before procedure, 1 pill 6 hours after procedure, then 1 pill 6 hours after that    . Cholecalciferol (VITAMIN D3) 1000 UNITS CAPS Take 1,000 Units by mouth every morning.     . cyanocobalamin (,VITAMIN B-12,) 1000 MCG/ML injection INJECT 1 ML (1000 MCG TOTAL) INTO THE MUSCLE EVERY 30 (THIRTY) DAYS. KEEP SEPT APPT W/NEW PCP FOR FUTURE REFILLS. 30 mL 5  . Multiple Vitamin (MULTIVITAMIN) capsule Take 1 capsule by mouth 2 (two) times daily.     . NON FORMULARY Pain aid ESF: takes prn as needed for pain    . omeprazole (PRILOSEC) 40 MG capsule Take 1 capsule by mouth two times daily 180 capsule 1  . OVER THE COUNTER MEDICATION Take 1 tablet by mouth  daily. Memory tablet    . TUBERCULIN SYR 1CC/27GX1/2" (B-D TB SYRINGE 1CC/27GX1/2") 27G X 1/2" 1 ML MISC Use monthly to give B12 injections 12 each 0  . Vitamin Mixture (ESTER-C PO) Take by mouth daily.     Current Facility-Administered Medications on File Prior to Visit  Medication Dose Route Frequency Provider Last Rate Last Dose  . 0.9 %  sodium chloride infusion  500 mL Intravenous Continuous Ladene Artist, MD       Review of Systems  Constitutional: Negative for other unusual diaphoresis or sweats HENT: Negative for ear discharge or swelling Eyes: Negative for other worsening visual disturbances Respiratory: Negative for stridor or other swelling  Gastrointestinal: Negative for worsening distension or other blood Genitourinary: Negative for retention or other urinary change Musculoskeletal:  Negative for other MSK pain or swelling Skin: Negative for color change or other new lesions Neurological: Negative for worsening tremors and other numbness  Psychiatric/Behavioral: Negative for worsening agitation or other fatigue All other system neg per pt    Objective:   Physical Exam BP 120/84 (BP Location: Left Arm, Patient Position: Sitting, Cuff Size: Normal)   Pulse 81   Temp 98.3 F (36.8 C) (Oral)   Ht 5' 8.5" (1.74 m)   Wt 220 lb (99.8 kg)   SpO2 98%   BMI 32.96 kg/m  VS noted,  Constitutional: Pt appears in NAD HENT: Head: NCAT.  Right Ear: External ear normal.  Left Ear: External ear normal.  Eyes: . Pupils are equal, round, and reactive to light. Conjunctivae and EOM are normal Bilat tm's with mild erythema.  Max sinus areas non tender.  Pharynx with mild erythema, no exudate Nose: without d/c or deformity Neck: Neck supple. Gross normal ROM Cardiovascular: Normal rate and regular rhythm.   Pulmonary/Chest: Effort normal and breath sounds decreased without rales but with bilat wheezing.  Neurological: Pt is alert. At baseline orientation, motor grossly intact Skin: Skin is warm. No rashes, other new lesions, no LE edema Psychiatric: Pt behavior is normal without agitation  No other exam findings    Assessment & Plan:

## 2017-08-22 NOTE — Patient Instructions (Signed)
You had the B12 shot today  Please take all new medication as prescribed - the antibiotic, cough medicine as needed, and the prednisone  Please continue all other medications as before, and refills have been done if requested.  Please have the pharmacy call with any other refills you may need.  Please keep your appointments with your specialists as you may have planned  Please go to the XRAY Department in the Basement (go straight as you get off the elevator) for the x-ray testing  You will be contacted by phone if any changes need to be made immediately.  Otherwise, you will receive a letter about your results with an explanation, but please check with MyChart first.  Please remember to sign up for MyChart if you have not done so, as this will be important to you in the future with finding out test results, communicating by private email, and scheduling acute appointments online when needed.

## 2017-08-23 NOTE — Assessment & Plan Note (Signed)
Mild to mod, for predpac asd, cxr as above,,  to f/u any worsening symptoms or concerns

## 2017-08-23 NOTE — Assessment & Plan Note (Signed)
Mild to mod, c/w bronchitis vs pna, for cxr, for antibx course, cough med prn,  to f/u any worsening symptoms or concerns 

## 2017-08-23 NOTE — Assessment & Plan Note (Signed)
For b12 IM today as is due

## 2017-08-23 NOTE — Assessment & Plan Note (Signed)
stable overall by history and exam, recent data reviewed with pt, and pt to continue medical treatment as before,  to f/u any worsening symptoms or concerns Lab Results  Component Value Date   HGBA1C 5.9 12/06/2016

## 2017-11-14 ENCOUNTER — Other Ambulatory Visit: Payer: Self-pay | Admitting: Gastroenterology

## 2017-11-14 DIAGNOSIS — K2270 Barrett's esophagus without dysplasia: Secondary | ICD-10-CM

## 2017-12-11 ENCOUNTER — Encounter: Payer: Self-pay | Admitting: Internal Medicine

## 2017-12-11 ENCOUNTER — Ambulatory Visit (INDEPENDENT_AMBULATORY_CARE_PROVIDER_SITE_OTHER): Payer: 59 | Admitting: Internal Medicine

## 2017-12-11 ENCOUNTER — Other Ambulatory Visit: Payer: Self-pay | Admitting: Internal Medicine

## 2017-12-11 ENCOUNTER — Other Ambulatory Visit (INDEPENDENT_AMBULATORY_CARE_PROVIDER_SITE_OTHER): Payer: 59

## 2017-12-11 ENCOUNTER — Telehealth: Payer: Self-pay

## 2017-12-11 VITALS — BP 140/90 | HR 72 | Temp 97.8°F | Ht 68.5 in | Wt 220.0 lb

## 2017-12-11 DIAGNOSIS — Z0001 Encounter for general adult medical examination with abnormal findings: Secondary | ICD-10-CM

## 2017-12-11 DIAGNOSIS — K2270 Barrett's esophagus without dysplasia: Secondary | ICD-10-CM | POA: Diagnosis not present

## 2017-12-11 DIAGNOSIS — F411 Generalized anxiety disorder: Secondary | ICD-10-CM | POA: Diagnosis not present

## 2017-12-11 DIAGNOSIS — E785 Hyperlipidemia, unspecified: Secondary | ICD-10-CM

## 2017-12-11 DIAGNOSIS — R7303 Prediabetes: Secondary | ICD-10-CM | POA: Diagnosis not present

## 2017-12-11 DIAGNOSIS — E559 Vitamin D deficiency, unspecified: Secondary | ICD-10-CM | POA: Diagnosis not present

## 2017-12-11 DIAGNOSIS — E538 Deficiency of other specified B group vitamins: Secondary | ICD-10-CM

## 2017-12-11 LAB — VITAMIN B12: Vitamin B-12: 1389 pg/mL — ABNORMAL HIGH (ref 211–911)

## 2017-12-11 LAB — LIPID PANEL
Cholesterol: 226 mg/dL — ABNORMAL HIGH (ref 0–200)
HDL: 47.1 mg/dL (ref >39.00)
LDL CALC: 140 mg/dL — AB (ref 0–99)
NONHDL: 178.99
Total CHOL/HDL Ratio: 5
Triglycerides: 194 mg/dL — ABNORMAL HIGH (ref 0.0–149.0)
VLDL: 38.8 mg/dL (ref 0.0–40.0)

## 2017-12-11 LAB — CBC WITH DIFFERENTIAL/PLATELET
BASOS ABS: 0.1 10*3/uL (ref 0.0–0.1)
Basophils Relative: 1.3 % (ref 0.0–3.0)
EOS ABS: 0.3 10*3/uL (ref 0.0–0.7)
Eosinophils Relative: 5.3 % — ABNORMAL HIGH (ref 0.0–5.0)
HEMATOCRIT: 41.2 % (ref 39.0–52.0)
Hemoglobin: 14.1 g/dL (ref 13.0–17.0)
LYMPHS PCT: 26.5 % (ref 12.0–46.0)
Lymphs Abs: 1.4 10*3/uL (ref 0.7–4.0)
MCHC: 34.2 g/dL (ref 30.0–36.0)
MCV: 90.6 fl (ref 78.0–100.0)
MONOS PCT: 6.7 % (ref 3.0–12.0)
Monocytes Absolute: 0.4 10*3/uL (ref 0.1–1.0)
NEUTROS ABS: 3.2 10*3/uL (ref 1.4–7.7)
NEUTROS PCT: 60.2 % (ref 43.0–77.0)
PLATELETS: 223 10*3/uL (ref 150.0–400.0)
RBC: 4.54 Mil/uL (ref 4.22–5.81)
RDW: 13.3 % (ref 11.5–15.5)
WBC: 5.3 10*3/uL (ref 4.0–10.5)

## 2017-12-11 LAB — PSA: PSA: 0.67 ng/mL (ref 0.10–4.00)

## 2017-12-11 LAB — BASIC METABOLIC PANEL
BUN: 16 mg/dL (ref 6–23)
CALCIUM: 9.5 mg/dL (ref 8.4–10.5)
CHLORIDE: 104 meq/L (ref 96–112)
CO2: 28 meq/L (ref 19–32)
CREATININE: 0.87 mg/dL (ref 0.40–1.50)
GFR: 90.58 mL/min (ref >60.00)
Glucose, Bld: 99 mg/dL (ref 70–99)
Potassium: 3.9 mEq/L (ref 3.5–5.1)
Sodium: 141 mEq/L (ref 135–145)

## 2017-12-11 LAB — URINALYSIS, ROUTINE W REFLEX MICROSCOPIC
BILIRUBIN URINE: NEGATIVE
HGB URINE DIPSTICK: NEGATIVE
Ketones, ur: NEGATIVE
LEUKOCYTES UA: NEGATIVE
Nitrite: NEGATIVE
RBC / HPF: NONE SEEN (ref 0–?)
Specific Gravity, Urine: 1.02 (ref 1.000–1.030)
TOTAL PROTEIN, URINE-UPE24: NEGATIVE
UROBILINOGEN UA: 0.2 (ref 0.0–1.0)
Urine Glucose: NEGATIVE
WBC UA: NONE SEEN (ref 0–?)
pH: 7.5 (ref 5.0–8.0)

## 2017-12-11 LAB — HEPATIC FUNCTION PANEL
ALK PHOS: 65 U/L (ref 39–117)
ALT: 22 U/L (ref 0–53)
AST: 22 U/L (ref 0–37)
Albumin: 4.4 g/dL (ref 3.5–5.2)
BILIRUBIN DIRECT: 0.1 mg/dL (ref 0.0–0.3)
TOTAL PROTEIN: 7.2 g/dL (ref 6.0–8.3)
Total Bilirubin: 0.7 mg/dL (ref 0.2–1.2)

## 2017-12-11 LAB — TSH: TSH: 1.56 u[IU]/mL (ref 0.35–4.50)

## 2017-12-11 LAB — HEMOGLOBIN A1C: Hgb A1c MFr Bld: 5.6 % (ref 4.6–6.5)

## 2017-12-11 LAB — VITAMIN D 25 HYDROXY (VIT D DEFICIENCY, FRACTURES): VITD: 52.73 ng/mL (ref 30.00–100.00)

## 2017-12-11 MED ORDER — RANITIDINE HCL 150 MG PO TABS: 150.0000 mg | ORAL_TABLET | Freq: Two times a day (BID) | ORAL | 3 refills | Status: DC

## 2017-12-11 MED ORDER — ROSUVASTATIN CALCIUM 20 MG PO TABS: 20.0000 mg | ORAL_TABLET | Freq: Every day | ORAL | 3 refills | Status: DC

## 2017-12-11 NOTE — Telephone Encounter (Signed)
Pt viewed results via MyChart

## 2017-12-11 NOTE — Telephone Encounter (Signed)
-----   Message from Biagio Borg, MD sent at 12/11/2017 12:24 PM EDT ----- Left message on MyChart, pt to cont same tx except  The test results show that your current treatment is OK, except for the persisting LDL elevation.  Please follow a lower cholesterol diet, and start generic Crestor 20 mg per day.  I will send a new prescription, and you should hear from the office soon    Jora Galluzzo to please inform pt, I will do rx

## 2017-12-11 NOTE — Progress Notes (Signed)
Subjective:    Patient ID: Adrian Burke, male    DOB: 08-Sep-1940, 77 y.o.   MRN: [id number]  HPI  Here for wellness and f/u;  Overall doing ok;  Pt denies Chest pain, worsening SOB, DOE, wheezing, orthopnea, PND, worsening LE edema, palpitations, dizziness or syncope.  Pt denies neurological change such as new headache, facial or extremity weakness.  Pt denies polydipsia, polyuria, or low sugar symptoms. Pt states overall good compliance with treatment and medications, good tolerability, and has been trying to follow appropriate diet.  Pt denies worsening depressive symptoms, suicidal ideation or panic. No fever, night sweats, wt loss, loss of appetite, or other constitutional symptoms.  Pt states good ability with ADL's, has low fall risk, home safety reviewed and adequate, no other significant changes in hearing or vision, and only occasionally active with exercise.  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  Declines tetanus for now.  Never started the crestor last yr, wanted to work on diet.  Does also have mild worsening gerd despite PPI BID good compliance.  No other interval change and new complaint  Denies worsening depressive symptoms, suicidal ideation, or panic; has ongoing anxiety Past Medical History:  Diagnosis Date  . Anemia   . Anxiety   . Arthritis   . B12 deficiency    on monthly injections  . Barrett's esophagus 03/2012  . Chest pain    2006 and 2011 ( neg nuclear stress test 2004) MI R/Oed  . Chest pain at rest 3/28-29/14  . Diverticulosis   . Elevated homocysteine ([place])   . Elevated PSA    due to prostatis  . GERD (gastroesophageal reflux disease)   . Headache(784.0)    spinal tap as OP   . History of kidney stones   . Hyperlipidemia    NMR panel 2005, LDL 125 (1309/534), HDL 38, TG 87. LDL Goal=<130. [place] study LDL Goald=<160.   . Prostate nodule 2000   resolved w/o treatement; Dr Jeffie Pollock  . Tubular adenoma of colon  06/2006   Dr Sharlett Iles   Past Surgical History:  Procedure Laterality Date  . ABDOMINAL HERNIA REPAIR  11/10/2010   mesh  . COLONOSCOPY W/ POLYPECTOMY  2001,2007,2013   F/U every 5 years; Dr Sharlett Iles  . KNEE ARTHROSCOPY Left 09/22/2010  . KNEE ARTHROSCOPY Right 01/02/2011  . PROSTATE BIOPSY  2000   Dr.Wrenn  . Rectal Fistula repair  07/2004  . REPLACEMENT TOTAL KNEE Right 02/10/2011  . ROTATOR CUFF REPAIR Right 2007   Dr.Jeff Beane  . UPPER GI ENDOSCOPY  2013   S/P dilation    reports that he has never smoked. He has never used smokeless tobacco. He reports that he does not drink alcohol or use drugs. family history includes Brain cancer in his maternal aunt; Crohn's disease in his other; Ovarian cancer in his mother. No Known Allergies Current Outpatient Medications on File Prior to Visit  Medication Sig Dispense Refill  . acetaminophen (TYLENOL) 650 MG CR tablet Take 1,300 mg by mouth 2 (two) times daily as needed for pain.     [name] albuterol (PROVENTIL HFA;VENTOLIN HFA) 108 (90 Base) MCG/ACT inhaler Inhale 2 puffs into the lungs every 6 (six) hours as needed for wheezing or shortness of breath. 1 Inhaler 11  . aspirin 81 MG EC tablet Take 81 mg by mouth every evening.     . CEPHALEXIN PO Take by mouth. Takes according to Dr. Randel Pigg instructions- 2 pills 1 hour  before procedure, 1 pill 6 hours after procedure, then 1 pill 6 hours after that    . Cholecalciferol (VITAMIN D3) 1000 UNITS CAPS Take 1,000 Units by mouth every morning.     . cyanocobalamin (,VITAMIN B-12,) 1000 MCG/ML injection INJECT 1 ML (1000 MCG TOTAL) INTO THE MUSCLE EVERY 30 (THIRTY) DAYS. KEEP SEPT APPT W/NEW PCP FOR FUTURE REFILLS. 30 mL 5  . Multiple Vitamin (MULTIVITAMIN) capsule Take 1 capsule by mouth 2 (two) times daily.     . NON FORMULARY Pain aid ESF: takes prn as needed for pain    . omeprazole (PRILOSEC) 40 MG capsule TAKE 1 CAPSULE BY MOUTH TWO TIMES DAILY 180 capsule 0  . OVER THE COUNTER MEDICATION Take 1  tablet by mouth daily. Memory tablet    . TUBERCULIN SYR 1CC/27GX1/2" (B-D TB SYRINGE 1CC/27GX1/2") 27G X 1/2" 1 ML MISC Use monthly to give B12 injections 12 each 0  . Vitamin Mixture (ESTER-C PO) Take by mouth daily.     No current facility-administered medications on file prior to visit.    Review of Systems Constitutional: Negative for other unusual diaphoresis, sweats, appetite or weight changes HENT: Negative for other worsening hearing loss, ear pain, facial swelling, mouth sores or neck stiffness.   Eyes: Negative for other worsening pain, redness or other visual disturbance.  Respiratory: Negative for other stridor or swelling Cardiovascular: Negative for other palpitations or other chest pain  Gastrointestinal: Negative for worsening diarrhea or loose stools, blood in stool, distention or other pain Genitourinary: Negative for hematuria, flank pain or other change in urine volume.  Musculoskeletal: Negative for myalgias or other joint swelling.  Skin: Negative for other color change, or other wound or worsening drainage.  Neurological: Negative for other syncope or numbness. Hematological: Negative for other adenopathy or swelling Psychiatric/Behavioral: Negative for hallucinations, other worsening agitation, SI, self-injury, or new decreased concentration All other system neg per pt    Objective:   Physical Exam BP 140/90   Pulse 72   Temp 97.8 F (36.6 C) (Oral)   Ht 5' 8.5" (1.74 m)   Wt 220 lb (99.8 kg)   SpO2 96%   BMI 32.96 kg/m  VS noted,  Constitutional: Pt is oriented to person, place, and time. Appears well-developed and well-nourished, in no significant distress and comfortable Head: Normocephalic and atraumatic  Eyes: Conjunctivae and EOM are normal. Pupils are equal, round, and reactive to light Right Ear: External ear normal without discharge Left Ear: External ear normal without discharge Nose: Nose without discharge or deformity Mouth/Throat:  Oropharynx is without other ulcerations and moist  Neck: Normal range of motion. Neck supple. No JVD present. No tracheal deviation present or significant neck LA or mass Cardiovascular: Normal rate, regular rhythm, normal heart sounds and intact distal pulses.   Pulmonary/Chest: WOB normal and breath sounds without rales or wheezing  Abdominal: Soft. Bowel sounds are normal. NT. No HSM  Musculoskeletal: Normal range of motion. Exhibits no edema Lymphadenopathy: Has no other cervical adenopathy.  Neurological: Pt is alert and oriented to person, place, and time. Pt has normal reflexes. No cranial nerve deficit. Motor grossly intact, Gait intact Skin: Skin is warm and dry. No rash noted or new ulcerations Psychiatric:  Has normal mood and affect. Behavior is normal without agitation No other exam findings    Assessment & Plan:

## 2017-12-11 NOTE — Patient Instructions (Addendum)
Please take all new medication as prescribed - the zantac  Please continue all other medications as before, and refills have been done if requested.  Please have the pharmacy call with any other refills you may need.  Please continue your efforts at being more active, low cholesterol diet, and weight control.  You are otherwise up to date with prevention measures today.  You will be contacted regarding the referral for: Dr Fuller Plan  Please keep your appointments with your specialists as you may have planned  Please go to the LAB in the Basement (turn left off the elevator) for the tests to be done today  You will be contacted by phone if any changes need to be made immediately.  Otherwise, you will receive a letter about your results with an explanation, but please check with MyChart first.  Please remember to sign up for MyChart if you have not done so, as this will be important to you in the future with finding out test results, communicating by private email, and scheduling acute appointments online when needed.  Please return in 6 months, or sooner if needed

## 2017-12-16 NOTE — Assessment & Plan Note (Signed)
stable overall by history and exam, recent data reviewed with pt, and pt to continue medical treatment as before,  to f/u any worsening symptoms or concerns  Lab Results  Component Value Date   HGBA1C 5.6 12/11/2017   

## 2017-12-16 NOTE — Assessment & Plan Note (Signed)
Ok for zantac 150 bid addition, refer Dr Fuller Plan, may need f/u Dr Justin Mend

## 2017-12-16 NOTE — Assessment & Plan Note (Signed)

## 2017-12-16 NOTE — Assessment & Plan Note (Signed)
stable overall by history and exam, recent data reviewed with pt, and pt to continue medical treatment as before,  to f/u any worsening symptoms or concerns, for f/u LDL 

## 2017-12-16 NOTE — Assessment & Plan Note (Signed)
stable overall by history and exam, and pt to continue medical treatment as before,  to f/u any worsening symptoms or concerns 

## 2017-12-31 DIAGNOSIS — R21 Rash and other nonspecific skin eruption: Secondary | ICD-10-CM | POA: Insufficient documentation

## 2017-12-31 DIAGNOSIS — Z7982 Long term (current) use of aspirin: Secondary | ICD-10-CM | POA: Insufficient documentation

## 2018-01-01 ENCOUNTER — Encounter (HOSPITAL_COMMUNITY): Payer: Self-pay | Admitting: Emergency Medicine

## 2018-01-01 ENCOUNTER — Encounter: Payer: Self-pay | Admitting: Internal Medicine

## 2018-01-01 ENCOUNTER — Ambulatory Visit (INDEPENDENT_AMBULATORY_CARE_PROVIDER_SITE_OTHER): Payer: 59 | Admitting: Internal Medicine

## 2018-01-01 ENCOUNTER — Emergency Department (HOSPITAL_COMMUNITY)
Admission: EM | Admit: 2018-01-01 | Discharge: 2018-01-01 | Disposition: A | Discharge status: Home / Self Care | Payer: 59 | Attending: Emergency Medicine | Admitting: Emergency Medicine

## 2018-01-01 ENCOUNTER — Other Ambulatory Visit: Payer: Self-pay

## 2018-01-01 VITALS — BP 122/82 | HR 72 | Temp 97.8°F | Ht 68.0 in | Wt 219.0 lb

## 2018-01-01 DIAGNOSIS — T783XXA Angioneurotic edema, initial encounter: Secondary | ICD-10-CM | POA: Insufficient documentation

## 2018-01-01 DIAGNOSIS — T783XXD Angioneurotic edema, subsequent encounter: Secondary | ICD-10-CM | POA: Diagnosis not present

## 2018-01-01 DIAGNOSIS — R21 Rash and other nonspecific skin eruption: Secondary | ICD-10-CM | POA: Diagnosis not present

## 2018-01-01 DIAGNOSIS — S5012XA Contusion of left forearm, initial encounter: Secondary | ICD-10-CM | POA: Insufficient documentation

## 2018-01-01 MED ORDER — HYDROCORTISONE 1 % EX CREA: TOPICAL_CREAM | CUTANEOUS | 0 refills | Status: DC

## 2018-01-01 MED ORDER — METHYLPREDNISOLONE ACETATE 80 MG/ML IJ SUSP
80.0000 mg | Freq: Once | INTRAMUSCULAR | Status: AC
  Administered 2018-01-01: 80 mg via INTRAMUSCULAR

## 2018-01-01 MED ORDER — DOXYCYCLINE HYCLATE 100 MG PO CAPS: 100.0000 mg | ORAL_CAPSULE | Freq: Two times a day (BID) | ORAL | 0 refills | Status: DC

## 2018-01-01 NOTE — Progress Notes (Signed)
Subjective:    Patient ID: Adrian Burke, male    DOB: Jun 04, 1941, 77 y.o.   MRN: [id number]  HPI  Here to f/u ED about 12 hrs ago with marked rash and swelling and discomfort to both feet and toes after stood in shallow water in seaweed.  Reaction seemed immediate and mod to severe without fever, chills.  Tx with topical steroid and antibiotic, now with at least 10% improvement I judge based on photos in EMR.  Also c/o left post mid arm large bruising after pit bull jumped to play on his arm.  Also has a circular rash to left medial heel for several months without trauma, ulcer or worsening pain, swelling.  Might be slightly itchy. Past Medical History:  Diagnosis Date  . Anemia   . Anxiety   . Arthritis   . B12 deficiency    on monthly injections  . Barrett's esophagus 03/2012  . Chest pain    2006 and 2011 ( neg nuclear stress test 2004) MI R/Oed  . Chest pain at rest 3/28-29/14  . Diverticulosis   . Elevated homocysteine ([place])   . Elevated PSA    due to prostatis  . GERD (gastroesophageal reflux disease)   . Headache(784.0)    spinal tap as OP   . History of kidney stones   . Hyperlipidemia    NMR panel 2005, LDL 125 (1309/534), HDL 38, TG 87. LDL Goal=<130. [place] study LDL Goald=<160.   . Prostate nodule 2000   resolved w/o treatement; Dr Jeffie Pollock  . Tubular adenoma of colon 06/2006   Dr Sharlett Iles   Past Surgical History:  Procedure Laterality Date  . ABDOMINAL HERNIA REPAIR  11/10/2010   mesh  . COLONOSCOPY W/ POLYPECTOMY  2001,2007,2013   F/U every 5 years; Dr Sharlett Iles  . KNEE ARTHROSCOPY Left 09/22/2010  . KNEE ARTHROSCOPY Right 01/02/2011  . PROSTATE BIOPSY  2000   Dr.Wrenn  . Rectal Fistula repair  07/2004  . REPLACEMENT TOTAL KNEE Right 02/10/2011  . ROTATOR CUFF REPAIR Right 2007   Dr.Jeff Beane  . UPPER GI ENDOSCOPY  2013   S/P dilation    reports that he has never smoked. He has never used smokeless tobacco. He reports that he does not drink alcohol or  use drugs. family history includes Brain cancer in his maternal aunt; Crohn's disease in his other; Ovarian cancer in his mother. No Known Allergies Current Outpatient Medications on File Prior to Visit  Medication Sig Dispense Refill  . acetaminophen (TYLENOL) 650 MG CR tablet Take 1,300 mg by mouth 2 (two) times daily as needed for pain.     [name] albuterol (PROVENTIL HFA;VENTOLIN HFA) 108 (90 Base) MCG/ACT inhaler Inhale 2 puffs into the lungs every 6 (six) hours as needed for wheezing or shortness of breath. 1 Inhaler 11  . aspirin 81 MG EC tablet Take 81 mg by mouth every evening.     . CEPHALEXIN PO Take by mouth. Takes according to Dr. Randel Pigg instructions- 2 pills 1 hour before procedure, 1 pill 6 hours after procedure, then 1 pill 6 hours after that    . Cholecalciferol (VITAMIN D3) 1000 UNITS CAPS Take 1,000 Units by mouth every morning.     . cyanocobalamin (,VITAMIN B-12,) 1000 MCG/ML injection INJECT 1 ML (1000 MCG TOTAL) INTO THE MUSCLE EVERY 30 (THIRTY) DAYS. KEEP SEPT APPT W/NEW PCP FOR FUTURE REFILLS. 30 mL 5  . doxycycline (VIBRAMYCIN) 100 MG capsule Take 1 capsule (100 mg total) by mouth  2 (two) times daily. 20 capsule 0  . hydrocortisone cream 1 % Apply to affected area 2 times daily 15 g 0  . Multiple Vitamin (MULTIVITAMIN) capsule Take 1 capsule by mouth 2 (two) times daily.     . NON FORMULARY Pain aid ESF: takes prn as needed for pain    . omeprazole (PRILOSEC) 40 MG capsule TAKE 1 CAPSULE BY MOUTH TWO TIMES DAILY 180 capsule 0  . OVER THE COUNTER MEDICATION Take 1 tablet by mouth daily. Memory tablet    . ranitidine (ZANTAC) 150 MG tablet Take 1 tablet (150 mg total) by mouth 2 (two) times daily. 180 tablet 3  . rosuvastatin (CRESTOR) 20 MG tablet Take 1 tablet (20 mg total) by mouth daily. 90 tablet 3  . TUBERCULIN SYR 1CC/27GX1/2" (B-D TB SYRINGE 1CC/27GX1/2") 27G X 1/2" 1 ML MISC Use monthly to give B12 injections 12 each 0  . Vitamin Mixture (ESTER-C PO) Take by mouth  daily.     No current facility-administered medications on file prior to visit.    Review of Systems  Constitutional: Negative for other unusual diaphoresis or sweats HENT: Negative for ear discharge or swelling Eyes: Negative for other worsening visual disturbances Respiratory: Negative for stridor or other swelling  Gastrointestinal: Negative for worsening distension or other blood Genitourinary: Negative for retention or other urinary change Musculoskeletal: Negative for other MSK pain or swelling Skin: Negative for color change or other new lesions Neurological: Negative for worsening tremors and other numbness  Psychiatric/Behavioral: Negative for worsening agitation or other fatigue All other system neg per pt    Objective:   Physical Exam BP 122/82   Pulse 72   Temp 97.8 F (36.6 C) (Oral)   Ht 5\' 8"  (1.727 m)   Wt 219 lb (99.3 kg)   SpO2 97%   BMI 33.30 kg/m  VS noted, non toxic Constitutional: Pt appears in NAD HENT: Head: NCAT.  Right Ear: External ear normal.  Left Ear: External ear normal.  Eyes: . Pupils are equal, round, and reactive to light. Conjunctivae and EOM are normal Nose: without d/c or deformity Neck: Neck supple. Gross normal ROM Cardiovascular: Normal rate and regular rhythm.   Pulmonary/Chest: Effort normal and breath sounds without rales or wheezing.  Neurological: Pt is alert. At baseline orientation, motor grossly intact Skin: Skin is warm. no LE edema per se but has bilat dorsal feet and toes erythema and swelling improved without ulceration or red streaks; left arm 2 cm bruising, and left medial heel with an unusual 1/2 cm circular nonraised lesion mild erythem without swelling or tender Psychiatric: Pt behavior is normal without agitation  No other exam findings    Assessment & Plan:

## 2018-01-01 NOTE — Assessment & Plan Note (Signed)
Left medial heel, unusual lesion, ok to try topical steroid cream for this, but if not improved would consider derm consult

## 2018-01-01 NOTE — ED Provider Notes (Signed)
[facility] DEPT Provider Note   CSN: [id number] Arrival date & time: 12/31/17  2256     History   Chief Complaint Chief Complaint  Patient presents with  . Rash    HPI CHAIM GATLEY is a 77 y.o. male.  Patient presents with a 2-day history of bilateral feet pain, redness and swelling. L>R States symptoms started yesterday.  His appointment with his doctor in 12 hours but decided to come to the ED because he was having some pain traveling up his legs with intermittent feelings of pins-and-needles feeling he believes he was exposed to seaweed while swimming in the ocean in [place] on June 15.  He denies any other new exposures.  No new shoes or socks or laundry detergents.  No difficulty breathing or difficulty swallowing.  No rash anywhere else.  No one else with similar rash.  He has been using Neosporin without relief.  The history is provided by the patient.  Rash      Past Medical History:  Diagnosis Date  . Anemia   . Anxiety   . Arthritis   . B12 deficiency    on monthly injections  . Barrett's esophagus 03/2012  . Chest pain    2006 and 2011 ( neg nuclear stress test 2004) MI R/Oed  . Chest pain at rest 3/28-29/14  . Diverticulosis   . Elevated homocysteine ([place])   . Elevated PSA    due to prostatis  . GERD (gastroesophageal reflux disease)   . Headache(784.0)    spinal tap as OP   . History of kidney stones   . Hyperlipidemia    NMR panel 2005, LDL 125 (1309/534), HDL 38, TG 87. LDL Goal=<130. [place] study LDL Goald=<160.   . Prostate nodule 2000   resolved w/o treatement; Dr Jeffie Pollock  . Tubular adenoma of colon 06/2006   Dr Sharlett Iles    Patient Active Problem List   Diagnosis Date Noted  . Cough 08/22/2017  . Wheezing 08/22/2017  . Lower back pain 12/06/2016  . Viral illness 08/10/2016  . Skin lesion 04/12/2016  . Prediabetes 04/12/2016  . Allergic rhinitis 04/12/2016  . Dyspnea 02/17/2016  . Pulmonary  nodules/lesions, multiple 02/17/2016  . Encounter for well adult exam with abnormal findings 02/17/2016  . Right lumbar radiculopathy 04/07/2015  . Obesity (BMI 30-39.9) 01/11/2014  . Barrett's esophagus 10/21/2012  . Chest pain 10/11/2012  . Hyperlipidemia 06/22/2010  . GERD 06/22/2010  . CERVICAL RADICULOPATHY, LEFT 05/31/2009  . Anxiety state 01/10/2008  . ADENOMATOUS COLONIC POLYP 05/27/2007  . ANEMIA, PERNICIOUS, HX OF 05/27/2007    Past Surgical History:  Procedure Laterality Date  . ABDOMINAL HERNIA REPAIR  11/10/2010   mesh  . COLONOSCOPY W/ POLYPECTOMY  2001,2007,2013   F/U every 5 years; Dr Sharlett Iles  . KNEE ARTHROSCOPY Left 09/22/2010  . KNEE ARTHROSCOPY Right 01/02/2011  . PROSTATE BIOPSY  2000   Dr.Wrenn  . Rectal Fistula repair  07/2004  . REPLACEMENT TOTAL KNEE Right 02/10/2011  . ROTATOR CUFF REPAIR Right 2007   Dr.Jeff Beane  . UPPER GI ENDOSCOPY  2013   S/P dilation        Home Medications    Prior to Admission medications   Medication Sig Start Date End Date Taking? Authorizing Provider  acetaminophen (TYLENOL) 650 MG CR tablet Take 1,300 mg by mouth 2 (two) times daily as needed for pain.     [provider]  albuterol (PROVENTIL HFA;VENTOLIN HFA) 108 (90 Base) MCG/ACT inhaler  Inhale 2 puffs into the lungs every 6 (six) hours as needed for wheezing or shortness of breath. 02/17/16   Biagio Borg, MD  aspirin 81 MG EC tablet Take 81 mg by mouth every evening.     [provider]  CEPHALEXIN PO Take by mouth. Takes according to Dr. Randel Pigg instructions- 2 pills 1 hour before procedure, 1 pill 6 hours after procedure, then 1 pill 6 hours after that    [provider]  Cholecalciferol (VITAMIN D3) 1000 UNITS CAPS Take 1,000 Units by mouth every morning.     [provider]  cyanocobalamin (,VITAMIN B-12,) 1000 MCG/ML injection INJECT 1 ML (1000 MCG TOTAL) INTO THE MUSCLE EVERY 30 (THIRTY) DAYS. KEEP SEPT APPT W/NEW PCP FOR FUTURE  REFILLS. 02/20/17   Biagio Borg, MD  Multiple Vitamin (MULTIVITAMIN) capsule Take 1 capsule by mouth 2 (two) times daily.     [provider]  NON FORMULARY Pain aid ESF: takes prn as needed for pain    [provider]  omeprazole (PRILOSEC) 40 MG capsule TAKE 1 CAPSULE BY MOUTH TWO TIMES DAILY 11/15/17   Ladene Artist, MD  OVER THE COUNTER MEDICATION Take 1 tablet by mouth daily. Memory tablet    [provider]  ranitidine (ZANTAC) 150 MG tablet Take 1 tablet (150 mg total) by mouth 2 (two) times daily. 12/11/17   Biagio Borg, MD  rosuvastatin (CRESTOR) 20 MG tablet Take 1 tablet (20 mg total) by mouth daily. 12/11/17   Biagio Borg, MD  TUBERCULIN SYR 1CC/27GX1/2" (B-D TB SYRINGE 1CC/27GX1/2") 27G X 1/2" 1 ML MISC Use monthly to give B12 injections 11/27/16   Biagio Borg, MD  Vitamin Mixture (ESTER-C PO) Take by mouth daily.    [provider]    Family History Family History  Problem Relation Age of Onset  . Ovarian cancer Mother   . Brain cancer Maternal Aunt   . Crohn's disease Other        grandson  . Diabetes Neg Hx   . Stroke Neg Hx   . Heart disease Neg Hx   . Colon cancer Neg Hx   . Esophageal cancer Neg Hx   . Stomach cancer Neg Hx   . Pancreatic cancer Neg Hx   . Kidney disease Neg Hx   . Liver disease Neg Hx     Social History Social History   Tobacco Use  . Smoking status: Never Smoker  . Smokeless tobacco: Never Used  Substance Use Topics  . Alcohol use: No    Alcohol/week: 0.0 oz  . Drug use: No     Allergies   Patient has no known allergies.   Review of Systems Review of Systems  Constitutional: Negative for activity change, appetite change and fever.  HENT: Negative for congestion and rhinorrhea.   Respiratory: Negative for cough, chest tightness and shortness of breath.   Cardiovascular: Positive for leg swelling. Negative for chest pain.  Gastrointestinal: Negative for abdominal pain, nausea and vomiting.    Genitourinary: Negative for dysuria and hematuria.  Skin: Positive for rash.  Neurological: Negative for dizziness, weakness and headaches.   all other systems are negative except as noted in the HPI and PMH.     Physical Exam Updated Vital Signs BP 106/73 (BP Location: Left Arm)   Pulse 61   Temp 98 F (36.7 C) (Oral)   Resp 16   Ht 5\' 8"  (1.727 m)   Wt 99.8 kg (220  lb)   SpO2 95%   BMI 33.45 kg/m   Physical Exam  Constitutional: He is oriented to person, place, and time. He appears well-developed and well-nourished. No distress.  HENT:  Head: Normocephalic and atraumatic.  Mouth/Throat: Oropharynx is clear and moist. No oropharyngeal exudate.  Eyes: Pupils are equal, round, and reactive to light. Conjunctivae and EOM are normal.  Neck: Normal range of motion. Neck supple.  No meningismus.  Cardiovascular: Normal rate, regular rhythm, normal heart sounds and intact distal pulses.  No murmur heard. Pulmonary/Chest: Effort normal and breath sounds normal. No respiratory distress.  Abdominal: Soft. There is no tenderness. There is no rebound and no guarding.  Musculoskeletal: Normal range of motion. He exhibits edema. He exhibits no tenderness.  Neurological: He is alert and oriented to person, place, and time. No cranial nerve deficit. He exhibits normal muscle tone. Coordination normal.  No ataxia on finger to nose bilaterally. No pronator drift. 5/5 strength throughout. CN 2-12 intact.Equal grip strength. Sensation intact.   Skin: Skin is warm. Rash noted. There is erythema.  Erythematous fine papular rash involving dorsal feet bilaterally left greater than right. Intact DP and PT pulses.  Ankle flexion extension intact No calf asymmetry or tenderness  Psychiatric: He has a normal mood and affect. His behavior is normal.  Nursing note and vitals reviewed.        ED Treatments / Results  Labs (all labs ordered are listed, but only abnormal results are  displayed) Labs Reviewed - No data to display  EKG None  Radiology No results found.  Procedures Procedures (including critical care time)  Medications Ordered in ED Medications - No data to display   Initial Impression / Assessment and Plan / ED Course  I have reviewed the triage vital signs and the nursing notes.  Pertinent labs & imaging results that were available during my care of the patient were reviewed by me and considered in my medical decision making (see chart for details).    Patient with rash to bilateral feet from what he states with seaweed exposure.  No systemic symptoms.  No difficulty breathing or difficulty swallowing.  Will treat Supportively with topical steroids.  Patient also requesting antibiotics for possible early cellulitis.  He does have a knee replacement so we will oblige. Advise follow-up with his PCP as scheduled later this afternoon.  Return precautions discussed Final Clinical Impressions(s) / ED Diagnoses   Final diagnoses:  Rash    ED Discharge Orders    None       Omarie Parcell, Annie Main, MD 01/01/18 [phone]

## 2018-01-01 NOTE — Patient Instructions (Signed)
You had the steroid shot today  Please continue all other medications as before, including the antibiotic and topical steroid cream  Please have the pharmacy call with any other refills you may need.  Please keep your appointments with your specialists as you may have planned

## 2018-01-01 NOTE — Assessment & Plan Note (Signed)
Mild, d/w pt natural hx and etiology/pathogenesis, no specific tx needed,  to f/u any worsening symptoms or concerns

## 2018-01-01 NOTE — Assessment & Plan Note (Signed)
With some improvement initial seen, to cont topical steroid and antibx, for depomedrol IM 80 x 1,  to f/u any worsening symptoms or concerns

## 2018-01-01 NOTE — ED Triage Notes (Signed)
Pt reports having redness and itching that started ot anterior left foot that started while vacationing in [place]. Pt reports having foot irritated by seaweed that may have caused it.

## 2018-01-01 NOTE — Discharge Instructions (Addendum)
Use the cream and antibiotics as prescribed.  Follow-up with your doctor today.  Return to the ED if you develop difficulty breathing, chest pain or any other concerns.

## 2018-01-10 ENCOUNTER — Encounter: Payer: Self-pay | Admitting: Gastroenterology

## 2018-01-25 ENCOUNTER — Other Ambulatory Visit: Payer: Self-pay | Admitting: Gastroenterology

## 2018-01-25 DIAGNOSIS — K2270 Barrett's esophagus without dysplasia: Secondary | ICD-10-CM

## 2018-03-05 ENCOUNTER — Ambulatory Visit (AMBULATORY_SURGERY_CENTER): Payer: Self-pay

## 2018-03-05 ENCOUNTER — Telehealth: Payer: Self-pay

## 2018-03-05 ENCOUNTER — Other Ambulatory Visit: Payer: Self-pay

## 2018-03-05 VITALS — Ht 68.5 in | Wt 216.2 lb

## 2018-03-05 DIAGNOSIS — K2270 Barrett's esophagus without dysplasia: Secondary | ICD-10-CM

## 2018-03-05 MED ORDER — CYANOCOBALAMIN 1000 MCG/ML IJ SOLN: INTRAMUSCULAR | 5 refills | Status: DC

## 2018-03-05 NOTE — Telephone Encounter (Signed)
Refill request

## 2018-03-05 NOTE — Progress Notes (Signed)
No egg or soy allergy known to patient  No issues with past sedation with any surgeries  or procedures, no intubation problems  No diet pills per patient No home 02 use per patient  No blood thinners per patient  Pt denies issues with constipation  No A fib or A flutter  EMMI video sent to pt's e mail , pt declined    

## 2018-03-06 ENCOUNTER — Telehealth: Payer: Self-pay | Admitting: Internal Medicine

## 2018-03-06 NOTE — Telephone Encounter (Signed)
Copied from [place] [id number]. Topic: Quick Communication - Rx Refill/Question >> Mar 06, 2018  4:35 PM Reyne Dumas L wrote: Medication: CEPHALEXIN PO  Has the patient contacted their pharmacy?  No - states that he gets this only occasionally and he has an endoscopy coming up that he will need it for in two weeks. (Agent: If no, request that the patient contact the pharmacy for the refill.) (Agent: If yes, when and what did the pharmacy advise?)  Preferred Pharmacy (with phone number or street name): [place], [place] - [address] [phone] (Phone) [phone] (Fax)  Agent: Please be advised that RX refills may take up to 3 business days. We ask that you follow-up with your pharmacy.

## 2018-03-07 NOTE — Telephone Encounter (Signed)
Ok for pt to inquire at GI if this is needed, otherwise this is not usually done

## 2018-03-07 NOTE — Telephone Encounter (Signed)
Called pt no answer LMOM RTC.../lmb 

## 2018-03-08 NOTE — Telephone Encounter (Signed)
Again, please ask GI for this as I am not involved and not responsible for this.

## 2018-03-08 NOTE — Telephone Encounter (Signed)
Pt return call back he states his orthopedic MD (Dr. Marlou Sa) told him he will need to take the cephalexin before any type of procedure/surgery for preventative care. He states MD explain to him that if any supplies/instrument happen to be dirty w/bacteria it can go straight to his knee where he had an knee replacement...Adrian Burke

## 2018-03-08 NOTE — Telephone Encounter (Signed)
Called pt no answer can't leave msg due to vm being full. Will send CRM to [facility] to inform pt if he return call back.Marland KitchenJohny Burke

## 2018-03-10 ENCOUNTER — Encounter (HOSPITAL_COMMUNITY): Payer: Self-pay | Admitting: Emergency Medicine

## 2018-03-10 ENCOUNTER — Emergency Department (HOSPITAL_COMMUNITY)
Admission: EM | Admit: 2018-03-10 | Discharge: 2018-03-10 | Disposition: A | Discharge status: Home / Self Care | Payer: 59 | Attending: Emergency Medicine | Admitting: Emergency Medicine

## 2018-03-10 ENCOUNTER — Emergency Department (HOSPITAL_COMMUNITY): Payer: 59

## 2018-03-10 ENCOUNTER — Other Ambulatory Visit: Payer: Self-pay

## 2018-03-10 DIAGNOSIS — Z96651 Presence of right artificial knee joint: Secondary | ICD-10-CM | POA: Diagnosis not present

## 2018-03-10 DIAGNOSIS — F419 Anxiety disorder, unspecified: Secondary | ICD-10-CM | POA: Diagnosis not present

## 2018-03-10 DIAGNOSIS — R42 Dizziness and giddiness: Secondary | ICD-10-CM

## 2018-03-10 DIAGNOSIS — R1110 Vomiting, unspecified: Secondary | ICD-10-CM | POA: Diagnosis not present

## 2018-03-10 DIAGNOSIS — Z7982 Long term (current) use of aspirin: Secondary | ICD-10-CM | POA: Diagnosis not present

## 2018-03-10 DIAGNOSIS — Z79899 Other long term (current) drug therapy: Secondary | ICD-10-CM | POA: Insufficient documentation

## 2018-03-10 HISTORY — DX: Dizziness and giddiness: R42

## 2018-03-10 LAB — COMPREHENSIVE METABOLIC PANEL
ALT: 25 U/L (ref 0–44)
ANION GAP: 10 (ref 5–15)
AST: 31 U/L (ref 15–41)
Albumin: 4.3 g/dL (ref 3.5–5.0)
Alkaline Phosphatase: 64 U/L (ref 38–126)
BUN: 13 mg/dL (ref 8–23)
CALCIUM: 9.6 mg/dL (ref 8.9–10.3)
CO2: 27 mmol/L (ref 22–32)
CREATININE: 0.91 mg/dL (ref 0.61–1.24)
Chloride: 105 mmol/L (ref 98–111)
GFR calc Af Amer: >60 mL/min (ref >60)
Glucose, Bld: 102 mg/dL — ABNORMAL HIGH (ref 70–99)
Potassium: 4 mmol/L (ref 3.5–5.1)
SODIUM: 142 mmol/L (ref 135–145)
Total Bilirubin: 0.8 mg/dL (ref 0.3–1.2)
Total Protein: 7.4 g/dL (ref 6.5–8.1)

## 2018-03-10 LAB — CBC WITH DIFFERENTIAL/PLATELET
BASOS ABS: 0 10*3/uL (ref 0.0–0.1)
BASOS PCT: 1 %
EOS PCT: 5 %
Eosinophils Absolute: 0.3 10*3/uL (ref 0.0–0.7)
HEMATOCRIT: 44.6 % (ref 39.0–52.0)
Hemoglobin: 14.7 g/dL (ref 13.0–17.0)
Lymphocytes Relative: 21 %
Lymphs Abs: 1.2 10*3/uL (ref 0.7–4.0)
MCH: 30.8 pg (ref 26.0–34.0)
MCHC: 33 g/dL (ref 30.0–36.0)
MCV: 93.3 fL (ref 78.0–100.0)
MONOS PCT: 9 %
Monocytes Absolute: 0.5 10*3/uL (ref 0.1–1.0)
Neutro Abs: 3.6 10*3/uL (ref 1.7–7.7)
Neutrophils Relative %: 64 %
PLATELETS: 243 10*3/uL (ref 150–400)
RBC: 4.78 MIL/uL (ref 4.22–5.81)
RDW: 13.2 % (ref 11.5–15.5)
WBC: 5.5 10*3/uL (ref 4.0–10.5)

## 2018-03-10 LAB — I-STAT TROPONIN, ED: Troponin i, poc: 0.01 ng/mL (ref 0.00–0.08)

## 2018-03-10 LAB — CBG MONITORING, ED: GLUCOSE-CAPILLARY: 101 mg/dL — AB (ref 70–99)

## 2018-03-10 MED ORDER — MECLIZINE HCL 25 MG PO TABS: 25.0000 mg | ORAL_TABLET | Freq: Three times a day (TID) | ORAL | 0 refills | Status: DC | PRN

## 2018-03-10 MED ORDER — SODIUM CHLORIDE 0.9 % IV BOLUS
1000.0000 mL | Freq: Once | INTRAVENOUS | Status: AC
  Administered 2018-03-10: 1000 mL via INTRAVENOUS

## 2018-03-10 MED ORDER — MECLIZINE HCL 25 MG PO TABS
25.0000 mg | ORAL_TABLET | Freq: Once | ORAL | Status: AC
  Administered 2018-03-10: 25 mg via ORAL
  Filled 2018-03-10: qty 1

## 2018-03-10 NOTE — Discharge Instructions (Signed)
Take meclizine as needed for dizziness. Follow up with your primary care provider for further evaluation in the next 1-2 days. Return to ED for worsening symptoms, vision changes, head injuries or falls, numbness in arms or legs, severe chest pain or shortness of breath.

## 2018-03-10 NOTE — ED Triage Notes (Addendum)
Patient c/o headache since last night and N/V since this morning. Reports light sensitivity. States nausea worsens with movement. Hx migraines.

## 2018-03-10 NOTE — ED Provider Notes (Signed)
[facility] DEPT Provider Note   CSN: [id number] Arrival date & time: 03/10/18  1707     History   Chief Complaint Chief Complaint  Patient presents with  . Headache  . Nausea    HPI Adrian Burke is a 77 y.o. male with a past medical history of GERD, who presents to ED for evaluation of dizziness, nausea that began last night.  Upon waking up this morning, he had 5 episodes of nonbloody, nonbilious emesis and diplopia.  He reports generalized "stomachache."  He also reports "light headache" and chest discomfort that he states is 2/2 is GERD. Patient is more concerned about diplopia and dizziness rather than headache.  He denies history of similar symptoms in the past.  He was seen and evaluated at the urgent care and was sent here for further imaging and work-up.  He is unsure if the nausea and vomiting is related to the pizza bread that he ate last night.  He has taken 1 dose of Pepto-Bismol with only mild improvement in his symptoms.  Denies any head injury or loss of consciousness.  No sick contacts with similar symptoms.  Denies any blood thinner use, prior CVA, fever, neck pain, injuries, shortness of breath, URI symptoms, urinary symptoms.  HPI  Past Medical History:  Diagnosis Date  . Anemia   . Anxiety   . Arthritis   . B12 deficiency    on monthly injections  . Barrett's esophagus 03/2012  . Cataract   . Chest pain    2006 and 2011 ( neg nuclear stress test 2004) MI R/Oed  . Chest pain at rest 3/28-29/14  . Diverticulosis   . Elevated homocysteine ([place])   . Elevated PSA    due to prostatis  . GERD (gastroesophageal reflux disease)   . Headache(784.0)    spinal tap as OP   . History of kidney stones   . Hyperlipidemia    NMR panel 2005, LDL 125 (1309/534), HDL 38, TG 87. LDL Goal=<130. [place] study LDL Goald=<160.   . Prostate nodule 2000   resolved w/o treatement; Dr Jeffie Pollock  . Tubular adenoma of colon 06/2006   Dr Sharlett Iles     Patient Active Problem List   Diagnosis Date Noted  . Rash 01/01/2018  . Contusion of left lower arm 01/01/2018  . Angioedema 01/01/2018  . Cough 08/22/2017  . Wheezing 08/22/2017  . Lower back pain 12/06/2016  . Viral illness 08/10/2016  . Skin lesion 04/12/2016  . Prediabetes 04/12/2016  . Allergic rhinitis 04/12/2016  . Dyspnea 02/17/2016  . Pulmonary nodules/lesions, multiple 02/17/2016  . Encounter for well adult exam with abnormal findings 02/17/2016  . Right lumbar radiculopathy 04/07/2015  . Obesity (BMI 30-39.9) 01/11/2014  . Barrett's esophagus 10/21/2012  . Chest pain 10/11/2012  . Hyperlipidemia 06/22/2010  . GERD 06/22/2010  . CERVICAL RADICULOPATHY, LEFT 05/31/2009  . Anxiety state 01/10/2008  . ADENOMATOUS COLONIC POLYP 05/27/2007  . ANEMIA, PERNICIOUS, HX OF 05/27/2007    Past Surgical History:  Procedure Laterality Date  . ABDOMINAL HERNIA REPAIR  11/10/2010   mesh  . CATARACT EXTRACTION, BILATERAL    . COLONOSCOPY W/ POLYPECTOMY  2001,2007,2013   F/U every 5 years; Dr Sharlett Iles  . KNEE ARTHROSCOPY Left 09/22/2010  . KNEE ARTHROSCOPY Right 01/02/2011  . PROSTATE BIOPSY  2000   Dr.Wrenn  . Rectal Fistula repair  07/2004  . REPLACEMENT TOTAL KNEE Right 02/10/2011  . ROTATOR CUFF REPAIR Right 2007   Dr.Jeff Beane  .  UPPER GI ENDOSCOPY  2013   S/P dilation        Home Medications    Prior to Admission medications   Medication Sig Start Date End Date Taking? Authorizing Provider  acetaminophen (TYLENOL) 650 MG CR tablet Take 1,300 mg by mouth 2 (two) times daily as needed for pain.     [provider]  albuterol (PROVENTIL HFA;VENTOLIN HFA) 108 (90 Base) MCG/ACT inhaler Inhale 2 puffs into the lungs every 6 (six) hours as needed for wheezing or shortness of breath. Patient not taking: Reported on 03/05/2018 02/17/16   Biagio Borg, MD  aspirin 81 MG EC tablet Take 81 mg by mouth every evening.     [provider]  CEPHALEXIN PO Take  by mouth. Takes according to Dr. Randel Pigg instructions- 2 pills 1 hour before procedure, 1 pill 6 hours after procedure, then 1 pill 6 hours after that    [provider]  Cholecalciferol (VITAMIN D3) 1000 UNITS CAPS Take 1,000 Units by mouth every morning.     [provider]  cyanocobalamin (,VITAMIN B-12,) 1000 MCG/ML injection INJECT 1 ML (1000 MCG TOTAL) INTO THE MUSCLE EVERY 30 (THIRTY) DAYS. KEEP SEPT APPT W/NEW PCP FOR FUTURE REFILLS. 03/05/18   Biagio Borg, MD  hydrocortisone cream 1 % Apply to affected area 2 times daily 01/01/18   Rancour, Annie Main, MD  meclizine (ANTIVERT) 25 MG tablet Take 1 tablet (25 mg total) by mouth 3 (three) times daily as needed for dizziness. 03/10/18   Dempsey Knotek, PA-C  Multiple Vitamin (MULTIVITAMIN) capsule Take 1 capsule by mouth 2 (two) times daily.     [provider]  NON FORMULARY Pain aid ESF: takes prn as needed for pain    [provider]  omeprazole (PRILOSEC) 40 MG capsule TAKE 1 CAPSULE BY MOUTH TWO TIMES DAILY 01/28/18   Ladene Artist, MD  OVER THE COUNTER MEDICATION Take 1 tablet by mouth daily. Memory tablet    [provider]  TUBERCULIN SYR 1CC/27GX1/2" (B-D TB SYRINGE 1CC/27GX1/2") 27G X 1/2" 1 ML MISC Use monthly to give B12 injections 11/27/16   Biagio Borg, MD  Vitamin Mixture (ESTER-C PO) Take by mouth daily.    [provider]    Family History Family History  Problem Relation Age of Onset  . Ovarian cancer Mother   . Brain cancer Maternal Aunt   . Crohn's disease Other        grandson  . Diabetes Neg Hx   . Stroke Neg Hx   . Heart disease Neg Hx   . Colon cancer Neg Hx   . Esophageal cancer Neg Hx   . Stomach cancer Neg Hx   . Pancreatic cancer Neg Hx   . Kidney disease Neg Hx   . Liver disease Neg Hx   . Rectal cancer Neg Hx     Social History Social History   Tobacco Use  . Smoking status: Never Smoker  . Smokeless tobacco: Never Used  Substance Use Topics  .  Alcohol use: No    Alcohol/week: 0.0 standard drinks  . Drug use: No     Allergies   Patient has no known allergies.   Review of Systems Review of Systems  Constitutional: Negative for appetite change, chills and fever.  HENT: Negative for ear pain, rhinorrhea, sneezing and sore throat.   Eyes: Positive for visual disturbance. Negative for photophobia.  Respiratory: Negative for cough, chest tightness, shortness of breath and wheezing.  Cardiovascular: Positive for chest pain. Negative for palpitations.  Gastrointestinal: Positive for abdominal pain, diarrhea and vomiting. Negative for blood in stool, constipation and nausea.  Genitourinary: Negative for dysuria, hematuria and urgency.  Musculoskeletal: Negative for myalgias.  Skin: Negative for rash.  Neurological: Positive for dizziness. Negative for weakness and light-headedness.     Physical Exam Updated Vital Signs BP (!) 155/88 (BP Location: Left Arm)   Pulse 61   Temp 97.9 F (36.6 C) (Oral)   Resp 20   Ht 5' 8.5" (1.74 m)   Wt 98 kg   SpO2 96%   BMI 32.37 kg/m   Physical Exam  Constitutional: He is oriented to person, place, and time. He appears well-developed and well-nourished. No distress.  HENT:  Head: Normocephalic and atraumatic.  Nose: Nose normal.  Eyes: Pupils are equal, round, and reactive to light. Conjunctivae and EOM are normal. Right eye exhibits no discharge. Left eye exhibits no discharge. No scleral icterus.  No nystagmus noted.  Neck: Normal range of motion. Neck supple.  No meningismus.  Cardiovascular: Normal rate, regular rhythm, normal heart sounds and intact distal pulses. Exam reveals no gallop and no friction rub.  No murmur heard. Pulmonary/Chest: Effort normal and breath sounds normal. No respiratory distress.  Abdominal: Soft. Bowel sounds are normal. He exhibits no distension. There is no tenderness. There is no guarding.  Musculoskeletal: Normal range of motion. He exhibits no  edema.  Neurological: He is alert and oriented to person, place, and time. No cranial nerve deficit or sensory deficit. He exhibits normal muscle tone. Coordination normal.  Pupils reactive. No facial asymmetry noted. Cranial nerves appear grossly intact. Sensation intact to light touch on face, BUE and BLE. Strength 5/5 in BUE and BLE.  Skin: Skin is warm and dry. No rash noted.  Psychiatric: He has a normal mood and affect.  Nursing note and vitals reviewed.    ED Treatments / Results  Labs (all labs ordered are listed, but only abnormal results are displayed) Labs Reviewed  COMPREHENSIVE METABOLIC PANEL - Abnormal; Notable for the following components:      Result Value   Glucose, Bld 102 (*)    All other components within normal limits  CBG MONITORING, ED - Abnormal; Notable for the following components:   Glucose-Capillary 101 (*)    All other components within normal limits  CBC WITH DIFFERENTIAL/PLATELET  I-STAT TROPONIN, ED    EKG None  Radiology Mr Brain Wo Contrast  Result Date: 03/10/2018 CLINICAL DATA:  Headache beginning last night. Nausea and vomiting today. Light sensitivity. Nausea and vomiting is worse with movement. EXAM: MRI HEAD WITHOUT CONTRAST TECHNIQUE: Multiplanar, multiecho pulse sequences of the brain and surrounding structures were obtained without intravenous contrast. COMPARISON:  None FINDINGS: Brain: No acute infarct, hemorrhage, or mass lesion is present. The ventricles are of normal size. Mild atrophy and white matter changes are present, likely within normal limits for age. No significant extraaxial fluid collection is present. Vascular: Flow is present in the major intracranial arteries. Skull and upper cervical spine: The skull base is within normal limits. Craniocervical junction is normal. Degenerative changes are present cervical spine central canal narrowing at C3-4. Marrow signal is normal. Sinuses/Orbits: Mild mucosal thickening is present in  the anterior ethmoid air cells and frontal sinuses bilaterally. No fluid levels are present. The paranasal sinuses and mastoid air cells are otherwise clear. Bilateral lens replacements are present. Globes and orbits are otherwise within limits. IMPRESSION: 1. Normal MRI appearance the  brain for age no acute or focal lesion to explain headaches or vomiting. 2. Minimal anterior paranasal sinus disease. Electronically Signed   By: San Morelle M.D.   On: 03/10/2018 19:41    Procedures Procedures (including critical care time)  Medications Ordered in ED Medications  sodium chloride 0.9 % bolus 1,000 mL (1,000 mLs Intravenous New Bag/Given 03/10/18 1811)  meclizine (ANTIVERT) tablet 25 mg (25 mg Oral Given 03/10/18 1810)     Initial Impression / Assessment and Plan / ED Course  I have reviewed the triage vital signs and the nursing notes.  Pertinent labs & imaging results that were available during my care of the patient were reviewed by me and considered in my medical decision making (see chart for details).     77 year old male with a past medical history of GERD, presents to ED for evaluation of dizziness, nausea that began last night.  Upon waking up this morning, he had 5 episodes of nonbloody, nonbilious emesis and diplopia.  He reports generalized abdominal pain, light headache and chest discomfort that he states is due to his GERD.  He is unsure if the nausea and vomiting is related to the meal that he ate last night.  He reports mild improvement in the abdominal discomfort with Pepto-Bismol.  Denies any injuries or falls, blood thinner use, prior CVA, numbness in arms or legs, weakness in arms or legs or changes in gait.  On physical exam patient is overall well-appearing.  Abdomen soft, ntnd.  No deficits on neurological exam noted.  Patient does report diplopa. PERRL.  EKG shows normal sinus rhythm.  CBC, CMP, troponin unremarkable.  MRI of the brain shows no acute findings to  explain the symptoms.  Patient was given 1 L fluid bolus and meclizine with complete resolution of his symptoms.  Denies any ongoing diplopia.  Reports improvement in the headache and dizziness.  He was able to tolerate po without difficulty prior to discharge.  Will advise him to follow-up with PCP for further evaluation.  Will give as needed meclizine for nausea.  Advised to return to ED for any severe worsening symptoms. Patient discussed with and seen by my attending, Dr. Wilson Singer.  Portions of this note were generated with [employer]. Dictation errors may occur despite best attempts at proofreading.   Final Clinical Impressions(s) / ED Diagnoses   Final diagnoses:  Vertigo    ED Discharge Orders         Ordered    meclizine (ANTIVERT) 25 MG tablet  3 times daily PRN     03/10/18 1959           Delia Heady, PA-C 03/10/18 Guy Franco, MD 03/16/18 [phone]

## 2018-03-19 ENCOUNTER — Ambulatory Visit (AMBULATORY_SURGERY_CENTER): Payer: 59 | Admitting: Gastroenterology

## 2018-03-19 ENCOUNTER — Encounter: Payer: Self-pay | Admitting: Gastroenterology

## 2018-03-19 VITALS — BP 99/66 | HR 55 | Temp 98.4°F | Resp 14 | Ht 68.5 in | Wt 216.0 lb

## 2018-03-19 DIAGNOSIS — K449 Diaphragmatic hernia without obstruction or gangrene: Secondary | ICD-10-CM

## 2018-03-19 DIAGNOSIS — K2270 Barrett's esophagus without dysplasia: Secondary | ICD-10-CM | POA: Diagnosis not present

## 2018-03-19 DIAGNOSIS — K219 Gastro-esophageal reflux disease without esophagitis: Secondary | ICD-10-CM | POA: Diagnosis not present

## 2018-03-19 MED ORDER — SODIUM CHLORIDE 0.9 % IV SOLN: 500.0000 mL | Freq: Once | INTRAVENOUS | Status: DC

## 2018-03-19 NOTE — Progress Notes (Signed)
Called to room to assist during endoscopic procedure.  Patient ID and intended procedure confirmed with present staff. Received instructions for my participation in the procedure from the performing physician.  

## 2018-03-19 NOTE — Progress Notes (Signed)
Report given to PACU, vss 

## 2018-03-19 NOTE — Progress Notes (Signed)
Pt's states no medical or surgical changes since previsit or office visit. 

## 2018-03-19 NOTE — Op Note (Signed)
[facility] Patient Name: Adrian Burke Procedure Date: 03/19/2018 9:28 AM MRN: [id number] Endoscopist: Ladene Artist , MD Age: 77 Referring MD:  Date of Birth: 1940/12/25 Gender: Male Account #: [id number] Procedure:                Upper GI endoscopy Indications:              Surveillance for malignancy due to personal history                            of Barrett's esophagus Medicines:                Monitored Anesthesia Care Procedure:                Pre-Anesthesia Assessment:                           - Prior to the procedure, a History and Physical                            was performed, and patient medications and                            allergies were reviewed. The patient's tolerance of                            previous anesthesia was also reviewed. The risks                            and benefits of the procedure and the sedation                            options and risks were discussed with the patient.                            All questions were answered, and informed consent                            was obtained. Prior Anticoagulants: The patient has                            taken no previous anticoagulant or antiplatelet                            agents. ASA Grade Assessment: II - A patient with                            mild systemic disease. After reviewing the risks                            and benefits, the patient was deemed in                            satisfactory condition to undergo the procedure.  After obtaining informed consent, the endoscope was                            passed under direct vision. Throughout the                            procedure, the patient's blood pressure, pulse, and                            oxygen saturations were monitored continuously. The                            Endoscope was introduced through the mouth, and                            advanced to the second part of  duodenum. The upper                            GI endoscopy was accomplished without difficulty.                            The patient tolerated the procedure well. Scope In: Scope Out: Findings:                 There were esophageal mucosal changes suspicious                            for short-segment Barrett's esophagus present in                            the distal esophagus. The maximum longitudinal                            extent of these mucosal changes was 1 cm in length.                            Mucosa was biopsied with a cold forceps for                            histology. One specimen bottle was sent to                            pathology.                           The exam of the esophagus was otherwise normal.                           A small hiatal hernia was present.                           The exam of the stomach was otherwise normal.                           The duodenal bulb  and second portion of the                            duodenum were normal. Complications:            No immediate complications. Estimated Blood Loss:     Estimated blood loss was minimal. Impression:               - Esophageal mucosal changes suspicious for                            short-segment Barrett's esophagus. Biopsied.                           - Small hiatal hernia.                           - Normal duodenal bulb and second portion of the                            duodenum. Recommendation:           - Patient has a contact number available for                            emergencies. The signs and symptoms of potential                            delayed complications were discussed with the                            patient. Return to normal activities tomorrow.                            Written discharge instructions were provided to the                            patient.                           - Resume previous diet.                           - Continue present  medications.                           - Await pathology results.                           - Repeat upper endoscopy in 3 years for                            surveillance if no dysplasia. Ladene Artist, MD 03/19/2018 9:50:29 AM This report has been signed electronically.

## 2018-03-19 NOTE — Patient Instructions (Signed)
YOU HAD AN ENDOSCOPIC PROCEDURE TODAY AT THE [facility]:   Refer to the procedure report that was given to you for any specific questions about what was found during the examination.  If the procedure report does not answer your questions, please call your gastroenterologist to clarify.  If you requested that your care partner not be given the details of your procedure findings, then the procedure report has been included in a sealed envelope for you to review at your convenience later.  YOU SHOULD EXPECT: Some feelings of bloating in the abdomen. Passage of more gas than usual.  Walking can help get rid of the air that was put into your GI tract during the procedure and reduce the bloating.   Please Note:  You might notice some irritation and congestion in your nose or some drainage.  This is from the oxygen used during your procedure.  There is no need for concern and it should clear up in a day or so.  SYMPTOMS TO REPORT IMMEDIATELY:    Following upper endoscopy (EGD)  Vomiting of blood or coffee ground material  New chest pain or pain under the shoulder blades  Painful or persistently difficult swallowing  New shortness of breath  Fever of 100F or higher  Black, tarry-looking stools  For urgent or emergent issues, a gastroenterologist can be reached at any hour by calling [phone].   DIET:  We do recommend a small meal at first, but then you may proceed to your regular diet.  Drink plenty of fluids but you should avoid alcoholic beverages for 24 hours.  ACTIVITY:  You should plan to take it easy for the rest of today and you should NOT DRIVE or use heavy machinery until tomorrow (because of the sedation medicines used during the test).    FOLLOW UP: Our staff will call the number listed on your records the next business day following your procedure to check on you and address any questions or concerns that you may have regarding the information given to you  following your procedure. If we do not reach you, we will leave a message.  However, if you are feeling well and you are not experiencing any problems, there is no need to return our call.  We will assume that you have returned to your regular daily activities without incident.  If any biopsies were taken you will be contacted by phone or by letter within the next 1-3 weeks.  Please call us at [phone] if you have not heard about the biopsies in 3 weeks.    SIGNATURES/CONFIDENTIALITY: You and/or your care partner have signed paperwork which will be entered into your electronic medical record.  These signatures attest to the fact that that the information above on your After Visit Summary has been reviewed and is understood.  Full responsibility of the confidentiality of this discharge information lies with you and/or your care-partner.  Read all handouts given to you by your recovery room nurse. 

## 2018-03-20 ENCOUNTER — Telehealth: Payer: Self-pay

## 2018-03-20 NOTE — Telephone Encounter (Signed)
  Follow up Call-  Call back number 03/19/2018 02/07/2017  Post procedure Call Back phone  # [phone]  Permission to leave phone message Yes Yes  Some recent data might be hidden     Patient questions:  Do you have a fever, pain , or abdominal swelling? No. Pain Score  0 *  Have you tolerated food without any problems? Yes.    Have you been able to return to your normal activities? Yes.    Do you have any questions about your discharge instructions: Diet   No. Medications  No. Follow up visit  No.  Do you have questions or concerns about your Care? No.  Actions: * If pain score is 4 or above: No action needed, pain <4.

## 2018-03-24 ENCOUNTER — Other Ambulatory Visit: Payer: Self-pay | Admitting: Gastroenterology

## 2018-03-24 DIAGNOSIS — K2270 Barrett's esophagus without dysplasia: Secondary | ICD-10-CM

## 2018-03-27 ENCOUNTER — Encounter: Payer: Self-pay | Admitting: Gastroenterology

## 2018-04-09 ENCOUNTER — Other Ambulatory Visit: Payer: Self-pay | Admitting: Gastroenterology

## 2018-04-09 DIAGNOSIS — K2270 Barrett's esophagus without dysplasia: Secondary | ICD-10-CM

## 2018-06-04 ENCOUNTER — Other Ambulatory Visit: Payer: Self-pay | Admitting: Gastroenterology

## 2018-06-04 DIAGNOSIS — K2270 Barrett's esophagus without dysplasia: Secondary | ICD-10-CM

## 2018-07-26 ENCOUNTER — Other Ambulatory Visit: Payer: Self-pay | Admitting: Internal Medicine

## 2018-07-26 MED ORDER — "TUBERCULIN SYRINGE 27G X 1/2"" 1 ML MISC": 0 refills | Status: DC

## 2018-07-26 NOTE — Telephone Encounter (Signed)
Requested Prescriptions  Pending Prescriptions Disp Refills  . TUBERCULIN SYR 1CC/27GX1/2" (B-D TB SYRINGE 1CC/27GX1/2") 27G X 1/2" 1 ML MISC 12 each 0    Sig: Use monthly to give B12 injections     There is no refill protocol information for this order

## 2018-07-26 NOTE — Telephone Encounter (Signed)
Copied from [place] [id number]. Topic: Quick Communication - Rx Refill/Question >> Jul 26, 2018  1:20 PM Blase Mess A wrote: Medication: TUBERCULIN SYR 1CC/27GX1/2" (B-D TB SYRINGE 1CC/27GX1/2") 27G X 1/2" 1 ML MISC [[id number]]   Has the patient contacted their pharmacy? Yes  (Agent: If no, request that the patient contact the pharmacy for the refill.) (Agent: If yes, when and what did the pharmacy advise?)  Preferred Pharmacy (with phone number or street name): [place], [place] - [address] [phone] (Phone) [phone] (Fax)    Agent: Please be advised that RX refills may take up to 3 business days. We ask that you follow-up with your pharmacy.

## 2018-08-28 ENCOUNTER — Other Ambulatory Visit: Payer: Self-pay | Admitting: Gastroenterology

## 2018-08-28 DIAGNOSIS — K2270 Barrett's esophagus without dysplasia: Secondary | ICD-10-CM

## 2018-11-22 ENCOUNTER — Other Ambulatory Visit: Payer: Self-pay | Admitting: Gastroenterology

## 2018-11-22 DIAGNOSIS — K2270 Barrett's esophagus without dysplasia: Secondary | ICD-10-CM

## 2019-01-27 ENCOUNTER — Encounter: Payer: Self-pay | Admitting: Internal Medicine

## 2019-01-28 ENCOUNTER — Encounter: Payer: Self-pay | Admitting: Internal Medicine

## 2019-01-28 ENCOUNTER — Ambulatory Visit: Payer: 59 | Admitting: Internal Medicine

## 2019-01-28 ENCOUNTER — Ambulatory Visit (INDEPENDENT_AMBULATORY_CARE_PROVIDER_SITE_OTHER): Payer: 59 | Admitting: Internal Medicine

## 2019-01-28 DIAGNOSIS — K921 Melena: Secondary | ICD-10-CM | POA: Diagnosis not present

## 2019-01-28 DIAGNOSIS — R7303 Prediabetes: Secondary | ICD-10-CM | POA: Diagnosis not present

## 2019-01-28 DIAGNOSIS — R1084 Generalized abdominal pain: Secondary | ICD-10-CM | POA: Diagnosis not present

## 2019-01-28 DIAGNOSIS — R109 Unspecified abdominal pain: Secondary | ICD-10-CM | POA: Insufficient documentation

## 2019-01-28 NOTE — Assessment & Plan Note (Signed)
stable overall by history and exam, recent data reviewed with pt, and pt to continue medical treatment as before,  to f/u any worsening symptoms or concerns  

## 2019-01-28 NOTE — Assessment & Plan Note (Addendum)
Suspect diverticular bleeding intermittent, ideally needs labs asap but unable due to pending covid testing; will order labs to be done as as covid proved negative, pt agrees, and will go to ED for bleeding > 15-30 min

## 2019-01-28 NOTE — Assessment & Plan Note (Signed)
Suspect crampy pain related to BRBPR but unlikely infectious; for labs as ordered AFTER Covid testing results return neg

## 2019-01-28 NOTE — Progress Notes (Signed)
Patient ID: Adrian Burke, male   DOB: Jul 15, 1941, 78 y.o.   MRN: [id number]  Virtual Visit via Video Note  I connected with Adrian Burke on 01/28/19 at  4:00 PM EDT by a video enabled telemedicine application and verified that I am speaking with the correct person using two identifiers.  Location: Patient: at home Provider: at office   I discussed the limitations of evaluation and management by telemedicine and the availability of in person appointments. The patient expressed understanding and agreed to proceed.  History of Present Illness: Here to f/u with c/o abd pain and BRBPR x 3 episodes in last 3 days; no fever, Denies worsening reflux, dysphagia, n/v, or constipation/diarrhea.  Seen at [facility] yesterday, and offered and accepted COVID testing, results pending 3-5 days per pt.  Pt states no other specific tx or lab evaluation.  Pt denies chest pain, increased sob or doe, wheezing, orthopnea, PND, increased LE swelling, palpitations, dizziness or syncope.   Pt denies polydipsia, polyuria  Last colonoscopy 2018 with internal hemorrhoid and diverticulosis Past Medical History:  Diagnosis Date  . Anemia   . Anxiety   . Arthritis   . B12 deficiency    on monthly injections  . Barrett's esophagus 03/2012  . Cataract   . Chest pain    2006 and 2011 ( neg nuclear stress test 2004) MI R/Oed  . Chest pain at rest 3/28-29/14  . Diverticulosis   . Elevated homocysteine ([place])   . Elevated PSA    due to prostatis  . GERD (gastroesophageal reflux disease)   . Headache(784.0)    spinal tap as OP   . History of kidney stones   . Hyperlipidemia    NMR panel 2005, LDL 125 (1309/534), HDL 38, TG 87. LDL Goal=<130. [place] study LDL Goald=<160.   . Prostate nodule 2000   resolved w/o treatement; Dr Jeffie Pollock  . Tubular adenoma of colon 06/2006   Dr Sharlett Iles  . Vertigo 03/10/2018   Patient went to ED for vertigo   Past Surgical History:  Procedure Laterality Date  . ABDOMINAL HERNIA  REPAIR  11/10/2010   mesh  . CATARACT EXTRACTION, BILATERAL    . COLONOSCOPY W/ POLYPECTOMY  2001,2007,2013   F/U every 5 years; Dr Sharlett Iles  . KNEE ARTHROSCOPY Left 09/22/2010  . KNEE ARTHROSCOPY Right 01/02/2011  . PROSTATE BIOPSY  2000   Dr.Wrenn  . Rectal Fistula repair  07/2004  . REPLACEMENT TOTAL KNEE Right 02/10/2011  . ROTATOR CUFF REPAIR Right 2007   Dr.Jeff Beane  . UPPER GI ENDOSCOPY  2013   S/P dilation    reports that he has never smoked. He has never used smokeless tobacco. He reports that he does not drink alcohol or use drugs. family history includes Brain cancer in his maternal aunt; Crohn's disease in an other family member; Ovarian cancer in his mother. No Known Allergies Current Outpatient Medications on File Prior to Visit  Medication Sig Dispense Refill  . acetaminophen (TYLENOL) 650 MG CR tablet Take 1,300 mg by mouth 2 (two) times daily as needed for pain.     [name] albuterol (PROVENTIL HFA;VENTOLIN HFA) 108 (90 Base) MCG/ACT inhaler Inhale 2 puffs into the lungs every 6 (six) hours as needed for wheezing or shortness of breath. (Patient not taking: Reported on 03/05/2018) 1 Inhaler 11  . aspirin 81 MG EC tablet Take 81 mg by mouth every evening.     . CEPHALEXIN PO Take by mouth. Takes according to Dr. Randel Pigg instructions-  2 pills 1 hour before procedure, 1 pill 6 hours after procedure, then 1 pill 6 hours after that    . Cholecalciferol (VITAMIN D3) 1000 UNITS CAPS Take 1,000 Units by mouth every morning.     . cyanocobalamin (,VITAMIN B-12,) 1000 MCG/ML injection INJECT 1 ML (1000 MCG TOTAL) INTO THE MUSCLE EVERY 30 (THIRTY) DAYS. KEEP SEPT APPT W/NEW PCP FOR FUTURE REFILLS. 30 mL 5  . hydrocortisone cream 1 % Apply to affected area 2 times daily (Patient not taking: Reported on 03/19/2018) 15 g 0  . meclizine (ANTIVERT) 25 MG tablet Take 1 tablet (25 mg total) by mouth 3 (three) times daily as needed for dizziness. (Patient not taking: Reported on 03/19/2018) 30 tablet 0   . Multiple Vitamin (MULTIVITAMIN) capsule Take 1 capsule by mouth 2 (two) times daily.     . NON FORMULARY Pain aid ESF: takes prn as needed for pain    . omeprazole (PRILOSEC) 40 MG capsule TAKE 1 CAPSULE BY MOUTH TWO TIMES DAILY 180 capsule 0  . OVER THE COUNTER MEDICATION Take 1 tablet by mouth daily. Memory tablet    . TUBERCULIN SYR 1CC/27GX1/2" (B-D TB SYRINGE 1CC/27GX1/2") 27G X 1/2" 1 ML MISC Use monthly to give B12 injections 12 each 0  . Vitamin Mixture (ESTER-C PO) Take by mouth daily.     No current facility-administered medications on file prior to visit.     Observations/Objective: Alert, NAD, appropriate mood and affect, resps normal, cn 2-12 intact, moves all 4s, no visible rash or swelling Lab Results  Component Value Date   WBC 5.5 03/10/2018   HGB 14.7 03/10/2018   HCT 44.6 03/10/2018   PLT 243 03/10/2018   GLUCOSE 102 (H) 03/10/2018   CHOL 226 (H) 12/11/2017   TRIG 194.0 (H) 12/11/2017   HDL 47.10 12/11/2017   LDLDIRECT 144.2 03/04/2013   LDLCALC 140 (H) 12/11/2017   ALT 25 03/10/2018   AST 31 03/10/2018   NA 142 03/10/2018   K 4.0 03/10/2018   CL 105 03/10/2018   CREATININE 0.91 03/10/2018   BUN 13 03/10/2018   CO2 27 03/10/2018   TSH 1.56 12/11/2017   PSA 0.67 12/11/2017   INR 1.12 02/03/2011   HGBA1C 5.6 12/11/2017   MICROALBUR 0.9 04/10/2016   Assessment and Plan: See notes  Follow Up Instructions: See notes   I discussed the assessment and treatment plan with the patient. The patient was provided an opportunity to ask questions and all were answered. The patient agreed with the plan and demonstrated an understanding of the instructions.   The patient was advised to call back or seek an in-person evaluation if the symptoms worsen or if the condition fails to improve as anticipated  Cathlean Cower, MD

## 2019-01-28 NOTE — Patient Instructions (Signed)
Please continue all other medications as before, and refills have been done if requested.  Please have the pharmacy call with any other refills you may need.  Please keep your appointments with your specialists as you may have planned  Please go to the LAB in the Basement (turn left off the elevator) for the tests to be done as soon as you are notified COVD negative  You will be contacted by phone if any changes need to be made immediately.  Otherwise, you will receive a letter about your results with an explanation, but please check with MyChart first.  Please remember to sign up for MyChart if you have not done so, as this will be important to you in the future with finding out test results, communicating by private email, and scheduling acute appointments online when needed.  Please go to the ED for recurrent bleeding lasting > 15-30 min.

## 2019-01-31 ENCOUNTER — Other Ambulatory Visit (INDEPENDENT_AMBULATORY_CARE_PROVIDER_SITE_OTHER): Payer: 59

## 2019-01-31 DIAGNOSIS — R1084 Generalized abdominal pain: Secondary | ICD-10-CM | POA: Diagnosis not present

## 2019-01-31 DIAGNOSIS — K921 Melena: Secondary | ICD-10-CM | POA: Diagnosis not present

## 2019-01-31 LAB — BASIC METABOLIC PANEL
BUN: 14 mg/dL (ref 6–23)
CO2: 28 mEq/L (ref 19–32)
Calcium: 9.4 mg/dL (ref 8.4–10.5)
Chloride: 105 mEq/L (ref 96–112)
Creatinine, Ser: 0.93 mg/dL (ref 0.40–1.50)
GFR: 78.67 mL/min (ref >60.00)
Glucose, Bld: 101 mg/dL — ABNORMAL HIGH (ref 70–99)
Potassium: 4.2 mEq/L (ref 3.5–5.1)
Sodium: 140 mEq/L (ref 135–145)

## 2019-01-31 LAB — URINALYSIS, ROUTINE W REFLEX MICROSCOPIC
Bilirubin Urine: NEGATIVE
Hgb urine dipstick: NEGATIVE
Ketones, ur: NEGATIVE
Leukocytes,Ua: NEGATIVE
Nitrite: NEGATIVE
Specific Gravity, Urine: 1.02 (ref 1.000–1.030)
Total Protein, Urine: NEGATIVE
Urine Glucose: NEGATIVE
Urobilinogen, UA: 0.2 (ref 0.0–1.0)
pH: 6.5 (ref 5.0–8.0)

## 2019-01-31 LAB — CBC WITH DIFFERENTIAL/PLATELET
Basophils Absolute: 0.1 10*3/uL (ref 0.0–0.1)
Basophils Relative: 1.1 % (ref 0.0–3.0)
Eosinophils Absolute: 0.4 10*3/uL (ref 0.0–0.7)
Eosinophils Relative: 7.7 % — ABNORMAL HIGH (ref 0.0–5.0)
HCT: 42.5 % (ref 39.0–52.0)
Hemoglobin: 14.3 g/dL (ref 13.0–17.0)
Lymphocytes Relative: 24.9 % (ref 12.0–46.0)
Lymphs Abs: 1.4 10*3/uL (ref 0.7–4.0)
MCHC: 33.7 g/dL (ref 30.0–36.0)
MCV: 91.4 fl (ref 78.0–100.0)
Monocytes Absolute: 0.5 10*3/uL (ref 0.1–1.0)
Monocytes Relative: 7.9 % (ref 3.0–12.0)
Neutro Abs: 3.4 10*3/uL (ref 1.4–7.7)
Neutrophils Relative %: 58.4 % (ref 43.0–77.0)
Platelets: 253 10*3/uL (ref 150.0–400.0)
RBC: 4.65 Mil/uL (ref 4.22–5.81)
RDW: 13.5 % (ref 11.5–15.5)
WBC: 5.8 10*3/uL (ref 4.0–10.5)

## 2019-01-31 LAB — HEPATIC FUNCTION PANEL
ALT: 21 U/L (ref 0–53)
AST: 25 U/L (ref 0–37)
Albumin: 4.6 g/dL (ref 3.5–5.2)
Alkaline Phosphatase: 74 U/L (ref 39–117)
Bilirubin, Direct: 0.2 mg/dL (ref 0.0–0.3)
Total Bilirubin: 0.6 mg/dL (ref 0.2–1.2)
Total Protein: 7.4 g/dL (ref 6.0–8.3)

## 2019-01-31 LAB — LIPASE: Lipase: 31 U/L (ref 11.0–59.0)

## 2019-02-04 ENCOUNTER — Encounter: Payer: Self-pay | Admitting: Internal Medicine

## 2019-05-19 ENCOUNTER — Other Ambulatory Visit: Payer: Self-pay | Admitting: Internal Medicine

## 2019-06-07 ENCOUNTER — Other Ambulatory Visit: Payer: Self-pay | Admitting: Gastroenterology

## 2019-06-07 DIAGNOSIS — K2270 Barrett's esophagus without dysplasia: Secondary | ICD-10-CM

## 2019-06-23 ENCOUNTER — Other Ambulatory Visit: Payer: Self-pay | Admitting: Gastroenterology

## 2019-06-23 DIAGNOSIS — K2270 Barrett's esophagus without dysplasia: Secondary | ICD-10-CM

## 2019-08-02 ENCOUNTER — Telehealth: Payer: Self-pay | Admitting: Adult Health

## 2019-08-02 NOTE — Telephone Encounter (Signed)
Patient called about Positive Covid test.  2 patient identifiers confirmed.  Date Tested Positive: 08/02/2019  Date of Symptom onset: 07/26/2019    Symptoms: Cough, fatigue, decreased appetite, chills, no taste.  High Risk for complications: Age of 79 years old (computer says he is obese, BMI is 32.84  I reviewed with Asani that he is eligible to receive a monoclonal antibody infusion as he is at high risk for being hospitalized or developing severe COVID due to his age.  I reviewed with him the mechanism of action, and procedure of receiving the infusion, along with adverse effects.  [facility] wishes to think about it at this time, and will call us back if he decides to receive it.      Wilber Bihari, NP

## 2019-08-03 ENCOUNTER — Other Ambulatory Visit: Payer: Self-pay | Admitting: Physician Assistant

## 2019-08-03 DIAGNOSIS — U071 COVID-19: Secondary | ICD-10-CM

## 2019-08-03 NOTE — Telephone Encounter (Signed)
  I connected by phone with Adrian Burke on 08/03/2019 at 8:23 AM to discuss the potential use of an new treatment for mild to moderate COVID-19 viral infection in non-hospitalized patients.  This patient is a 79 y.o. male that meets the FDA criteria for Emergency Use Authorization of bamlanivimab or casirivimab\imdevimab.  Has a (+) direct SARS-CoV-2 viral test result  Has mild or moderate COVID-19   Is ? 79 years of age and weighs ? 40 kg  Is NOT hospitalized due to COVID-19  Is NOT requiring oxygen therapy or requiring an increase in baseline oxygen flow rate due to COVID-19  Is within 10 days of symptom onset  Has at least one of the high risk factor(s) for progression to severe COVID-19 and/or hospitalization as defined in EUA.  Specific high risk criteria : >/= 79 yo    I have spoken and communicated the following to the patient or parent/caregiver:  1. FDA has authorized the emergency use of bamlanivimab and casirivimab\imdevimab for the treatment of mild to moderate COVID-19 in adults and pediatric patients with positive results of direct SARS-CoV-2 viral testing who are 80 years of age and older weighing at least 40 kg, and who are at high risk for progressing to severe COVID-19 and/or hospitalization.  2. The significant known and potential risks and benefits of bamlanivimab and casirivimab\imdevimab, and the extent to which such potential risks and benefits are unknown.  3. Information on available alternative treatments and the risks and benefits of those alternatives, including clinical trials.  4. Patients treated with bamlanivimab and casirivimab\imdevimab should continue to self-isolate and use infection control measures (e.g., wear mask, isolate, social distance, avoid sharing personal items, clean and disinfect "high touch" surfaces, and frequent handwashing) according to CDC guidelines.   5. The patient or parent/caregiver has the option to accept or refuse  bamlanivimab or casirivimab\imdevimab .  After reviewing this information with the patient, The patient agreed to proceed with receiving the casirivimab\imdevimab infusion and will be provided a copy of the Fact sheet prior to receiving the infusion.    He has been set up for 08/05/19 @ 12:30pm. Directions were given.    Adrian Burke 08/03/2019 8:23 AM

## 2019-08-05 ENCOUNTER — Ambulatory Visit (HOSPITAL_COMMUNITY)
Admission: RE | Admit: 2019-08-05 | Discharge: 2019-08-05 | Disposition: A | Discharge status: Home / Self Care | Payer: Medicare Other | Source: Ambulatory Visit | Attending: Pulmonary Disease | Admitting: Pulmonary Disease

## 2019-08-05 DIAGNOSIS — Z23 Encounter for immunization: Secondary | ICD-10-CM | POA: Insufficient documentation

## 2019-08-05 DIAGNOSIS — U071 COVID-19: Secondary | ICD-10-CM | POA: Insufficient documentation

## 2019-08-05 MED ORDER — SODIUM CHLORIDE 0.9 % IV SOLN
Freq: Once | INTRAVENOUS | Status: AC
  Filled 2019-08-05: qty 10

## 2019-08-05 MED ORDER — METHYLPREDNISOLONE SODIUM SUCC 125 MG IJ SOLR: 125.0000 mg | Freq: Once | INTRAMUSCULAR | Status: DC | PRN

## 2019-08-05 MED ORDER — SODIUM CHLORIDE 0.9 % IV SOLN
INTRAVENOUS | Status: DC | PRN
  Administered 2019-08-05: 250 mL via INTRAVENOUS

## 2019-08-05 MED ORDER — FAMOTIDINE IN NACL 20-0.9 MG/50ML-% IV SOLN: 20.0000 mg | Freq: Once | INTRAVENOUS | Status: DC | PRN

## 2019-08-05 MED ORDER — EPINEPHRINE 0.3 MG/0.3ML IJ SOAJ: 0.3000 mg | Freq: Once | INTRAMUSCULAR | Status: DC | PRN

## 2019-08-05 MED ORDER — DIPHENHYDRAMINE HCL 50 MG/ML IJ SOLN: 50.0000 mg | Freq: Once | INTRAMUSCULAR | Status: DC | PRN

## 2019-08-05 MED ORDER — ALBUTEROL SULFATE HFA 108 (90 BASE) MCG/ACT IN AERS: 2.0000 | INHALATION_SPRAY | Freq: Once | RESPIRATORY_TRACT | Status: DC | PRN

## 2019-08-05 NOTE — Discharge Instructions (Signed)
10 Things You Can Do to Manage Your COVID-19 Symptoms at Home If you have possible or confirmed COVID-19: 1. Stay home from work and school. And stay away from other public places. If you must go out, avoid using any kind of public transportation, ridesharing, or taxis. 2. Monitor your symptoms carefully. If your symptoms get worse, call your healthcare provider immediately. 3. Get rest and stay hydrated. 4. If you have a medical appointment, call the healthcare provider ahead of time and tell them that you have or may have COVID-19. 5. For medical emergencies, call 911 and notify the dispatch personnel that you have or may have COVID-19. 6. Cover your cough and sneezes with a tissue or use the inside of your elbow. 7. Wash your hands often with soap and water for at least 20 seconds or clean your hands with an alcohol-based hand sanitizer that contains at least 60% alcohol. 8. As much as possible, stay in a specific room and away from other people in your home. Also, you should use a separate bathroom, if available. If you need to be around other people in or outside of the home, wear a mask. 9. Avoid sharing personal items with other people in your household, like dishes, towels, and bedding. 10. Clean all surfaces that are touched often, like counters, tabletops, and doorknobs. Use household cleaning sprays or wipes according to the label instructions. cdc.gov/coronavirus 01/15/2019 This information is not intended to replace advice given to you by your health care provider. Make sure you discuss any questions you have with your health care provider. Document Revised: 06/19/2019 Document Reviewed: 06/19/2019 Elsevier Patient Education  2020 Elsevier Inc.  

## 2019-08-05 NOTE — Progress Notes (Signed)
  Diagnosis: COVID-19  Physician: Dr. Joya Gaskins  Procedure: Cov[facility] Infusion Clinic Med: casirivimab\imdevimab infusion - Provided patient with casirivimab\imdevimab fact sheet for patients, parents and caregivers prior to infusion.  Complications: No immediate complications noted.  Discharge: Discharged home   Tia Masker 08/05/2019

## 2019-08-05 NOTE — Progress Notes (Signed)
  Diagnosis: COVID-19  Physician:  Procedure: Covid Infusion Clinic Med: casirivimab\imdevimab infusion - Provided patient with casirivimab\imdevimab fact sheet for patients, parents and caregivers prior to infusion.  Complications: No immediate complications noted.  Discharge: Discharged home   Adrian Burke 08/05/2019

## 2019-08-13 ENCOUNTER — Other Ambulatory Visit: Payer: Self-pay | Admitting: Physician Assistant

## 2019-08-25 ENCOUNTER — Telehealth: Payer: Self-pay | Admitting: Gastroenterology

## 2019-08-25 DIAGNOSIS — K2270 Barrett's esophagus without dysplasia: Secondary | ICD-10-CM

## 2019-08-25 NOTE — Telephone Encounter (Signed)
Pt stated that insurance has switched to mail order pharmacy.  Pt requested 90-day supply of omeprazole faxed to [facility] [phone].  Pt requested a 30-day supply omeprazole sent to [facility] on [address].

## 2019-08-26 MED ORDER — OMEPRAZOLE 40 MG PO CPDR: DELAYED_RELEASE_CAPSULE | ORAL | 0 refills | Status: DC

## 2019-08-26 NOTE — Telephone Encounter (Signed)
Left a message for patient to return my call. Patient is due for follow up visit.

## 2019-08-27 NOTE — Telephone Encounter (Signed)
Left a message for patient to return my call. 

## 2019-09-29 MED ORDER — OMEPRAZOLE 40 MG PO CPDR: DELAYED_RELEASE_CAPSULE | ORAL | 0 refills | Status: DC

## 2019-09-29 NOTE — Addendum Note (Signed)
Addended by: Dorisann Frames L on: 09/29/2019 02:26 PM   Modules accepted: Orders

## 2019-09-29 NOTE — Telephone Encounter (Addendum)
Patient is calling to follow up on request states he only has a few left. I advised him him that a follow up appt is needed in order to fill new script. He has made appt for 10/08/19 with Nevin Bloodgood. Patient is requesting we send some to  pharmacy because the mail order will not get to him before he runs out of them.

## 2019-09-29 NOTE — Telephone Encounter (Signed)
Prescription for 30 day supply sent to [facility] per patient's request. Left a message notifying patient of prescription refill.

## 2019-10-08 ENCOUNTER — Ambulatory Visit (INDEPENDENT_AMBULATORY_CARE_PROVIDER_SITE_OTHER): Payer: Medicare Other | Admitting: Nurse Practitioner

## 2019-10-08 ENCOUNTER — Other Ambulatory Visit: Payer: Self-pay

## 2019-10-08 ENCOUNTER — Encounter: Payer: Self-pay | Admitting: Nurse Practitioner

## 2019-10-08 VITALS — BP 123/70 | HR 77 | Temp 98.0°F | Ht 68.0 in | Wt 212.0 lb

## 2019-10-08 DIAGNOSIS — K2270 Barrett's esophagus without dysplasia: Secondary | ICD-10-CM

## 2019-10-08 DIAGNOSIS — K219 Gastro-esophageal reflux disease without esophagitis: Secondary | ICD-10-CM

## 2019-10-08 MED ORDER — OMEPRAZOLE 40 MG PO CPDR: DELAYED_RELEASE_CAPSULE | ORAL | 4 refills | Status: DC

## 2019-10-08 NOTE — Patient Instructions (Signed)
If you are age 79 or older, your body mass index should be between 23-30. Your Body mass index is 32.23 kg/m. If this is out of the aforementioned range listed, please consider follow up with your Primary Care Provider.  If you are age 19 or younger, your body mass index should be between 19-25. Your Body mass index is 32.23 kg/m. If this is out of the aformentioned range listed, please consider follow up with your Primary Care Provider.   We have sent the following medications to your pharmacy for you to pick up at your convenience: Omeprazole 40 mg  Follow up as needed.  Thank you for choosing me and [facility].   Tye Savoy, NP

## 2019-10-08 NOTE — Progress Notes (Signed)
IMPRESSION and PLAN:    79 year old male with history of internal hemorrhoids, diverticulosis, GERD  #Barrett's esophagus --Last EGD Sept 2019 with findings of GERD related esophagitis, endoscopically  Appeared to have short segment Barrett's but no evidence for it on biopsy.  --Continue omeprazole. A 90 day supply with 4 refills given --Surveillance EGD due September 2022  #Colon cancer screening --Last colonoscopy July 2018.  No recall due to age  HPI:    Primary GI: Dr. Fuller Plan  Chief complaint : Needs medication refill  Patient is 79 year old male with GERD/Barrett's esophagus.  He needs refill on omeprazole which he takes twice daily.  Asymptomatic on PPI.  He has no complaints but does offer that he occasionally chokes on his own saliva and pills can be difficult to swallow at times. He has no problems eating food  Data Reviewed:  01/31/2019 CBC normal CMP with glucose of 101, otherwise normal  EGD for Barrett's surveillance 03/19/2018 Esophageal mucosal changes suspicious for short-segment Barrett's esophagus. Biopsied. - Small hiatal hernia. - Normal duodenal bulb and second portion of the duodenum.  Review of systems:     No chest pain, no SOB, no fevers, no urinary sx   Past Medical History:  Diagnosis Date  . Anemia   . Anxiety   . Arthritis   . B12 deficiency    on monthly injections  . Barrett's esophagus 03/2012  . Cataract   . Chest pain    2006 and 2011 ( neg nuclear stress test 2004) MI R/Oed  . Chest pain at rest 3/28-29/14  . Diverticulosis   . Elevated homocysteine   . Elevated PSA    due to prostatis  . GERD (gastroesophageal reflux disease)   . Headache(784.0)    spinal tap as OP   . History of kidney stones   . Hyperlipidemia    NMR panel 2005, LDL 125 (1309/534), HDL 38, TG 87. LDL Goal=<130. [place] study LDL Goald=<160.   . Prostate nodule 2000   resolved w/o treatement; Dr Jeffie Pollock  . Tubular adenoma of colon 06/2006   Dr  Sharlett Iles  . Vertigo 03/10/2018   Patient went to ED for vertigo    Patient's surgical history, family medical history, social history, medications and allergies were all reviewed in Epic   Creatinine clearance cannot be calculated (Patient's most recent lab result is older than the maximum 21 days allowed.)  Current Outpatient Medications  Medication Sig Dispense Refill  . acetaminophen (TYLENOL) 650 MG CR tablet Take 1,300 mg by mouth 2 (two) times daily as needed for pain.     [name] albuterol (PROVENTIL HFA;VENTOLIN HFA) 108 (90 Base) MCG/ACT inhaler Inhale 2 puffs into the lungs every 6 (six) hours as needed for wheezing or shortness of breath. 1 Inhaler 11  . aspirin 81 MG EC tablet Take 81 mg by mouth every evening.     . CEPHALEXIN PO Take by mouth. Takes according to Dr. Randel Pigg instructions- 2 pills 1 hour before procedure, 1 pill 6 hours after procedure, then 1 pill 6 hours after that    . Cholecalciferol (VITAMIN D3) 1000 UNITS CAPS Take 1,000 Units by mouth every morning.     . cyanocobalamin (,VITAMIN B-12,) 1000 MCG/ML injection Inject 1 mL (1,000 mcg total) into the muscle every 30 (thirty) days. INJECT 1 ML INTO THE MUSCLE EVERY 30 DAYS. KEEP SEPTEMBER APPOINTMENT WITH NEW PCP FOR FUTURE REFILLS 1 mL 2  . Multiple Vitamin (MULTIVITAMIN)  capsule Take 1 capsule by mouth 2 (two) times daily.     . NON FORMULARY Pain aid ESF: takes prn as needed for pain    . omeprazole (PRILOSEC) 40 MG capsule TAKE 1 CAPSULE BY MOUTH TWO TIMES DAILY 60 capsule 0  . OVER THE COUNTER MEDICATION Take 1 tablet by mouth daily. Memory tablet    . TUBERCULIN SYR 1CC/27GX1/2" (B-D TB SYRINGE 1CC/27GX1/2") 27G X 1/2" 1 ML MISC Use monthly to give B12 injections 12 each 0  . Vitamin Mixture (ESTER-C PO) Take by mouth daily.    . Zinc 50 MG CAPS Take by mouth.     No current facility-administered medications for this visit.    Physical Exam:     BP 123/70   Pulse 77   Temp 98 F (36.7 C)   Ht 5\' 8"   (1.727 m)   Wt 212 lb (96.2 kg)   SpO2 99%   BMI 32.23 kg/m   GENERAL:  Pleasant male in NAD PSYCH: : Cooperative, normal affect CARDIAC:  RRR,  PULM: Normal respiratory effort, lungs CTA bilaterally, no wheezing ABDOMEN:  Nondistended, soft, nontender. No obvious masses, normal bowel sounds SKIN:  turgor, no lesions seen Musculoskeletal:  Normal muscle tone, normal strength NEURO: Alert and oriented x 3, no focal neurologic deficits   Tye Savoy , NP 10/08/2019, 10:07 AM

## 2019-10-08 NOTE — Progress Notes (Signed)
Reviewed and agree with management plan.  Acacia Latorre T. Jaclene Bartelt, MD FACG [facility]  

## 2019-10-15 ENCOUNTER — Telehealth: Payer: Self-pay | Admitting: Nurse Practitioner

## 2019-10-15 ENCOUNTER — Other Ambulatory Visit: Payer: Self-pay

## 2019-10-15 DIAGNOSIS — K2270 Barrett's esophagus without dysplasia: Secondary | ICD-10-CM

## 2019-10-15 MED ORDER — OMEPRAZOLE 40 MG PO CPDR: DELAYED_RELEASE_CAPSULE | ORAL | 4 refills | Status: DC

## 2019-10-15 NOTE — Telephone Encounter (Signed)
New prescription sent to [facility].

## 2019-10-15 NOTE — Telephone Encounter (Signed)
[facility] is calling- stating that the omeprazole was called into the wrong one. States it needs to be called into [facility] in [place], [place]. ([place] Phone number is [phone]- Fax number is- [phone]

## 2019-11-17 IMAGING — MR MR HEAD W/O CM
9 of 10 series · 43 of 48 positions shown · non-contrast
Comparison: None

CLINICAL DATA: Headache beginning last night. Nausea and vomiting
today. Light sensitivity. Nausea and vomiting is worse with
movement.

EXAM:
MRI HEAD WITHOUT CONTRAST
TECHNIQUE: Multiplanar, multiecho pulse sequences of the brain and surrounding
structures were obtained without intravenous contrast.

[Series 3: T1 · sagittal · 5.0mm · 0.47mm/px · 4 of 24 slices shown]
[im 1/24]
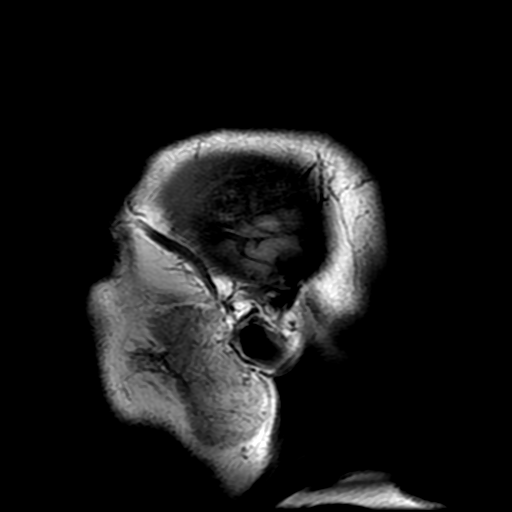
[im 8/24]
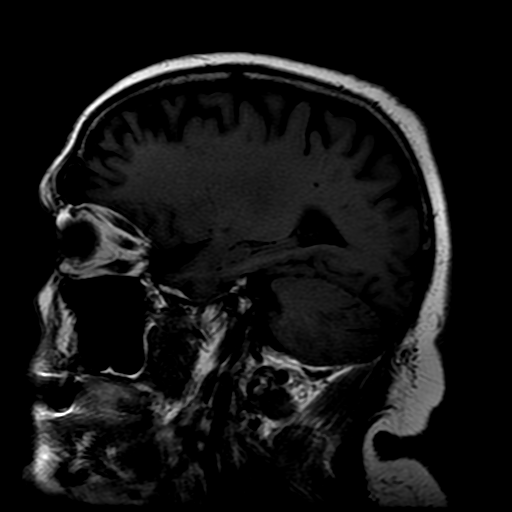
[im 16/24]
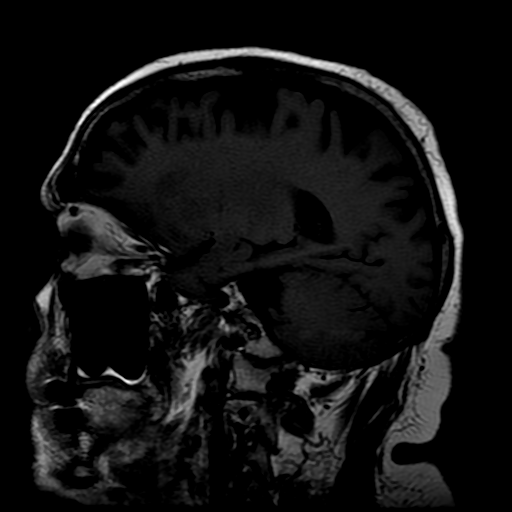
[im 24/24]
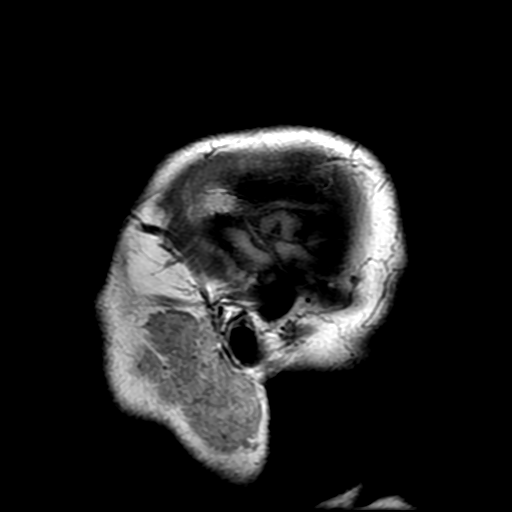

[Series 4: DWI · axial · 3.0mm · 1.09mm/px · z∈[-8,+133]mm · 11 of 98 slices shown (1 of 4)]
[im 1/98]
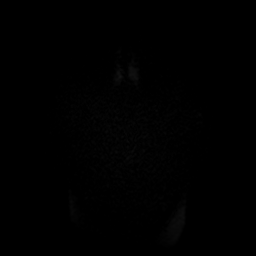
[im 10/98]
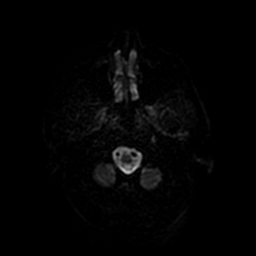
[im 20/98]
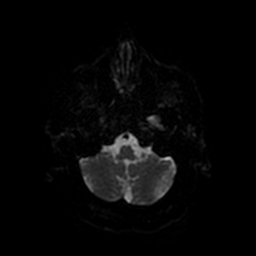
[im 30/98]
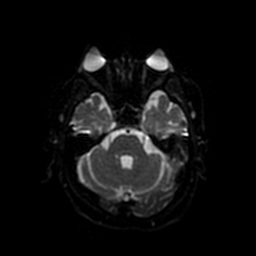
[im 39/98]
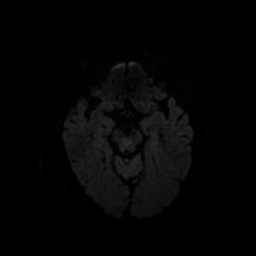
[im 49/98]
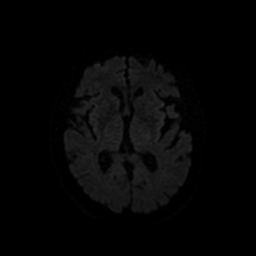
[im 59/98]
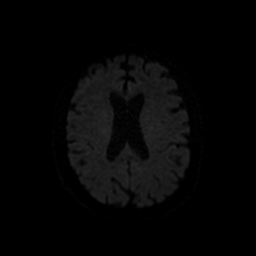
[im 68/98]
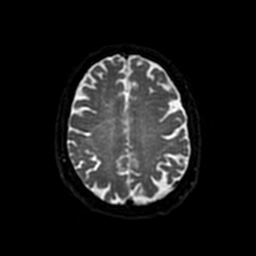
[im 78/98]
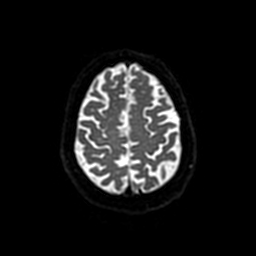
[im 88/98]
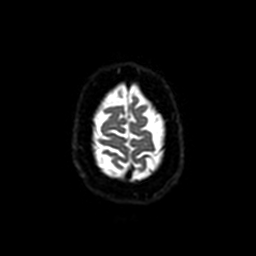
[im 98/98]
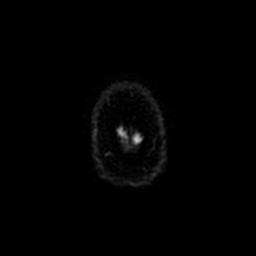

[Series 5: DWI · coronal · 4.0mm · 1.09mm/px · 9 of 88 slices shown (2 of 4)]
[im 1/88]
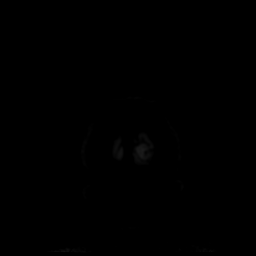
[im 11/88]
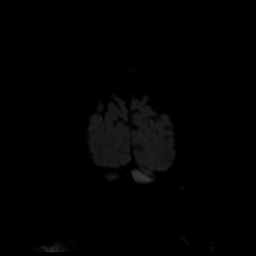
[im 22/88]
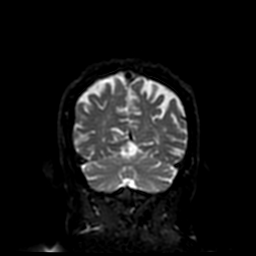
[im 33/88]
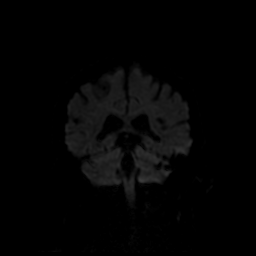
[im 44/88]
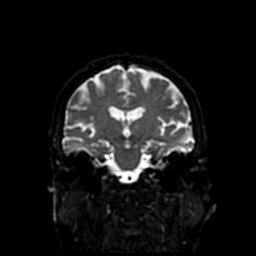
[im 55/88]
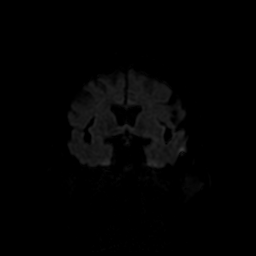
[im 66/88]
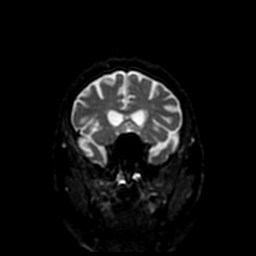
[im 77/88]
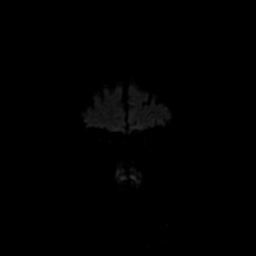
[im 88/88]
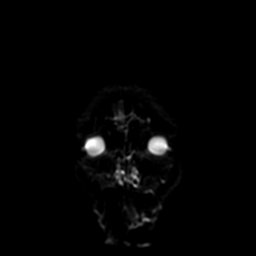

[Series 6: T2 · axial · 5.0mm · 0.90mm/px · z∈[-15,+129]mm · 2 of 22 slices shown (1 of 2)]
[im 1/22]
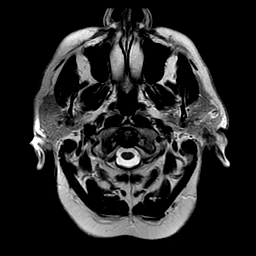
[im 22/22]
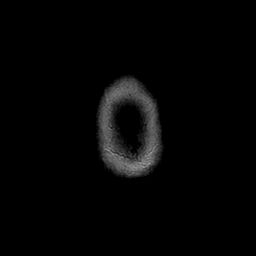

[Series 7: FLAIR · axial · 5.0mm · 0.45mm/px · z∈[-15,+129]mm · 2 of 22 slices shown]
[im 1/22]
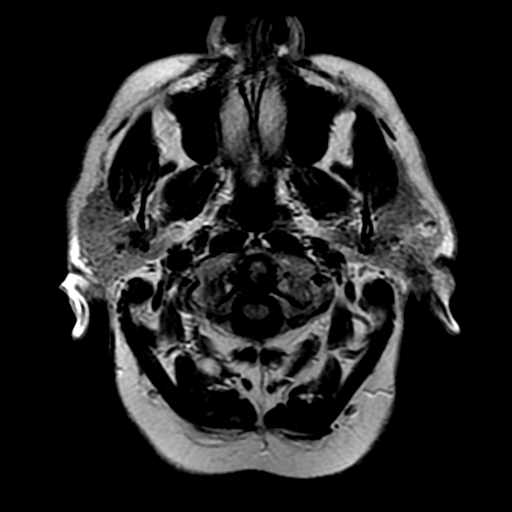
[im 22/22]
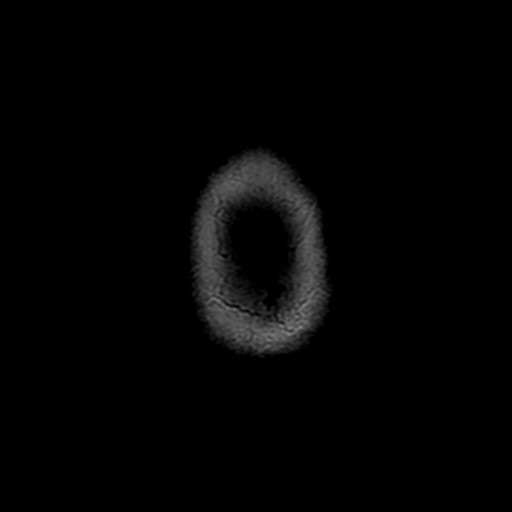

[Series 8: ax mpgr · axial · 5.0mm · 0.45mm/px · z∈[-15,+129]mm · 2 of 22 slices shown]
[im 1/22]
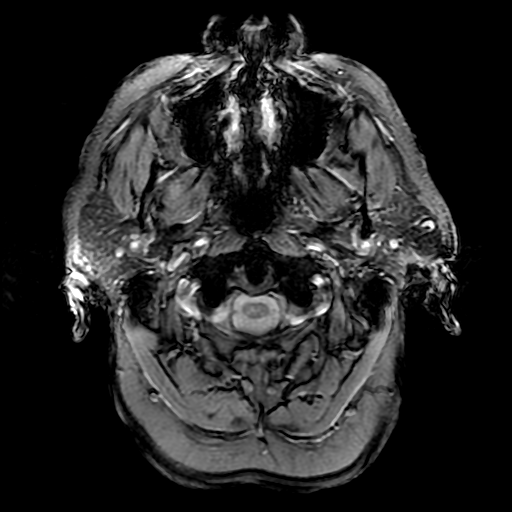
[im 22/22]
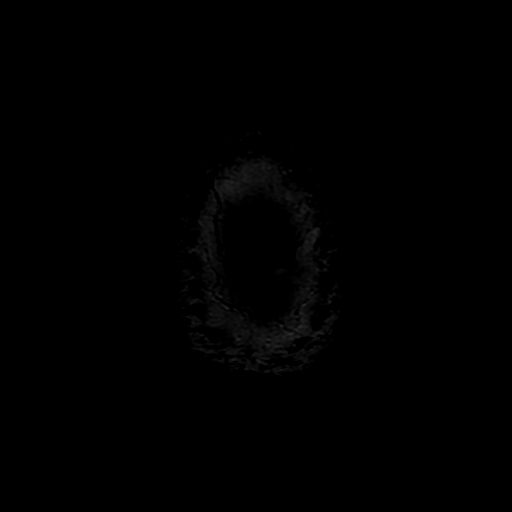

[Series 10: T2 · coronal · 5.0mm · 0.90mm/px · 3 of 28 slices shown (2 of 2)]
[im 1/28]
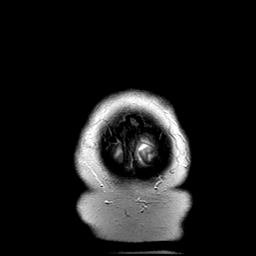
[im 14/28]
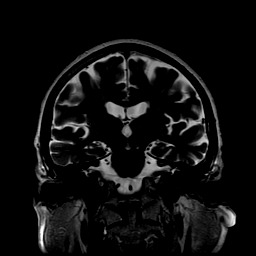
[im 28/28]
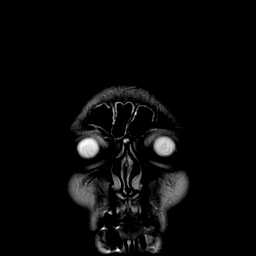

[Series 400: DWI · axial · 3.0mm · 1.09mm/px · z∈[-8,+133]mm · 5 of 49 slices shown (3 of 4)]
[im 1/49]
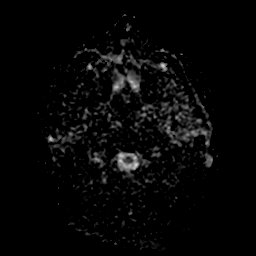
[im 13/49]
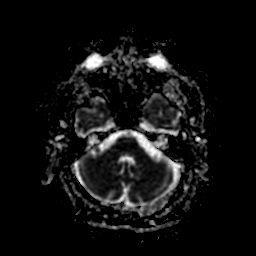
[im 25/49]
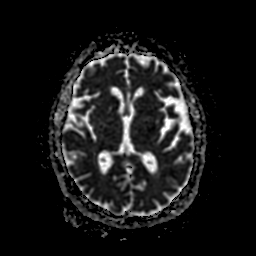
[im 37/49]
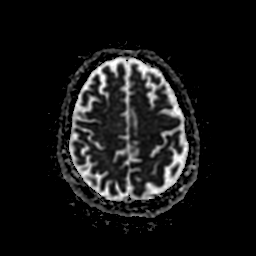
[im 49/49]
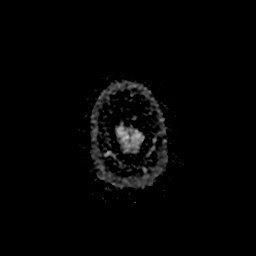

[Series 500: DWI · coronal · 4.0mm · 1.09mm/px · 5 of 44 slices shown (4 of 4)]
[im 1/44]
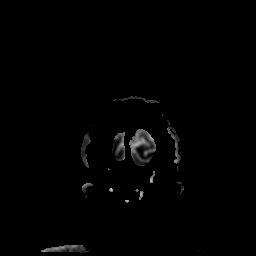
[im 11/44]
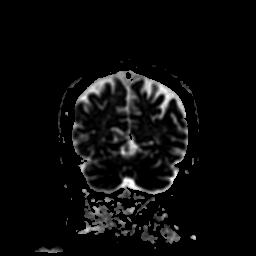
[im 22/44]
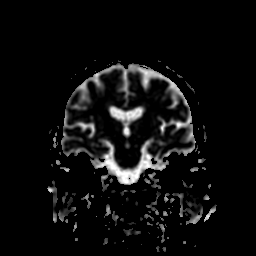
[im 33/44]
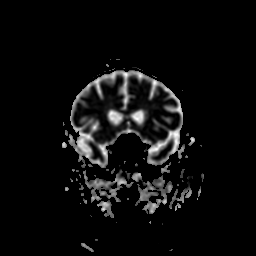
[im 44/44]
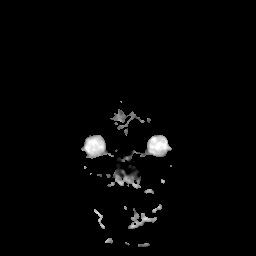

[43 of 48 positions shown; findings below may reference images not displayed]

FINDINGS: Brain: No acute infarct, hemorrhage, or mass lesion is present. The
ventricles are of normal size. Mild atrophy and white matter changes
are present, likely within normal limits for age. No significant
extraaxial fluid collection is present.

Vascular: Flow is present in the major intracranial arteries.

Skull and upper cervical spine: The skull base is within normal
limits. Craniocervical junction is normal. Degenerative changes are
present cervical spine central canal narrowing at C3-4. Marrow
signal is normal.

Sinuses/Orbits: Mild mucosal thickening is present in the anterior
ethmoid air cells and frontal sinuses bilaterally. No fluid levels
are present. The paranasal sinuses and mastoid air cells are
otherwise clear. Bilateral lens replacements are present. Globes and
orbits are otherwise within limits.
IMPRESSION: 1. Normal MRI appearance the brain for age no acute or focal lesion
to explain headaches or vomiting.
2. Minimal anterior paranasal sinus disease.

## 2020-01-02 ENCOUNTER — Other Ambulatory Visit: Payer: Self-pay | Admitting: Internal Medicine

## 2020-02-02 ENCOUNTER — Other Ambulatory Visit: Payer: Self-pay | Admitting: Internal Medicine

## 2020-02-11 ENCOUNTER — Telehealth: Payer: Self-pay | Admitting: Nurse Practitioner

## 2020-02-11 DIAGNOSIS — K2270 Barrett's esophagus without dysplasia: Secondary | ICD-10-CM

## 2020-02-11 MED ORDER — OMEPRAZOLE 40 MG PO CPDR: DELAYED_RELEASE_CAPSULE | ORAL | 3 refills | Status: DC

## 2020-02-11 NOTE — Telephone Encounter (Signed)
Refills have been sent in. 

## 2020-04-21 ENCOUNTER — Other Ambulatory Visit: Payer: Self-pay | Admitting: Internal Medicine

## 2020-06-04 ENCOUNTER — Other Ambulatory Visit: Payer: Self-pay | Admitting: Internal Medicine

## 2020-07-27 ENCOUNTER — Other Ambulatory Visit: Payer: Self-pay | Admitting: Internal Medicine

## 2020-08-01 ENCOUNTER — Emergency Department (HOSPITAL_COMMUNITY)
Admission: EM | Admit: 2020-08-01 | Discharge: 2020-08-01 | Disposition: A | Discharge status: Home / Self Care | Payer: Medicare HMO | Attending: Emergency Medicine | Admitting: Emergency Medicine

## 2020-08-01 ENCOUNTER — Emergency Department (HOSPITAL_COMMUNITY)
Admission: EM | Admit: 2020-08-01 | Discharge: 2020-08-01 | Disposition: A | Discharge status: Home / Self Care | Payer: Medicare HMO

## 2020-08-01 DIAGNOSIS — Z7901 Long term (current) use of anticoagulants: Secondary | ICD-10-CM | POA: Diagnosis not present

## 2020-08-01 DIAGNOSIS — Z85828 Personal history of other malignant neoplasm of skin: Secondary | ICD-10-CM | POA: Insufficient documentation

## 2020-08-01 DIAGNOSIS — Z20822 Contact with and (suspected) exposure to covid-19: Secondary | ICD-10-CM

## 2020-08-01 DIAGNOSIS — R070 Pain in throat: Secondary | ICD-10-CM | POA: Diagnosis not present

## 2020-08-01 DIAGNOSIS — I10 Essential (primary) hypertension: Secondary | ICD-10-CM | POA: Diagnosis not present

## 2020-08-01 DIAGNOSIS — J029 Acute pharyngitis, unspecified: Secondary | ICD-10-CM | POA: Diagnosis present

## 2020-08-01 DIAGNOSIS — U071 COVID-19: Secondary | ICD-10-CM | POA: Insufficient documentation

## 2020-08-01 DIAGNOSIS — Z87891 Personal history of nicotine dependence: Secondary | ICD-10-CM | POA: Diagnosis not present

## 2020-08-01 DIAGNOSIS — Z209 Contact with and (suspected) exposure to unspecified communicable disease: Secondary | ICD-10-CM | POA: Diagnosis not present

## 2020-08-01 DIAGNOSIS — Z96651 Presence of right artificial knee joint: Secondary | ICD-10-CM | POA: Diagnosis not present

## 2020-08-01 DIAGNOSIS — Z743 Need for continuous supervision: Secondary | ICD-10-CM | POA: Diagnosis not present

## 2020-08-01 DIAGNOSIS — R0902 Hypoxemia: Secondary | ICD-10-CM | POA: Diagnosis not present

## 2020-08-01 MED ORDER — BENZONATATE 100 MG PO CAPS
100.0000 mg | ORAL_CAPSULE | Freq: Once | ORAL | Status: AC
  Administered 2020-08-01: 100 mg via ORAL
  Filled 2020-08-01: qty 1

## 2020-08-01 MED ORDER — BENZONATATE 100 MG PO CAPS: 100.0000 mg | ORAL_CAPSULE | Freq: Three times a day (TID) | ORAL | 0 refills | Status: DC

## 2020-08-01 MED ORDER — DEXAMETHASONE SODIUM PHOSPHATE 10 MG/ML IJ SOLN
10.0000 mg | Freq: Once | INTRAMUSCULAR | Status: AC
  Administered 2020-08-01: 10 mg via INTRAMUSCULAR
  Filled 2020-08-01: qty 1

## 2020-08-01 NOTE — ED Triage Notes (Signed)
Pt reports that he feels like he has a cold. He states that he started feeling "under the weather" yesterday. Reports sore throat, cough, and stuffy nose. States that he has GERD and it aggravates his throat which is worsening his cold symptoms. He states that he had COVID in Jan of last year and was admitted to the [facility].

## 2020-08-01 NOTE — ED Provider Notes (Addendum)
[facility] DEPT Provider Note   CSN: [id number] Arrival date & time: 08/01/20  2108     History Chief Complaint  Patient presents with  . Sore Throat    Adrian Burke is a 80 y.o. male with a past medical history significant for chronic back pain who presents to the ED due to sore throat, cough, and nasal congestion x1 day.  Patient states he has been feeling "under the weather" over the past day which worsened today. Patient notes phlegm continues to get stuck in his throat causing his throat to feel like it is "swelling". Denies chest pain, shortness of breath, and lower extremity edema. Symptoms improve after coughing up the phlegm. Denies abdominal pain, nausea, vomiting, diarrhea, headache.  No treatment prior to arrival. Denies sick contacts and known COVID exposures. No fever or chills. He has COVID 1 year ago. He is unvaccinated.   History obtained from patient and past medical records. No interpreter used during encounter.      Past Medical History:  Diagnosis Date  . Arthritis   . Cancer ([place]) 2010   Melanoma back and under arm  . Cataracts, bilateral   . Chronic back pain   . Closed fracture of left distal radius   . Complication of anesthesia 06/15/15   slow to awaken, 2 days  . DDD (degenerative disc disease)   . Glaucoma   . Internal jugular (IJ) vein thromboembolism, acute, left ([place]) 01/30/2018  . Spinal stenosis     Patient Active Problem List   Diagnosis Date Noted  . S/P right TK revision 03/07/2018  . Internal jugular (IJ) vein thromboembolism, acute, left ([place]) 01/30/2018  . Joint infection ([place])   . Subacute osteomyelitis of right femur ([place])   . Subacute osteomyelitis of left tibia ([facility])   . Acute deep vein thrombosis (DVT) of left upper extremity ([place])   . Septic arthritis of knee, right ([place]) 11/27/2017  . PICC (peripherally inserted central catheter) in place 11/23/2017  . Septic arthritis ([place]), right knee joint  10/24/2017  . Abscess of right thigh 10/23/2017  . Leg abscess 10/23/2017  . Lumbar stenosis with neurogenic claudication 06/15/2015    Past Surgical History:  Procedure Laterality Date  . ANTERIOR LATERAL LUMBAR FUSION WITH PERCUTANEOUS SCREW 2 LEVEL Left 06/15/2015   Lumbar 1-2, Lumbar 2-3 Lateral Limbar Interbody Fusion  . APPENDECTOMY    . BACK SURGERY    . CARPAL TUNNEL RELEASE Right 2009   right-tf rt middle  . CERVICAL FUSION  2006  . COLONOSCOPY     x3  . DORSAL COMPARTMENT RELEASE Right 10/08/2013   Procedure: wristRELEASE 1st DORSAL COMPARTMENT ([place]) RIGHT SYNOVECTOMY EXTENSOR TENDONS;  Surgeon: Wynonia Sours, MD;  Location: [facility];  Service: Orthopedics;  Laterality: Right;  . EXCISIONAL TOTAL KNEE ARTHROPLASTY WITH ANTIBIOTIC SPACERS Right 12/03/2017   Procedure: Resection, arthroplasty right knee and placement of antibiotic spacer;  Surgeon: Paralee Cancel, MD;  Location: [facility];  Service: Orthopedics;  Laterality: Right;  90 mins  . IR PATIENT EVAL TECH 0-60 MINS  11/27/2017  . KNEE ARTHROSCOPY  2006   right  . KNEE ARTHROSCOPY Right 10/28/2017   Procedure: RIGHT KNEE ARTHROSCOPIC WASHOUT AND IRRIGATION AND DEBRIDEMENT OF RIGHT THIGH ABSCESS;  Surgeon: Marchia Bond, MD;  Location: [facility];  Service: Orthopedics;  Laterality: Right;  . LUMBAR FUSION  1998  . LUMBAR LAMINECTOMY  1978  . MELANOMA EXCISION WITH SENTINEL LYMPH NODE BIOPSY  2010  back-lt axillary snbx  . OPEN REDUCTION INTERNAL FIXATION (ORIF) DISTAL RADIAL FRACTURE Left 03/27/2017   Procedure: OPEN REDUCTION INTERNAL FIXATION (ORIF) LEFT  DISTAL RADIAL FRACTURE;  Surgeon: Daryll Brod, MD;  Location: [facility];  Service: Orthopedics;  Laterality: Left;  AX BLOCK  . REIMPLANTATION OF TOTAL KNEE Right 03/07/2018   Procedure: REMOVAL OF SPACER,/REVISION RIGHT TOTAL KMEE ARTHROPLASTY;  Surgeon: Paralee Cancel, MD;  Location: [facility];  Service: Orthopedics;  Laterality: Right;   120 mins        History reviewed. No pertinent family history.  Social History   Tobacco Use  . Smoking status: Former Smoker    Quit date: 10/03/1980    Years since quitting: 39.8  . Smokeless tobacco: Never Used  Vaping Use  . Vaping Use: Never used  Substance Use Topics  . Alcohol use: Yes    Comment: social  . Drug use: No    Home Medications Prior to Admission medications   Medication Sig Start Date End Date Taking? Authorizing Provider  benzonatate (TESSALON) 100 MG capsule Take 1 capsule (100 mg total) by mouth every 8 (eight) hours. 08/01/20  Yes Tuyen Uncapher, Druscilla Brownie, PA-C  acetaminophen (TYLENOL) 500 MG tablet Take 2 tablets (1,000 mg total) by mouth every 8 (eight) hours. 03/07/18   Danae Orleans, PA-C  docusate sodium (COLACE) 100 MG capsule Take 1 capsule (100 mg total) by mouth 2 (two) times daily. 03/07/18   Danae Orleans, PA-C  ferrous sulfate (FERROUSUL) 325 (65 FE) MG tablet Take 1 tablet (325 mg total) by mouth 3 (three) times daily with meals. 03/07/18   Danae Orleans, PA-C  furosemide (LASIX) 20 MG tablet Take 20 mg by mouth daily.  10/10/17   [provider]  gabapentin (NEURONTIN) 800 MG tablet Take 800-1,600 mg by mouth See admin instructions. Take 800 mg by mouth in the morning, take 800 mg by mouth at lunch and take 1600 mg by mouth at bedtime    [provider]  latanoprost (XALATAN) 0.005 % ophthalmic solution Place 1 drop into both eyes at bedtime.    [provider]  methocarbamol (ROBAXIN) 500 MG tablet Take 1 tablet (500 mg total) by mouth every 6 (six) hours as needed for muscle spasms. 03/07/18   Danae Orleans, PA-C  Multiple Vitamin (MULTIVITAMIN WITH MINERALS) TABS tablet Take 1 tablet by mouth daily. Centrum Silver    [provider]  Omega-3 Fatty Acids (OMEGA 3 PO) Take 1 capsule by mouth daily with breakfast.    [provider]  oxyCODONE (OXY IR/ROXICODONE) 5 MG immediate release tablet Take 1-3  tablets (5-15 mg total) by mouth every 4 (four) hours as needed for moderate pain or severe pain. 03/07/18   Danae Orleans, PA-C  polyethylene glycol (MIRALAX / GLYCOLAX) packet Take 17 g by mouth 2 (two) times daily. 03/07/18   Danae Orleans, PA-C  Probiotic Product (PROBIOTIC PO) Take 1 capsule by mouth daily with breakfast.    [provider]  Rivaroxaban 15 & 20 MG TBPK Take as directed on package: Start with one 15mg  tablet by mouth twice a day with food. On Day 22, switch to one 20mg  tablet once a day with food. Patient taking differently: Take 20 mg by mouth daily.  12/05/17   Lavina Hamman, MD  triamcinolone cream (KENALOG) 0.1 % Apply 1 application topically daily. After bathing in the morning 10/15/17   [provider]    Allergies    Patient has no known  allergies.  Review of Systems   Review of Systems  Constitutional: Negative for chills and fever.  HENT: Positive for congestion and sore throat. Negative for trouble swallowing and voice change.   Respiratory: Negative for shortness of breath.   Cardiovascular: Negative for chest pain.  Gastrointestinal: Negative for abdominal pain, diarrhea, nausea and vomiting.  Neurological: Negative for headaches.  All other systems reviewed and are negative.   Physical Exam Updated Vital Signs BP (!) 151/105 (BP Location: Right Arm)   Pulse 98   Temp 98.4 F (36.9 C) (Oral)   Resp 18   Ht 5\' 8"  (1.727 m)   Wt 97.5 kg   SpO2 97%   BMI 32.69 kg/m   Physical Exam Vitals and nursing note reviewed.  Constitutional:      General: He is not in acute distress.    Appearance: He is not ill-appearing.  HENT:     Head: Normocephalic.     Mouth/Throat:     Comments: Posterior oropharynx clear and mucous membranes moist, there is mild erythema but no edema or tonsillar exudates, uvula midline, normal phonation, no trismus, tolerating secretions without difficulty. Eyes:     Pupils: Pupils are equal, round, and  reactive to light.  Cardiovascular:     Rate and Rhythm: Normal rate and regular rhythm.     Pulses: Normal pulses.     Heart sounds: Normal heart sounds. No murmur heard. No friction rub. No gallop.   Pulmonary:     Effort: Pulmonary effort is normal.     Breath sounds: Normal breath sounds. No stridor. No wheezing or rhonchi.  Abdominal:     General: Abdomen is flat. There is no distension.     Palpations: Abdomen is soft.     Tenderness: There is no abdominal tenderness. There is no guarding or rebound.  Musculoskeletal:     Cervical back: Neck supple.     Comments: Able to move all 4 extremities without difficulty.   Skin:    General: Skin is warm and dry.  Neurological:     General: No focal deficit present.     Mental Status: He is alert.  Psychiatric:        Mood and Affect: Mood normal.        Behavior: Behavior normal.     ED Results / Procedures / Treatments   Labs (all labs ordered are listed, but only abnormal results are displayed) Labs Reviewed  SARS CORONAVIRUS 2 (TAT 6-24 HRS)    EKG None  Radiology No results found.  Procedures Procedures (including critical care time)  Medications Ordered in ED Medications  benzonatate (TESSALON) capsule 100 mg (has no administration in time range)  dexamethasone (DECADRON) injection 10 mg (has no administration in time range)    ED Course  I have reviewed the triage vital signs and the nursing notes.  Pertinent labs & imaging results that were available during my care of the patient were reviewed by me and considered in my medical decision making (see chart for details).    MDM Rules/Calculators/A&P                         80 year old male presents to the ED due to cough, sore throat, nasal congestion x1 day. Patient had COVID in January 2021. He is currently unvaccinated. Upon arrival, stable vitals. Elevated BP 151/105. Low suspicion for hypertensive urgency/emergency. Patient in no acute distress and  nontoxic-appearing. Physical exam reassuring. Mild erythema  in throat with no tonsillar hypertrophy. No abscess appreciated on exam. Lungs clear to auscultation bilaterally with no wheeze, stridor, rhonchi, rales. No meningismus to suggest meningitis. Suspect symptoms related to COVID infection vs other viral etiology. Low suspicion for bacterial infection. Decadron given to patient due to feelings of edema in throat. Airway patent on exam. No sign of respiratory distress. No stridor on exam.  Patient discharged with cough medication. Instructed patient to follow-up with PCP if symptoms do not improve within the next week.  COVID quarantine guidelines discussed with patient. Strict ED precautions discussed with patient. Patient states understanding and agrees to plan. Patient discharged home in no acute distress and stable vitals.  Discussed case with Dr. Gilford Raid who evaluated patient at bedside and agrees with assessment and plan.   RENALDO WORTHMAN was evaluated in Emergency Department on 08/01/2020 for the symptoms described in the history of present illness. He was evaluated in the context of the global COVID-19 pandemic, which necessitated consideration that the patient might be at risk for infection with the SARS-CoV-2 virus that causes COVID-19. Institutional protocols and algorithms that pertain to the evaluation of patients at risk for COVID-19 are in a state of rapid change based on information released by regulatory bodies including the CDC and federal and state organizations. These policies and algorithms were followed during the patient's care in the ED.  Final Clinical Impression(s) / ED Diagnoses Final diagnoses:  Suspected COVID-19 virus infection  Sore throat    Rx / DC Orders ED Discharge Orders          Ordered    benzonatate (TESSALON) 100 MG capsule  Every 8 hours        08/01/20 2204             Karie Kirks 08/01/20 2208    Isla Pence,  MD 08/01/20 2258    Suzy Bouchard, PA-C 08/03/20 1906    Isla Pence, MD 08/04/20 [phone]

## 2020-08-01 NOTE — Discharge Instructions (Addendum)
As discussed, your COVID test is pending.  Results will become available within the next 24 hours.  Continue to self quarantine until your results are available.  If results are positive, you must quarantine for 10 days since symptom onset.  I am sending you home with cough medication.  Take as needed.  You were given steroids here in the ER to help with any swelling of your throat.  Please follow-up with PCP if symptoms not improved within the next week.  Return to the ER for new or worsening symptoms.

## 2020-08-02 ENCOUNTER — Other Ambulatory Visit: Payer: Self-pay

## 2020-08-02 LAB — SARS CORONAVIRUS 2 (TAT 6-24 HRS): SARS Coronavirus 2: POSITIVE — AB

## 2020-08-02 NOTE — ED Provider Notes (Signed)
Received call from pharmacy regarding patient picking up prescription not correct patient. This visit with Mr. Adrian Burke 02/27/1940 was filed under incorrect name and he did not present to ED and visit 08/01/2020 note is meant for another patient.    Gareth Morgan, MD 08/02/20 [phone]

## 2020-08-04 ENCOUNTER — Telehealth: Payer: Self-pay

## 2020-08-04 ENCOUNTER — Other Ambulatory Visit: Payer: Self-pay | Admitting: Family

## 2020-08-04 DIAGNOSIS — U071 COVID-19: Secondary | ICD-10-CM

## 2020-08-04 NOTE — Telephone Encounter (Signed)
Called to discuss with patient about COVID-19 symptoms and the use of one of the available treatments for those with mild to moderate Covid symptoms and at a high risk of hospitalization.  Pt appears to qualify for outpatient treatment due to co-morbid conditions and/or a member of an at-risk group in accordance with the FDA Emergency Use Authorization.    Symptom onset: 07/31/20 Vaccinated: No Booster? No Immunocompromised? No Qualifiers: Adenoma of colon  Unable to reach pt - Reached pt.  [place]  Pt. Reports he infusion therapy last year.

## 2020-08-04 NOTE — Telephone Encounter (Signed)
I connected by phone with [name] on 08/04/2020 at 11:11 AM to discuss the potential use of a new treatment for mild to moderate COVID-19 viral infection in non-hospitalized patients.  This patient is a 80 y.o. male that meets the FDA criteria for Emergency Use Authorization of COVID monoclonal antibody sotrovimab.  Has a (+) direct SARS-CoV-2 viral test result  Has mild or moderate COVID-19   Is NOT hospitalized due to COVID-19  Is within 10 days of symptom onset  Has at least one of the high risk factor(s) for progression to severe COVID-19 and/or hospitalization as defined in EUA.  Specific high risk criteria : Older age (>/= 80 yo) and BMI > 25  Sx 1/15 Positive 1/16 in Epic  I have spoken and communicated the following to the patient or parent/caregiver regarding COVID monoclonal antibody treatment:  1. FDA has authorized the emergency use for the treatment of mild to moderate COVID-19 in adults and pediatric patients with positive results of direct SARS-CoV-2 viral testing who are 3 years of age and older weighing at least 40 kg, and who are at high risk for progressing to severe COVID-19 and/or hospitalization.  2. The significant known and potential risks and benefits of COVID monoclonal antibody, and the extent to which such potential risks and benefits are unknown.  3. Information on available alternative treatments and the risks and benefits of those alternatives, including clinical trials.  4. Patients treated with COVID monoclonal antibody should continue to self-isolate and use infection control measures (e.g., wear mask, isolate, social distance, avoid sharing personal items, clean and disinfect "high touch" surfaces, and frequent handwashing) according to CDC guidelines.   5. The patient or parent/caregiver has the option to accept or refuse COVID monoclonal antibody treatment.  After reviewing this information with the patient, the patient has agreed to  receive one of the available covid 19 monoclonal antibodies and will be provided an appropriate fact sheet prior to infusion.   Sent text message from work phone with appointment info and directions per his request.   Loel Dubonnet, NP 08/04/2020 11:11 AM

## 2020-08-05 ENCOUNTER — Ambulatory Visit (HOSPITAL_COMMUNITY)
Admission: RE | Admit: 2020-08-05 | Discharge: 2020-08-05 | Disposition: A | Discharge status: Home / Self Care | Payer: Medicare HMO | Source: Ambulatory Visit | Attending: Pulmonary Disease | Admitting: Pulmonary Disease

## 2020-08-05 DIAGNOSIS — U071 COVID-19: Secondary | ICD-10-CM | POA: Diagnosis not present

## 2020-08-05 MED ORDER — FAMOTIDINE IN NACL 20-0.9 MG/50ML-% IV SOLN: 20.0000 mg | Freq: Once | INTRAVENOUS | Status: DC | PRN

## 2020-08-05 MED ORDER — ALBUTEROL SULFATE HFA 108 (90 BASE) MCG/ACT IN AERS: 2.0000 | INHALATION_SPRAY | Freq: Once | RESPIRATORY_TRACT | Status: DC | PRN

## 2020-08-05 MED ORDER — SOTROVIMAB 500 MG/8ML IV SOLN
500.0000 mg | Freq: Once | INTRAVENOUS | Status: AC
  Administered 2020-08-05: 500 mg via INTRAVENOUS

## 2020-08-05 MED ORDER — EPINEPHRINE 0.3 MG/0.3ML IJ SOAJ: 0.3000 mg | Freq: Once | INTRAMUSCULAR | Status: DC | PRN

## 2020-08-05 MED ORDER — SODIUM CHLORIDE 0.9 % IV SOLN: INTRAVENOUS | Status: DC | PRN

## 2020-08-05 MED ORDER — DIPHENHYDRAMINE HCL 50 MG/ML IJ SOLN: 50.0000 mg | Freq: Once | INTRAMUSCULAR | Status: DC | PRN

## 2020-08-05 MED ORDER — METHYLPREDNISOLONE SODIUM SUCC 125 MG IJ SOLR: 125.0000 mg | Freq: Once | INTRAMUSCULAR | Status: DC | PRN

## 2020-08-05 NOTE — Progress Notes (Signed)
Diagnosis: COVID-19  Physician: Dr. Patrick Wright  Procedure: [facility] Infusion [facility] Med: Sotrovimab infusion - Provided patient with sotrovimab fact sheet for patients, parents, and caregivers prior to infusion.   Complications: No immediate complications noted  Discharge: Discharged home    

## 2020-08-05 NOTE — Discharge Instructions (Signed)

## 2020-08-05 NOTE — Progress Notes (Signed)
Patient reviewed Fact Sheet for Patients, Parents, and Caregivers for Emergency Use Authorization (EUA) of sotrovimab for the Treatment of Coronavirus. Patient also reviewed and is agreeable to the estimated cost of treatment. Patient is agreeable to proceed.   

## 2020-10-16 ENCOUNTER — Other Ambulatory Visit: Payer: Self-pay | Admitting: Internal Medicine

## 2020-10-21 ENCOUNTER — Ambulatory Visit (INDEPENDENT_AMBULATORY_CARE_PROVIDER_SITE_OTHER): Payer: Medicare HMO | Admitting: Internal Medicine

## 2020-10-21 ENCOUNTER — Other Ambulatory Visit: Payer: Self-pay

## 2020-10-21 ENCOUNTER — Encounter: Payer: Self-pay | Admitting: Internal Medicine

## 2020-10-21 VITALS — BP 136/80 | HR 78 | Temp 98.2°F | Ht 68.0 in | Wt 211.6 lb

## 2020-10-21 DIAGNOSIS — K2270 Barrett's esophagus without dysplasia: Secondary | ICD-10-CM

## 2020-10-21 DIAGNOSIS — Z125 Encounter for screening for malignant neoplasm of prostate: Secondary | ICD-10-CM

## 2020-10-21 DIAGNOSIS — E559 Vitamin D deficiency, unspecified: Secondary | ICD-10-CM | POA: Diagnosis not present

## 2020-10-21 DIAGNOSIS — E7800 Pure hypercholesterolemia, unspecified: Secondary | ICD-10-CM | POA: Diagnosis not present

## 2020-10-21 DIAGNOSIS — Z0001 Encounter for general adult medical examination with abnormal findings: Secondary | ICD-10-CM

## 2020-10-21 DIAGNOSIS — E538 Deficiency of other specified B group vitamins: Secondary | ICD-10-CM | POA: Diagnosis not present

## 2020-10-21 DIAGNOSIS — R7303 Prediabetes: Secondary | ICD-10-CM

## 2020-10-21 DIAGNOSIS — Z1159 Encounter for screening for other viral diseases: Secondary | ICD-10-CM

## 2020-10-21 LAB — URINALYSIS, ROUTINE W REFLEX MICROSCOPIC
Bilirubin Urine: NEGATIVE
Hgb urine dipstick: NEGATIVE
Ketones, ur: NEGATIVE
Leukocytes,Ua: NEGATIVE
Nitrite: NEGATIVE
RBC / HPF: NONE SEEN (ref 0–?)
Specific Gravity, Urine: 1.015 (ref 1.000–1.030)
Total Protein, Urine: NEGATIVE
Urine Glucose: NEGATIVE
Urobilinogen, UA: 0.2 (ref 0.0–1.0)
WBC, UA: NONE SEEN (ref 0–?)
pH: 7 (ref 5.0–8.0)

## 2020-10-21 LAB — LIPID PANEL
Cholesterol: 195 mg/dL (ref 0–200)
HDL: 48.4 mg/dL (ref >39.00)
NonHDL: 146.48
Total CHOL/HDL Ratio: 4
Triglycerides: 217 mg/dL — ABNORMAL HIGH (ref 0.0–149.0)
VLDL: 43.4 mg/dL — ABNORMAL HIGH (ref 0.0–40.0)

## 2020-10-21 LAB — HEPATIC FUNCTION PANEL
ALT: 20 U/L (ref 0–53)
AST: 24 U/L (ref 0–37)
Albumin: 4.3 g/dL (ref 3.5–5.2)
Alkaline Phosphatase: 71 U/L (ref 39–117)
Bilirubin, Direct: 0.1 mg/dL (ref 0.0–0.3)
Total Bilirubin: 0.6 mg/dL (ref 0.2–1.2)
Total Protein: 6.7 g/dL (ref 6.0–8.3)

## 2020-10-21 LAB — CBC WITH DIFFERENTIAL/PLATELET
Basophils Absolute: 0.1 10*3/uL (ref 0.0–0.1)
Basophils Relative: 1.6 % (ref 0.0–3.0)
Eosinophils Absolute: 0.4 10*3/uL (ref 0.0–0.7)
Eosinophils Relative: 6.6 % — ABNORMAL HIGH (ref 0.0–5.0)
HCT: 39.3 % (ref 39.0–52.0)
Hemoglobin: 13.4 g/dL (ref 13.0–17.0)
Lymphocytes Relative: 29 % (ref 12.0–46.0)
Lymphs Abs: 1.6 10*3/uL (ref 0.7–4.0)
MCHC: 34 g/dL (ref 30.0–36.0)
MCV: 89.5 fl (ref 78.0–100.0)
Monocytes Absolute: 0.5 10*3/uL (ref 0.1–1.0)
Monocytes Relative: 9.2 % (ref 3.0–12.0)
Neutro Abs: 2.9 10*3/uL (ref 1.4–7.7)
Neutrophils Relative %: 53.6 % (ref 43.0–77.0)
Platelets: 213 10*3/uL (ref 150.0–400.0)
RBC: 4.39 Mil/uL (ref 4.22–5.81)
RDW: 13.7 % (ref 11.5–15.5)
WBC: 5.4 10*3/uL (ref 4.0–10.5)

## 2020-10-21 LAB — LDL CHOLESTEROL, DIRECT: Direct LDL: 124 mg/dL

## 2020-10-21 LAB — BASIC METABOLIC PANEL
BUN: 14 mg/dL (ref 6–23)
CO2: 28 mEq/L (ref 19–32)
Calcium: 9.1 mg/dL (ref 8.4–10.5)
Chloride: 105 mEq/L (ref 96–112)
Creatinine, Ser: 0.9 mg/dL (ref 0.40–1.50)
GFR: 81.36 mL/min (ref >60.00)
Glucose, Bld: 86 mg/dL (ref 70–99)
Potassium: 3.9 mEq/L (ref 3.5–5.1)
Sodium: 140 mEq/L (ref 135–145)

## 2020-10-21 LAB — VITAMIN D 25 HYDROXY (VIT D DEFICIENCY, FRACTURES): VITD: 35.75 ng/mL (ref 30.00–100.00)

## 2020-10-21 LAB — VITAMIN B12: Vitamin B-12: >1506 pg/mL — ABNORMAL HIGH (ref 211–911)

## 2020-10-21 LAB — TSH: TSH: 2.13 u[IU]/mL (ref 0.35–4.50)

## 2020-10-21 LAB — HEMOGLOBIN A1C: Hgb A1c MFr Bld: 5.6 % (ref 4.6–6.5)

## 2020-10-21 LAB — PSA: PSA: 0.79 ng/mL (ref 0.10–4.00)

## 2020-10-21 MED ORDER — "TUBERCULIN SYRINGE 27G X 1/2"" 1 ML MISC": 0 refills | Status: DC

## 2020-10-21 MED ORDER — ALBUTEROL SULFATE HFA 108 (90 BASE) MCG/ACT IN AERS: 2.0000 | INHALATION_SPRAY | Freq: Four times a day (QID) | RESPIRATORY_TRACT | 11 refills | Status: AC | PRN

## 2020-10-21 MED ORDER — CYANOCOBALAMIN 1000 MCG/ML IJ SOLN
1000.0000 ug | Freq: Once | INTRAMUSCULAR | Status: AC
  Administered 2020-10-21: 1000 ug via INTRAMUSCULAR

## 2020-10-21 MED ORDER — CYANOCOBALAMIN 1000 MCG/ML IJ SOLN: INTRAMUSCULAR | 0 refills | Status: DC

## 2020-10-21 MED ORDER — OMEPRAZOLE 40 MG PO CPDR: DELAYED_RELEASE_CAPSULE | ORAL | 3 refills | Status: DC

## 2020-10-21 NOTE — Assessment & Plan Note (Signed)
Stable, cont PPI, f/u GI as planned

## 2020-10-21 NOTE — Patient Instructions (Signed)
You had the B12 shot today  Please continue all other medications as before, and refills have been done if requested.  Please have the pharmacy call with any other refills you may need.  Please continue your efforts at being more active, low cholesterol diet, and weight control.  You are otherwise up to date with prevention measures today.  Please keep your appointments with your specialists as you may have planned  Please go to the LAB at the blood drawing area for the tests to be done  You will be contacted by phone if any changes need to be made immediately.  Otherwise, you will receive a letter about your results with an explanation, but please check with MyChart first.  Please remember to sign up for MyChart if you have not done so, as this will be important to you in the future with finding out test results, communicating by private email, and scheduling acute appointments online when needed.  Please make an Appointment to return for your 1 year visit, or sooner if needed

## 2020-10-21 NOTE — Progress Notes (Signed)
Patient ID: Adrian Burke, male   DOB: 1941/03/08, 80 y.o.   MRN: [id number]         Chief Complaint:: wellness exam and low b12, hyperglycemia, hld, gerd       HPI:  Adrian Burke is a 80 y.o. male here for wellness exam; due for hep c screen o/w up to date with preventive referrals and immunizations. Does not want covid vax.  Pt is s/p covid infection twice                         Also, due for B12 shot today, and restart b12 injections at home after ran out recenlty, has been told per Dr hopper in the past he would need injections ongoing.   Pt denies polydipsia, polyuria, Has been trying to follow lower chol diet, does not want statin for now.  Denies worsening reflux, abd pain, dysphagia, n/v, bowel change or blood, but will need PPI refill.   Pt denies chest pain, increased sob or doe, wheezing, orthopnea, PND, increased LE swelling, palpitations, dizziness or syncope.  Denies new worsening focal neuro s/s.   Pt denies fever, wt loss, night sweats, loss of appetite, or other constitutional symptoms   Wt Readings from Last 3 Encounters:  10/21/20 211 lb 9.6 oz (96 kg)  08/01/20 215 lb (97.5 kg)  10/08/19 212 lb (96.2 kg)   BP Readings from Last 3 Encounters:  10/21/20 136/80  08/05/20 (!) 143/89  08/01/20 (!) 151/105   Immunization History  Administered Date(s) Administered  . Influenza,inj,Quad PF,6+ Mos 08/10/2016  . Pneumococcal Conjugate-13 04/08/2015  . Pneumococcal Polysaccharide-23 03/01/2012  . Td 05/17/1998   Health Maintenance Due  Topic Date Due  . Hepatitis C Screening  Never done      Past Medical History:  Diagnosis Date  . Anemia   . Anxiety   . Arthritis   . B12 deficiency    on monthly injections  . Barrett's esophagus 03/2012  . Cataract   . Chest pain    2006 and 2011 ( neg nuclear stress test 2004) MI R/Oed  . Chest pain at rest 3/28-29/14  . Diverticulosis   . Elevated homocysteine   . Elevated PSA    due to prostatis  . GERD  (gastroesophageal reflux disease)   . Headache(784.0)    spinal tap as OP   . History of kidney stones   . Hyperlipidemia    NMR panel 2005, LDL 125 (1309/534), HDL 38, TG 87. LDL Goal=<130. [place] study LDL Goald=<160.   . Prostate nodule 2000   resolved w/o treatement; Dr Jeffie Pollock  . Tubular adenoma of colon 06/2006   Dr Sharlett Iles  . Vertigo 03/10/2018   Patient went to ED for vertigo   Past Surgical History:  Procedure Laterality Date  . ABDOMINAL HERNIA REPAIR  11/10/2010   mesh  . CATARACT EXTRACTION, BILATERAL    . COLONOSCOPY W/ POLYPECTOMY  2001,2007,2013   F/U every 5 years; Dr Sharlett Iles  . KNEE ARTHROSCOPY Left 09/22/2010  . KNEE ARTHROSCOPY Right 01/02/2011  . PROSTATE BIOPSY  2000   Dr.Wrenn  . Rectal Fistula repair  07/2004  . REPLACEMENT TOTAL KNEE Right 02/10/2011  . ROTATOR CUFF REPAIR Right 2007   Dr.Jeff Beane  . UPPER GI ENDOSCOPY  2013   S/P dilation    reports that he has never smoked. He has never used smokeless tobacco. He reports that he does not drink alcohol and does not  use drugs. family history includes Brain cancer in his maternal aunt; Crohn's disease in an other family member; Ovarian cancer in his mother. No Known Allergies Current Outpatient Medications on File Prior to Visit  Medication Sig Dispense Refill  . acetaminophen (TYLENOL) 650 MG CR tablet Take 1,300 mg by mouth 2 (two) times daily as needed for pain.    [name] aspirin 81 MG EC tablet Take 81 mg by mouth every evening.    . CEPHALEXIN PO Take by mouth. Takes according to Dr. Randel Pigg instructions- 2 pills 1 hour before procedure, 1 pill 6 hours after procedure, then 1 pill 6 hours after that    . Cholecalciferol (VITAMIN D3) 1000 UNITS CAPS Take 1,000 Units by mouth every morning.     . Multiple Vitamin (MULTIVITAMIN) capsule Take 1 capsule by mouth 2 (two) times daily.    . NON FORMULARY Pain aid ESF: takes prn as needed for pain    . OVER THE COUNTER MEDICATION Take 1 tablet by mouth daily.  Memory tablet    . Vitamin Mixture (ESTER-C PO) Take by mouth daily.    . Zinc 50 MG CAPS Take by mouth.     No current facility-administered medications on file prior to visit.        ROS:  All others reviewed and negative.  Objective        PE:  BP 136/80 (BP Location: Right Arm, Patient Position: Sitting, Cuff Size: Large)   Pulse 78   Temp 98.2 F (36.8 C) (Oral)   Ht 5\' 8"  (1.727 m)   Wt 211 lb 9.6 oz (96 kg)   SpO2 96%   BMI 32.17 kg/m                 Constitutional: Pt appears in NAD               HENT: Head: NCAT.                Right Ear: External ear normal.                 Left Ear: External ear normal.                Eyes: . Pupils are equal, round, and reactive to light. Conjunctivae and EOM are normal               Nose: without d/c or deformity               Neck: Neck supple. Gross normal ROM               Cardiovascular: Normal rate and regular rhythm.                 Pulmonary/Chest: Effort normal and breath sounds without rales or wheezing.                Abd:  Soft, NT, ND, + BS, no organomegaly               Neurological: Pt is alert. At baseline orientation, motor grossly intact               Skin: Skin is warm. No rashes, no other new lesions, LE edema - none               Psychiatric: Pt behavior is normal without agitation   Micro: none  Cardiac tracings I have personally interpreted today:  none  Pertinent Radiological findings (summarize): none  Lab Results  Component Value Date   WBC 5.4 10/21/2020   HGB 13.4 10/21/2020   HCT 39.3 10/21/2020   PLT 213.0 10/21/2020   GLUCOSE 86 10/21/2020   CHOL 195 10/21/2020   TRIG 217.0 (H) 10/21/2020   HDL 48.40 10/21/2020   LDLDIRECT 124.0 10/21/2020   LDLCALC 140 (H) 12/11/2017   ALT 20 10/21/2020   AST 24 10/21/2020   NA 140 10/21/2020   K 3.9 10/21/2020   CL 105 10/21/2020   CREATININE 0.90 10/21/2020   BUN 14 10/21/2020   CO2 28 10/21/2020   TSH 2.13 10/21/2020   PSA 0.79 10/21/2020    INR 1.12 02/03/2011   HGBA1C 5.6 10/21/2020   MICROALBUR 0.9 04/10/2016   Assessment/Plan:  Adrian Burke is a 80 y.o. Unavailable [8] male with  has a past medical history of Anemia, Anxiety, Arthritis, B12 deficiency, Barrett's esophagus (03/2012), Cataract, Chest pain, Chest pain at rest (3/28-29/14), Diverticulosis, Elevated homocysteine, Elevated PSA, GERD (gastroesophageal reflux disease), Headache(784.0), History of kidney stones, Hyperlipidemia, Prostate nodule (2000), Tubular adenoma of colon (06/2006), and Vertigo (03/10/2018).  Encounter for well adult exam with abnormal findings Age and sex appropriate education and counseling updated with regular exercise and diet Referrals for preventative services - for hep c screen Immunizations addressed - none needed Smoking counseling  - none needed Evidence for depression or other mood disorder - none significant Most recent labs reviewed. I have personally reviewed and have noted: 1) the patient's medical and social history 2) The patient's current medications and supplements 3) The patient's height, weight, and BMI have been recorded in the chart   Barrett's esophagus Stable, cont PPI, f/u GI as planned  Prediabetes Lab Results  Component Value Date   HGBA1C 5.6 10/21/2020   Stable, pt to continue current medical treatment  - diet, wt control   Hyperlipidemia Lab Results  Component Value Date   LDLCALC 140 (H) 12/11/2017   Stable, pt to continue current low chol diet, does not want statin for now   B12 deficiency Ok to restart b12 injections, for f/u b12 with labs  Followup: Return in about 1 year (around 10/21/2021).  Cathlean Cower, MD 10/21/2020 7:12 PM [facility]

## 2020-10-21 NOTE — Assessment & Plan Note (Signed)
Age and sex appropriate education and counseling updated with regular exercise and diet Referrals for preventative services - for hep c screen Immunizations addressed - none needed Smoking counseling  - none needed Evidence for depression or other mood disorder - none significant Most recent labs reviewed. I have personally reviewed and have noted: 1) the patient's medical and social history 2) The patient's current medications and supplements 3) The patient's height, weight, and BMI have been recorded in the chart  

## 2020-10-21 NOTE — Assessment & Plan Note (Signed)
Ok to restart b12 injections, for f/u b12 with labs

## 2020-10-21 NOTE — Assessment & Plan Note (Signed)
Lab Results  Component Value Date   HGBA1C 5.6 10/21/2020   Stable, pt to continue current medical treatment  - diet, wt control

## 2020-10-21 NOTE — Assessment & Plan Note (Signed)
Lab Results  Component Value Date   LDLCALC 140 (H) 12/11/2017   Stable, pt to continue current low chol diet, does not want statin for now

## 2020-10-22 LAB — HEPATITIS C ANTIBODY
Hepatitis C Ab: NONREACTIVE
SIGNAL TO CUT-OFF: 0.01 (ref <1.00)

## 2020-12-30 ENCOUNTER — Other Ambulatory Visit: Payer: Self-pay

## 2020-12-30 ENCOUNTER — Ambulatory Visit (INDEPENDENT_AMBULATORY_CARE_PROVIDER_SITE_OTHER): Payer: Medicare HMO

## 2020-12-30 DIAGNOSIS — E538 Deficiency of other specified B group vitamins: Secondary | ICD-10-CM

## 2020-12-30 MED ORDER — CYANOCOBALAMIN 1000 MCG/ML IJ SOLN
1000.0000 ug | Freq: Once | INTRAMUSCULAR | Status: AC
  Administered 2020-12-30: 1000 ug via INTRAMUSCULAR

## 2020-12-30 NOTE — Progress Notes (Signed)
Pt here for monthly B12 injection per Dr John  B12 1000mcg given IM, and pt tolerated injection well.   

## 2021-01-12 DIAGNOSIS — M1990 Unspecified osteoarthritis, unspecified site: Secondary | ICD-10-CM | POA: Diagnosis not present

## 2021-01-12 DIAGNOSIS — Z6833 Body mass index (BMI) 33.0-33.9, adult: Secondary | ICD-10-CM | POA: Diagnosis not present

## 2021-01-12 DIAGNOSIS — Z008 Encounter for other general examination: Secondary | ICD-10-CM | POA: Diagnosis not present

## 2021-01-12 DIAGNOSIS — J42 Unspecified chronic bronchitis: Secondary | ICD-10-CM | POA: Diagnosis not present

## 2021-01-12 DIAGNOSIS — Z809 Family history of malignant neoplasm, unspecified: Secondary | ICD-10-CM | POA: Diagnosis not present

## 2021-01-12 DIAGNOSIS — K2100 Gastro-esophageal reflux disease with esophagitis, without bleeding: Secondary | ICD-10-CM | POA: Diagnosis not present

## 2021-01-12 DIAGNOSIS — R030 Elevated blood-pressure reading, without diagnosis of hypertension: Secondary | ICD-10-CM | POA: Diagnosis not present

## 2021-01-12 DIAGNOSIS — E669 Obesity, unspecified: Secondary | ICD-10-CM | POA: Diagnosis not present

## 2021-01-12 DIAGNOSIS — D649 Anemia, unspecified: Secondary | ICD-10-CM | POA: Diagnosis not present

## 2021-03-22 ENCOUNTER — Encounter: Payer: Self-pay | Admitting: Gastroenterology

## 2021-04-26 ENCOUNTER — Encounter: Payer: Self-pay | Admitting: Gastroenterology

## 2021-04-26 ENCOUNTER — Ambulatory Visit: Payer: Medicare HMO | Admitting: Gastroenterology

## 2021-04-26 VITALS — BP 140/80 | HR 75 | Ht 68.0 in | Wt 216.2 lb

## 2021-04-26 DIAGNOSIS — K921 Melena: Secondary | ICD-10-CM

## 2021-04-26 DIAGNOSIS — K2270 Barrett's esophagus without dysplasia: Secondary | ICD-10-CM | POA: Diagnosis not present

## 2021-04-26 DIAGNOSIS — K219 Gastro-esophageal reflux disease without esophagitis: Secondary | ICD-10-CM

## 2021-04-26 MED ORDER — PLENVU 140 G PO SOLR: 1.0000 | Freq: Once | ORAL | 0 refills | Status: AC

## 2021-04-26 NOTE — Patient Instructions (Signed)
You have been scheduled for an endoscopy and colonoscopy. Please follow the written instructions given to you at your visit today. Please pick up your prep supplies at the pharmacy within the next 1-3 days. If you use inhalers (even only as needed), please bring them with you on the day of your procedure.  Due to recent changes in healthcare laws, you may see the results of your imaging and laboratory studies on MyChart before your provider has had a chance to review them.  We understand that in some cases there may be results that are confusing or concerning to you. Not all laboratory results come back in the same time frame and the provider may be waiting for multiple results in order to interpret others.  Please give us 48 hours in order for your provider to thoroughly review all the results before contacting the office for clarification of your results.   The [facility] providers would like to encourage you to use MYCHART to communicate with providers for non-urgent requests or questions.  Due to long hold times on the telephone, sending your provider a message by MYCHART may be a faster and more efficient way to get a response.  Please allow 48 business hours for a response.  Please remember that this is for non-urgent requests.   Thank you for choosing me and [facility].  Malcolm T. Stark, Jr., MD., FACG  

## 2021-04-26 NOTE — Progress Notes (Signed)
    History of Present Illness: This is a 80 year old male with a history of GERD with short segment Barrett's esophagus and hematochezia.  Short segment Barrett's esophagus without dysplasia was identified on EGD in September 2016.  Repeat EGD in September 2019 showed endoscopic evidence of short segment Barrett's however biopsies showed inflammation without Barrett's.  He relates his reflux symptoms are under very good control.  He notes intermittent small-volume hematochezia with bowel movements.  He frequently strains with bowel movements.  He has had occasional bright red blood per rectum in between bowel movements as well.  He relates an episode of larger volume hematochezia about 1 year ago and he was evaluated at an urgent care in [place], [place].  The patient relates that a diverticular bleed was suspected.  The bleeding resolved without hospitalization and the larger volume rectal bleeding has not recurred.   Colonoscopy 01/2017 - Two 4 to 5 mm polyps in the transverse colon, removed with a cold snare. Resected and retrieved. (Hyperplastic and benign mucosa) - Internal hemorrhoids. - Moderate diverticulosis in the sigmoid colon, in the descending colon and in the transverse colon. There was no evidence of diverticular bleeding. - The examination was otherwise normal on direct and retroflexion views.   Current Medications, Allergies, Past Medical History, Past Surgical History, Family History and Social History were reviewed in [employer] record.   Physical Exam: General: Well developed, well nourished, no acute distress Head: Normocephalic and atraumatic Eyes: Sclerae anicteric, EOMI Ears: Normal auditory acuity Mouth: Not examined, mask on during Covid-19 pandemic Lungs: Clear throughout to auscultation Heart: Regular rate and rhythm; no murmurs, rubs or bruits Abdomen: Soft, non tender and non distended. No masses, hepatosplenomegaly or hernias noted. Normal  Bowel sounds Rectal: Not done Musculoskeletal: Symmetrical with no gross deformities  Pulses:  Normal pulses noted Extremities: No clubbing, cyanosis, edema or deformities noted Neurological: Alert oriented x 4, grossly nonfocal Psychological:  Alert and cooperative. Normal mood and affect   Assessment and Recommendations:  GERD.  History of short segment Barrett's esophagus without dysplasia.  Follow antireflux measures and continue omeprazole 40 mg p.o. twice daily.  We discussed continuing surveillance EGDs or discontinuing based on the last EGD not showing Barrett's and his age.  After discussion he indicated he wanted to proceed with at least 1 more EGD.  Schedule EGD. The risks (including bleeding, perforation, infection, missed lesions, medication reactions and possible hospitalization or surgery if complications occur), benefits, and alternatives to endoscopy with possible biopsy and possible dilation were discussed with the patient and they consent to proceed.   Hematochezia, constipation. Increase daily fiber and water intake. Begin Colace qd. Prep H suppositories PR daily as needed hemorrhoidal symptoms.  Schedule colonoscopy. The risks (including bleeding, perforation, infection, missed lesions, medication reactions and possible hospitalization or surgery if complications occur), benefits, and alternatives to colonoscopy with possible biopsy and possible polypectomy were discussed with the patient and they consent to proceed.   Personal history of adenomatous colon polyps.  Last colonoscopy performed in July 2018 was without precancerous polyps and no future surveillance colonoscopies were recommended.

## 2021-05-26 ENCOUNTER — Other Ambulatory Visit: Payer: Self-pay

## 2021-05-26 ENCOUNTER — Ambulatory Visit (AMBULATORY_SURGERY_CENTER): Payer: Medicare HMO | Admitting: Gastroenterology

## 2021-05-26 ENCOUNTER — Encounter: Payer: Self-pay | Admitting: Gastroenterology

## 2021-05-26 VITALS — BP 124/70 | HR 59 | Temp 97.3°F | Resp 14 | Ht 68.0 in | Wt 216.0 lb

## 2021-05-26 DIAGNOSIS — K219 Gastro-esophageal reflux disease without esophagitis: Secondary | ICD-10-CM | POA: Diagnosis not present

## 2021-05-26 DIAGNOSIS — D122 Benign neoplasm of ascending colon: Secondary | ICD-10-CM | POA: Diagnosis not present

## 2021-05-26 DIAGNOSIS — K921 Melena: Secondary | ICD-10-CM

## 2021-05-26 DIAGNOSIS — K2101 Gastro-esophageal reflux disease with esophagitis, with bleeding: Secondary | ICD-10-CM | POA: Diagnosis not present

## 2021-05-26 DIAGNOSIS — K5730 Diverticulosis of large intestine without perforation or abscess without bleeding: Secondary | ICD-10-CM | POA: Diagnosis not present

## 2021-05-26 DIAGNOSIS — K640 First degree hemorrhoids: Secondary | ICD-10-CM | POA: Diagnosis not present

## 2021-05-26 DIAGNOSIS — K2270 Barrett's esophagus without dysplasia: Secondary | ICD-10-CM

## 2021-05-26 MED ORDER — SODIUM CHLORIDE 0.9 % IV SOLN: 500.0000 mL | Freq: Once | INTRAVENOUS | Status: DC

## 2021-05-26 NOTE — Progress Notes (Signed)
Pt's states no medical or surgical changes since previsit or office visit. 

## 2021-05-26 NOTE — Patient Instructions (Signed)
High fiber diet Resume previous diet and medications No repeat Colonoscopy for surveillance due to age. Continue antireflux measures YOU HAD AN ENDOSCOPIC PROCEDURE TODAY AT [facility]:   Refer to the procedure report that was given to you for any specific questions about what was found during the examination.  If the procedure report does not answer your questions, please call your gastroenterologist to clarify.  If you requested that your care partner not be given the details of your procedure findings, then the procedure report has been included in a sealed envelope for you to review at your convenience later.  YOU SHOULD EXPECT: Some feelings of bloating in the abdomen. Passage of more gas than usual.  Walking can help get rid of the air that was put into your GI tract during the procedure and reduce the bloating. If you had a lower endoscopy (such as a colonoscopy or flexible sigmoidoscopy) you may notice spotting of blood in your stool or on the toilet paper. If you underwent a bowel prep for your procedure, you may not have a normal bowel movement for a few days.  Please Note:  You might notice some irritation and congestion in your nose or some drainage.  This is from the oxygen used during your procedure.  There is no need for concern and it should clear up in a day or so.  SYMPTOMS TO REPORT IMMEDIATELY:  Following lower endoscopy (colonoscopy or flexible sigmoidoscopy):  Excessive amounts of blood in the stool  Significant tenderness or worsening of abdominal pains  Swelling of the abdomen that is new, acute  Fever of 100F or higher  Following upper endoscopy (EGD)  Vomiting of blood or coffee ground material  New chest pain or pain under the shoulder blades  Painful or persistently difficult swallowing  New shortness of breath  Fever of 100F or higher  Black, tarry-looking stools  For urgent or emergent issues, a gastroenterologist can be reached at any hour  by calling [phone]. Do not use MyChart messaging for urgent concerns.    DIET:  We do recommend a small meal at first, but then you may proceed to your regular diet.  Drink plenty of fluids but you should avoid alcoholic beverages for 24 hours.  ACTIVITY:  You should plan to take it easy for the rest of today and you should NOT DRIVE or use heavy machinery until tomorrow (because of the sedation medicines used during the test).    FOLLOW UP: Our staff will call the number listed on your records 48-72 hours following your procedure to check on you and address any questions or concerns that you may have regarding the information given to you following your procedure. If we do not reach you, we will leave a message.  We will attempt to reach you two times.  During this call, we will ask if you have developed any symptoms of COVID 19. If you develop any symptoms (ie: fever, flu-like symptoms, shortness of breath, cough etc.) before then, please call [phone].  If you test positive for Covid 19 in the 2 weeks post procedure, please call and report this information to [place].    If any biopsies were taken you will be contacted by phone or by letter within the next 1-3 weeks.  Please call us at [phone] if you have not heard about the biopsies in 3 weeks.    SIGNATURES/CONFIDENTIALITY: You and/or your care partner have signed paperwork which will be entered into  your electronic medical record.  These signatures attest to the fact that that the information above on your After Visit Summary has been reviewed and is understood.  Full responsibility of the confidentiality of this discharge information lies with you and/or your care-partner.

## 2021-05-26 NOTE — Progress Notes (Signed)
Vss nad pt transferred to pacu

## 2021-05-26 NOTE — Op Note (Signed)
[facility] Patient Name: Adrian Burke Procedure Date: 05/26/2021 3:04 PM MRN: [id number] Endoscopist: Ladene Artist , MD Age: 80 Referring MD:  Date of Birth: 1941-05-04 Gender: Male Account #: [id number] Procedure:                Upper GI endoscopy Indications:              Surveillance for malignancy due to personal history                            of Barrett's esophagus Medicines:                Monitored Anesthesia Care Procedure:                Pre-Anesthesia Assessment:                           - Prior to the procedure, a History and Physical                            was performed, and patient medications and                            allergies were reviewed. The patient's tolerance of                            previous anesthesia was also reviewed. The risks                            and benefits of the procedure and the sedation                            options and risks were discussed with the patient.                            All questions were answered, and informed consent                            was obtained. Prior Anticoagulants: The patient has                            taken no previous anticoagulant or antiplatelet                            agents. ASA Grade Assessment: III - A patient with                            severe systemic disease. After reviewing the risks                            and benefits, the patient was deemed in                            satisfactory condition to undergo the procedure.  After obtaining informed consent, the endoscope was                            passed under direct vision. Throughout the                            procedure, the patient's blood pressure, pulse, and                            oxygen saturations were monitored continuously. The                            Endoscope was introduced through the mouth, and                            advanced to the second part  of duodenum. The upper                            GI endoscopy was accomplished without difficulty.                            The patient tolerated the procedure well. Scope In: Scope Out: Findings:                 There were esophageal mucosal changes secondary to                            established short-segment Barrett's disease present                            in the distal esophagus. The maximum longitudinal                            extent of these mucosal changes was 1 cm in length.                            Mucosa was biopsied with a cold forceps for                            histology in a targeted manner at intervals of 0.5                            cm at the gastroesophageal junction. One specimen                            bottle was sent to pathology.                           The exam of the esophagus was otherwise normal.                           A small hiatal hernia was present.  The exam of the stomach was otherwise normal.                           The duodenal bulb and second portion of the                            duodenum were normal. Complications:            No immediate complications. Estimated Blood Loss:     Estimated blood loss was minimal. Impression:               - Esophageal mucosal changes secondary to                            established short-segment Barrett's disease.                            Biopsied.                           - Small hiatal hernia.                           - Normal duodenal bulb and second portion of the                            duodenum. Recommendation:           - Patient has a contact number available for                            emergencies. The signs and symptoms of potential                            delayed complications were discussed with the                            patient. Return to normal activities tomorrow.                            Written discharge instructions were  provided to the                            patient.                           - Resume previous diet.                           - Follow antireflux measures long term.                           - Continue present medications.                           - Await pathology results.                           -  No repeat upper endoscopy for surveillance of                            Barrett's esophagus due to age. Ladene Artist, MD 05/26/2021 3:44:34 PM This report has been signed electronically.

## 2021-05-26 NOTE — Progress Notes (Signed)
See 04/26/2021 H&P, no changes.

## 2021-05-26 NOTE — Progress Notes (Signed)
Called to room to assist during endoscopic procedure.  Patient ID and intended procedure confirmed with present staff. Received instructions for my participation in the procedure from the performing physician.  

## 2021-05-26 NOTE — Progress Notes (Signed)
D.T. vital signs. °

## 2021-05-26 NOTE — Op Note (Signed)
[facility] Patient Name: Adrian Burke Procedure Date: 05/26/2021 3:04 PM MRN: [id number] Endoscopist: Ladene Artist , MD Age: 80 Referring MD:  Date of Birth: 08-18-1940 Gender: Male Account #: [id number] Procedure:                Colonoscopy Indications:              Hematochezia Medicines:                Monitored Anesthesia Care Procedure:                Pre-Anesthesia Assessment:                           - Prior to the procedure, a History and Physical                            was performed, and patient medications and                            allergies were reviewed. The patient's tolerance of                            previous anesthesia was also reviewed. The risks                            and benefits of the procedure and the sedation                            options and risks were discussed with the patient.                            All questions were answered, and informed consent                            was obtained. Prior Anticoagulants: The patient has                            taken no previous anticoagulant or antiplatelet                            agents. ASA Grade Assessment: III - A patient with                            severe systemic disease. After reviewing the risks                            and benefits, the patient was deemed in                            satisfactory condition to undergo the procedure.                           After obtaining informed consent, the colonoscope  was passed under direct vision. Throughout the                            procedure, the patient's blood pressure, pulse, and                            oxygen saturations were monitored continuously. The                            Olympus CF-HQ190L [id number]) Colonoscope was                            introduced through the anus and advanced to the the                            cecum, identified by appendiceal orifice and                             ileocecal valve. The ileocecal valve, appendiceal                            orifice, and rectum were photographed. The quality                            of the bowel preparation was good. The colonoscopy                            was performed without difficulty. The patient                            tolerated the procedure well. Scope In: 3:16:21 PM Scope Out: 3:29:08 PM Scope Withdrawal Time: 0 hours 11 minutes 11 seconds  Total Procedure Duration: 0 hours 12 minutes 47 seconds  Findings:                 The perianal and digital rectal examinations were                            normal.                           A 7 mm polyp was found in the ascending colon. The                            polyp was sessile. The polyp was removed with a                            cold snare. Resection and retrieval were complete.                           A 3 mm polyp was found in the ascending colon. The                            polyp was sessile. The polyp was removed  with a                            cold biopsy forceps. Resection and retrieval were                            complete.                           Multiple medium-mouthed diverticula were found in                            the sigmoid colon, descending colon and transverse                            colon. More concentrated in the sigmoid colon.                            There was no evidence of diverticular bleeding.                           Internal hemorrhoids were found during                            retroflexion. The hemorrhoids were moderate and                            Grade I (internal hemorrhoids that do not prolapse).                           The exam was otherwise without abnormality on                            direct and retroflexion views. Complications:            No immediate complications. Estimated blood loss:                            None. Estimated Blood Loss:      Estimated blood loss: none. Impression:               - One 7 mm polyp in the ascending colon, removed                            with a cold snare. Resected and retrieved.                           - One 3 mm polyp in the ascending colon, removed                            with a cold biopsy forceps. Resected and retrieved.                           - Moderate diverticulosis in the sigmoid colon, in  the descending colon and in the transverse colon.                           - Internal hemorrhoids.                           - The examination was otherwise normal on direct                            and retroflexion views. Recommendation:           - Patient has a contact number available for                            emergencies. The signs and symptoms of potential                            delayed complications were discussed with the                            patient. Return to normal activities tomorrow.                            Written discharge instructions were provided to the                            patient.                           - High fiber diet long term.                           - Continue present medications.                           - Await pathology results.                           - No repeat colonoscopy due to age. Ladene Artist, MD 05/26/2021 3:41:18 PM This report has been signed electronically.

## 2021-05-30 ENCOUNTER — Telehealth: Payer: Self-pay

## 2021-05-30 ENCOUNTER — Telehealth: Payer: Self-pay | Admitting: *Deleted

## 2021-05-30 NOTE — Telephone Encounter (Signed)
Left message on follow up call. 

## 2021-05-30 NOTE — Telephone Encounter (Signed)
Attempted f/u phone call. No answer. Left message. °

## 2021-06-01 NOTE — Telephone Encounter (Signed)
Left message on follow up cawll.

## 2021-06-01 NOTE — Telephone Encounter (Signed)
Left message on follow up call. 

## 2021-06-06 ENCOUNTER — Encounter: Payer: Self-pay | Admitting: Gastroenterology

## 2021-09-18 DIAGNOSIS — G8929 Other chronic pain: Secondary | ICD-10-CM | POA: Diagnosis not present

## 2021-09-18 DIAGNOSIS — I10 Essential (primary) hypertension: Secondary | ICD-10-CM | POA: Diagnosis not present

## 2021-09-18 DIAGNOSIS — E785 Hyperlipidemia, unspecified: Secondary | ICD-10-CM | POA: Diagnosis not present

## 2021-09-18 DIAGNOSIS — M1990 Unspecified osteoarthritis, unspecified site: Secondary | ICD-10-CM | POA: Diagnosis not present

## 2021-09-18 DIAGNOSIS — Z008 Encounter for other general examination: Secondary | ICD-10-CM | POA: Diagnosis not present

## 2021-09-18 DIAGNOSIS — Z6831 Body mass index (BMI) 31.0-31.9, adult: Secondary | ICD-10-CM | POA: Diagnosis not present

## 2021-09-18 DIAGNOSIS — E669 Obesity, unspecified: Secondary | ICD-10-CM | POA: Diagnosis not present

## 2021-09-18 DIAGNOSIS — J42 Unspecified chronic bronchitis: Secondary | ICD-10-CM | POA: Diagnosis not present

## 2021-09-18 DIAGNOSIS — Z7982 Long term (current) use of aspirin: Secondary | ICD-10-CM | POA: Diagnosis not present

## 2021-09-18 DIAGNOSIS — R32 Unspecified urinary incontinence: Secondary | ICD-10-CM | POA: Diagnosis not present

## 2021-09-18 DIAGNOSIS — K219 Gastro-esophageal reflux disease without esophagitis: Secondary | ICD-10-CM | POA: Diagnosis not present

## 2021-09-18 DIAGNOSIS — Z8249 Family history of ischemic heart disease and other diseases of the circulatory system: Secondary | ICD-10-CM | POA: Diagnosis not present

## 2022-01-11 ENCOUNTER — Other Ambulatory Visit: Payer: Self-pay | Admitting: Internal Medicine

## 2022-01-11 DIAGNOSIS — K2270 Barrett's esophagus without dysplasia: Secondary | ICD-10-CM

## 2022-01-11 NOTE — Telephone Encounter (Signed)
Please refill as per office routine med refill policy (all routine meds to be refilled for 3 mo or monthly (per pt preference) up to one year from last visit, then month to month grace period for 3 mo, then further med refills will have to be denied) ? ?

## 2022-02-01 ENCOUNTER — Other Ambulatory Visit: Payer: Self-pay | Admitting: Internal Medicine

## 2022-02-01 DIAGNOSIS — K2270 Barrett's esophagus without dysplasia: Secondary | ICD-10-CM

## 2022-02-01 NOTE — Telephone Encounter (Signed)
Please refill as per office routine med refill policy (all routine meds to be refilled for 3 mo or monthly (per pt preference) up to one year from last visit, then month to month grace period for 3 mo, then further med refills will have to be denied) ? ?

## 2022-02-17 ENCOUNTER — Other Ambulatory Visit: Payer: Self-pay | Admitting: Internal Medicine

## 2022-02-17 ENCOUNTER — Telehealth: Payer: Self-pay | Admitting: Internal Medicine

## 2022-02-17 DIAGNOSIS — K2270 Barrett's esophagus without dysplasia: Secondary | ICD-10-CM

## 2022-02-17 NOTE — Telephone Encounter (Signed)
Please refill as per office routine med refill policy (all routine meds to be refilled for 3 mo or monthly (per pt preference) up to one year from last visit, then month to month grace period for 3 mo, then further med refills will have to be denied) ? ?

## 2022-02-17 NOTE — Telephone Encounter (Signed)
Pt states he is out of  omeprazole (PRILOSEC) 40 MG capsule. Pt has appointment on 02/22/22 and would like to know if he can get a short supply until his upcoming appointment.    Please advise

## 2022-02-20 NOTE — Telephone Encounter (Signed)
Advised patient of our refill policy and that he will not be able to get refills until he is seen. Patient was unpleasant and expressed that once he seen 02/22/22 here, he and his wife will be transferring care elsewhere. Per patient, has been displeased with the level of care received from Dr. Jenny Reichmann.

## 2022-02-22 ENCOUNTER — Ambulatory Visit (INDEPENDENT_AMBULATORY_CARE_PROVIDER_SITE_OTHER): Payer: Medicare HMO | Admitting: Family Medicine

## 2022-02-22 ENCOUNTER — Encounter: Payer: Self-pay | Admitting: Family Medicine

## 2022-02-22 VITALS — BP 142/84 | HR 73 | Temp 97.6°F | Ht 68.0 in | Wt 210.0 lb

## 2022-02-22 DIAGNOSIS — K219 Gastro-esophageal reflux disease without esophagitis: Secondary | ICD-10-CM

## 2022-02-22 DIAGNOSIS — K2270 Barrett's esophagus without dysplasia: Secondary | ICD-10-CM

## 2022-02-22 MED ORDER — OMEPRAZOLE 40 MG PO CPDR: DELAYED_RELEASE_CAPSULE | ORAL | 2 refills | Status: DC

## 2022-02-22 NOTE — Progress Notes (Signed)
Subjective:     Patient ID: Adrian Burke, male    DOB: 1940/09/16, 81 y.o.   MRN: [id number]  Chief Complaint  Patient presents with   Medication Refill    Omeprazole     HPI Patient is in today for a medication refill.  States he is taking omeprazole 40 mg bid due to GERD and Barrett's esophagus.   States medication is working well.   Denies fever, chills, dizziness, chest pain, palpitations, shortness of breath, abdominal pain, N/V/D.      Health Maintenance Due  Topic Date Due   Zoster Vaccines- Shingrix (1 of 2) Never done   TETANUS/TDAP  05/17/2008    Past Medical History:  Diagnosis Date   Anemia    Anxiety    Arthritis    B12 deficiency    on monthly injections   Barrett's esophagus 03/2012   Cataract    Chest pain    2006 and 2011 ( neg nuclear stress test 2004) MI R/Oed   Chest pain at rest 3/28-29/14   Diverticulosis    Elevated homocysteine    Elevated PSA    due to prostatis   GERD (gastroesophageal reflux disease)    Headache(784.0)    spinal tap as OP    History of kidney stones    Hyperlipidemia    NMR panel 2005, LDL 125 (1309/534), HDL 38, TG 87. LDL Goal=<130. [place] study LDL Goald=<160.    Prostate nodule 2000   resolved w/o treatement; Dr Jeffie Pollock   Tubular adenoma of colon 06/2006   Dr Sharlett Iles   Vertigo 03/10/2018   Patient went to ED for vertigo    Past Surgical History:  Procedure Laterality Date   ABDOMINAL HERNIA REPAIR  11/10/2010   mesh   CATARACT EXTRACTION, BILATERAL     COLONOSCOPY W/ POLYPECTOMY  2001,2007,2013   F/U every 5 years; Dr Sharlett Iles   KNEE ARTHROSCOPY Left 09/22/2010   KNEE ARTHROSCOPY Right 01/02/2011   PROSTATE BIOPSY  2000   Dr.Wrenn   Rectal Fistula repair  07/2004   REPLACEMENT TOTAL KNEE Right 02/10/2011   ROTATOR CUFF REPAIR Right 2007   Dr.Jeff Beane   UPPER GI ENDOSCOPY  2013   S/P dilation    Family History  Problem Relation Age of Onset   Ovarian cancer Mother    Brain cancer  Maternal Aunt    Crohn's disease Other        grandson   Diabetes Neg Hx    Stroke Neg Hx    Heart disease Neg Hx    Colon cancer Neg Hx    Esophageal cancer Neg Hx    Stomach cancer Neg Hx    Pancreatic cancer Neg Hx    Kidney disease Neg Hx    Liver disease Neg Hx    Rectal cancer Neg Hx     Social History   Socioeconomic History   Marital status: Married    Spouse name: Not on file   Number of children: 3   Years of education: Not on file   Highest education level: Not on file  Occupational History   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 [employer]   Occupation: TRAFFIC    Employer: [employer]  Tobacco Use   Smoking status: Never   Smokeless tobacco: Never  Vaping Use   Vaping Use: Never used  Substance and Sexual Activity   Alcohol use: No    Alcohol/week: 0.0 standard drinks of alcohol   Drug use: No   Sexual  activity: Not on file  Other Topics Concern   Not on file  Social History Narrative   No diet   Exercise- irregulaly         Social Determinants of Health   Financial Resource Strain: Not on file  Food Insecurity: Not on file  Transportation Needs: Not on file  Physical Activity: Not on file  Stress: Not on file  Social Connections: Not on file  Intimate Partner Violence: Not on file    Outpatient Medications Prior to Visit  Medication Sig Dispense Refill   acetaminophen (TYLENOL) 650 MG CR tablet Take 1,300 mg by mouth 2 (two) times daily as needed for pain.     albuterol (VENTOLIN HFA) 108 (90 Base) MCG/ACT inhaler Inhale 2 puffs into the lungs every 6 (six) hours as needed for wheezing or shortness of breath. 1 each 11   CEPHALEXIN PO Take by mouth. Takes according to Dr. Randel Pigg instructions- 2 pills 1 hour before procedure, 1 pill 6 hours after procedure, then 1 pill 6 hours after that     Cholecalciferol (VITAMIN D3) 1000 UNITS CAPS Take 1,000 Units by mouth every morning.      cyanocobalamin (,VITAMIN B-12,) 1000 MCG/ML injection INJECT 1  ML INTO THE MUSCLE EVERY 30 DAYS. 12 mL 0   Multiple Vitamin (MULTIVITAMIN) capsule Take 1 capsule by mouth 2 (two) times daily.     NON FORMULARY Pain aid ESF: takes prn as needed for pain     OVER THE COUNTER MEDICATION Take 1 tablet by mouth daily. Memory tablet     TUBERCULIN SYR 1CC/27GX1/2" (B-D TB SYRINGE 1CC/27GX1/2") 27G X 1/2" 1 ML MISC USE MONTHLY TO GIVE B12 INJECTIONS 12 each 0   Zinc 50 MG CAPS Take by mouth.     omeprazole (PRILOSEC) 40 MG capsule Take 1 capsule by mouth twice daily 60 capsule 0   aspirin 81 MG EC tablet Take 81 mg by mouth every evening.     Vitamin Mixture (ESTER-C PO) Take by mouth daily.     No facility-administered medications prior to visit.    No Known Allergies  ROS     Objective:    Physical Exam  BP (!) 142/84 (BP Location: Left Arm, Patient Position: Sitting, Cuff Size: Large)   Pulse 73   Temp 97.6 F (36.4 C) (Temporal)   Ht '5\' 8"'$  (1.727 m)   Wt 210 lb (95.3 kg)   SpO2 98%   BMI 31.93 kg/m  Wt Readings from Last 3 Encounters:  02/22/22 210 lb (95.3 kg)  05/26/21 216 lb (98 kg)  04/26/21 216 lb 4 oz (98.1 kg)   Alert and oriented and in no acute distress. Respirations unlabored. Normal speech and mood.     Assessment & Plan:   Problem List Items Addressed This Visit       Digestive   Barrett's esophagus   Relevant Medications   omeprazole (PRILOSEC) 40 MG capsule   GERD - Primary   Relevant Medications   omeprazole (PRILOSEC) 40 MG capsule   Refilled omeprazole. Follow up with GI and PCP. Aware that he is overdue for labs and CPE.   I have discontinued [name] "Luanna Cole Iwanicki"'s aspirin EC and Vitamin Mixture (ESTER-C PO). I am also having him maintain his multivitamin, acetaminophen, Vitamin D3, OVER THE COUNTER MEDICATION, NON FORMULARY, CEPHALEXIN PO, Zinc, cyanocobalamin, TUBERCULIN SYR 1CC/27GX1/2", albuterol, and omeprazole.  Meds ordered this encounter  Medications   omeprazole (PRILOSEC) 40 MG  capsule  Sig: Take 1 capsule by mouth twice daily    Dispense:  60 capsule    Refill:  2    Order Specific Question:   Supervising Provider    Answer:   Pricilla Holm A [[id number]]

## 2022-02-22 NOTE — Patient Instructions (Addendum)
I have refilled your medication, omeprazole 40 mg twice daily.   Food Choices for Gastroesophageal Reflux Disease, Adult When you have gastroesophageal reflux disease (GERD), the foods you eat and your eating habits are very important. Choosing the right foods can help ease your discomfort. Think about working with a food expert (dietitian) to help you make good choices. What are tips for following this plan? Reading food labels Look for foods that are low in saturated fat. Foods that may help with your symptoms include: Foods that have less than 5% of daily value (DV) of fat. Foods that have 0 grams of trans fat. Cooking Do not fry your food. Cook your food by baking, steaming, grilling, or broiling. These are all methods that do not need a lot of fat for cooking. To add flavor, try to use herbs that are low in spice and acidity. Meal planning  Choose healthy foods that are low in fat, such as: Fruits and vegetables. Whole grains. Low-fat dairy products. Lean meats, fish, and poultry. Eat small meals often instead of eating 3 large meals each day. Eat your meals slowly in a place where you are relaxed. Avoid bending over or lying down until 2-3 hours after eating. Limit high-fat foods such as fatty meats or fried foods. Limit your intake of fatty foods, such as oils, butter, and shortening. Avoid the following as told by your doctor: Foods that cause symptoms. These may be different for different people. Keep a food diary to keep track of foods that cause symptoms. Alcohol. Drinking a lot of liquid with meals. Eating meals during the 2-3 hours before bed. Lifestyle Stay at a healthy weight. Ask your doctor what weight is healthy for you. If you need to lose weight, work with your doctor to do so safely. Exercise for at least 30 minutes on 5 or more days each week, or as told by your doctor. Wear loose-fitting clothes. Do not smoke or use any products that contain nicotine or tobacco.  If you need help quitting, ask your doctor. Sleep with the head of your bed higher than your feet. Use a wedge under the mattress or blocks under the bed frame to raise the head of the bed. Chew sugar-free gum after meals. What foods should eat?  Eat a healthy, well-balanced diet of fruits, vegetables, whole grains, low-fat dairy products, lean meats, fish, and poultry. Each person is different. Foods that may cause symptoms in one person may not cause any symptoms in another person. Work with your doctor to find foods that are safe for you. The items listed above may not be a complete list of what you can eat and drink. Contact a food expert for more options. What foods should I avoid? Limiting some of these foods may help in managing the symptoms of GERD. Everyone is different. Talk with a food expert or your doctor to help you find the exact foods to avoid, if any. Fruits Any fruits prepared with added fat. Any fruits that cause symptoms. For some people, this may include citrus fruits, such as oranges, grapefruit, pineapple, and lemons. Vegetables Deep-fried vegetables. [place] fries. Any vegetables prepared with added fat. Any vegetables that cause symptoms. For some people, this may include tomatoes and tomato products, chili peppers, onions and garlic, and horseradish. Grains Pastries or quick breads with added fat. Meats and other proteins High-fat meats, such as fatty beef or pork, hot dogs, ribs, ham, sausage, salami, and bacon. Fried meat or protein, including fried fish  and fried chicken. Nuts and nut butters, in large amounts. Dairy Whole milk and chocolate milk. Sour cream. Cream. Ice cream. Cream cheese. Milkshakes. Fats and oils Butter. Margarine. Shortening. Ghee. Beverages Coffee and tea, with or without caffeine. Carbonated beverages. Sodas. Energy drinks. Fruit juice made with acidic fruits, such as orange or grapefruit. Tomato juice. Alcoholic drinks. Sweets and  desserts Chocolate and cocoa. Donuts. Seasonings and condiments Pepper. Peppermint and spearmint. Added salt. Any condiments, herbs, or seasonings that cause symptoms. For some people, this may include curry, hot sauce, or vinegar-based salad dressings. The items listed above may not be a complete list of what you should not eat and drink. Contact a food expert for more options. Questions to ask your doctor Diet and lifestyle changes are often the first steps that are taken to manage symptoms of GERD. If diet and lifestyle changes do not help, talk with your doctor about taking medicines. Where to find more information International Foundation for Gastrointestinal Disorders: aboutgerd.org Summary When you have GERD, food and lifestyle choices are very important in easing your symptoms. Eat small meals often instead of 3 large meals a day. Eat your meals slowly and in a place where you are relaxed. Avoid bending over or lying down until 2-3 hours after eating. Limit high-fat foods such as fatty meats or fried foods. This information is not intended to replace advice given to you by your health care provider. Make sure you discuss any questions you have with your health care provider. Document Revised: 01/12/2020 Document Reviewed: 01/12/2020 Elsevier Patient Education  [place].

## 2022-05-17 DIAGNOSIS — Z13228 Encounter for screening for other metabolic disorders: Secondary | ICD-10-CM | POA: Diagnosis not present

## 2022-05-17 DIAGNOSIS — Z0000 Encounter for general adult medical examination without abnormal findings: Secondary | ICD-10-CM | POA: Diagnosis not present

## 2022-05-17 DIAGNOSIS — Z79899 Other long term (current) drug therapy: Secondary | ICD-10-CM | POA: Diagnosis not present

## 2022-05-17 DIAGNOSIS — R972 Elevated prostate specific antigen [PSA]: Secondary | ICD-10-CM | POA: Diagnosis not present

## 2022-05-28 DIAGNOSIS — Z125 Encounter for screening for malignant neoplasm of prostate: Secondary | ICD-10-CM | POA: Diagnosis not present

## 2022-05-28 DIAGNOSIS — T148XXA Other injury of unspecified body region, initial encounter: Secondary | ICD-10-CM | POA: Diagnosis not present

## 2022-05-28 DIAGNOSIS — I10 Essential (primary) hypertension: Secondary | ICD-10-CM | POA: Diagnosis not present

## 2022-05-28 DIAGNOSIS — E785 Hyperlipidemia, unspecified: Secondary | ICD-10-CM | POA: Diagnosis not present

## 2022-05-30 DIAGNOSIS — Z125 Encounter for screening for malignant neoplasm of prostate: Secondary | ICD-10-CM | POA: Diagnosis not present

## 2022-08-24 DIAGNOSIS — H9192 Unspecified hearing loss, left ear: Secondary | ICD-10-CM | POA: Diagnosis not present

## 2022-08-24 DIAGNOSIS — R5383 Other fatigue: Secondary | ICD-10-CM | POA: Diagnosis not present

## 2022-08-24 DIAGNOSIS — E119 Type 2 diabetes mellitus without complications: Secondary | ICD-10-CM | POA: Diagnosis not present

## 2022-08-24 DIAGNOSIS — E538 Deficiency of other specified B group vitamins: Secondary | ICD-10-CM | POA: Diagnosis not present

## 2022-08-24 DIAGNOSIS — I10 Essential (primary) hypertension: Secondary | ICD-10-CM | POA: Diagnosis not present

## 2022-08-24 DIAGNOSIS — E785 Hyperlipidemia, unspecified: Secondary | ICD-10-CM | POA: Diagnosis not present

## 2022-09-05 DIAGNOSIS — J209 Acute bronchitis, unspecified: Secondary | ICD-10-CM | POA: Diagnosis not present

## 2022-09-05 DIAGNOSIS — R030 Elevated blood-pressure reading, without diagnosis of hypertension: Secondary | ICD-10-CM | POA: Diagnosis not present

## 2022-11-29 DIAGNOSIS — E11630 Type 2 diabetes mellitus with periodontal disease: Secondary | ICD-10-CM | POA: Diagnosis not present

## 2022-11-29 DIAGNOSIS — Z79899 Other long term (current) drug therapy: Secondary | ICD-10-CM | POA: Diagnosis not present

## 2022-11-29 DIAGNOSIS — E669 Obesity, unspecified: Secondary | ICD-10-CM | POA: Diagnosis not present

## 2022-11-29 DIAGNOSIS — R32 Unspecified urinary incontinence: Secondary | ICD-10-CM | POA: Diagnosis not present

## 2022-11-29 DIAGNOSIS — J4489 Other specified chronic obstructive pulmonary disease: Secondary | ICD-10-CM | POA: Diagnosis not present

## 2022-11-29 DIAGNOSIS — N182 Chronic kidney disease, stage 2 (mild): Secondary | ICD-10-CM | POA: Diagnosis not present

## 2022-11-29 DIAGNOSIS — M1990 Unspecified osteoarthritis, unspecified site: Secondary | ICD-10-CM | POA: Diagnosis not present

## 2022-11-29 DIAGNOSIS — E785 Hyperlipidemia, unspecified: Secondary | ICD-10-CM | POA: Diagnosis not present

## 2022-11-29 DIAGNOSIS — I129 Hypertensive chronic kidney disease with stage 1 through stage 4 chronic kidney disease, or unspecified chronic kidney disease: Secondary | ICD-10-CM | POA: Diagnosis not present

## 2022-11-29 DIAGNOSIS — E1122 Type 2 diabetes mellitus with diabetic chronic kidney disease: Secondary | ICD-10-CM | POA: Diagnosis not present

## 2022-11-29 DIAGNOSIS — Z008 Encounter for other general examination: Secondary | ICD-10-CM | POA: Diagnosis not present

## 2022-11-29 DIAGNOSIS — K219 Gastro-esophageal reflux disease without esophagitis: Secondary | ICD-10-CM | POA: Diagnosis not present

## 2022-11-29 DIAGNOSIS — K2270 Barrett's esophagus without dysplasia: Secondary | ICD-10-CM | POA: Diagnosis not present

## 2023-07-30 ENCOUNTER — Telehealth: Payer: Self-pay

## 2023-07-30 NOTE — Telephone Encounter (Signed)
 Ok with me

## 2023-07-30 NOTE — Telephone Encounter (Signed)
 Pt would like TOC from Dr Oliver Barre to Dr Ruthine Dose. Please advise.

## 2023-08-03 DIAGNOSIS — Z008 Encounter for other general examination: Secondary | ICD-10-CM | POA: Diagnosis not present

## 2023-09-06 ENCOUNTER — Ambulatory Visit: Payer: Medicare HMO | Admitting: Family Medicine

## 2023-09-06 ENCOUNTER — Encounter: Payer: Self-pay | Admitting: Family Medicine

## 2023-09-06 VITALS — BP 130/82 | HR 76 | Temp 98.1°F | Resp 16 | Ht 68.0 in | Wt 183.0 lb

## 2023-09-06 DIAGNOSIS — R972 Elevated prostate specific antigen [PSA]: Secondary | ICD-10-CM

## 2023-09-06 DIAGNOSIS — K219 Gastro-esophageal reflux disease without esophagitis: Secondary | ICD-10-CM

## 2023-09-06 DIAGNOSIS — R7303 Prediabetes: Secondary | ICD-10-CM | POA: Diagnosis not present

## 2023-09-06 DIAGNOSIS — E538 Deficiency of other specified B group vitamins: Secondary | ICD-10-CM

## 2023-09-06 DIAGNOSIS — E7800 Pure hypercholesterolemia, unspecified: Secondary | ICD-10-CM | POA: Diagnosis not present

## 2023-09-06 DIAGNOSIS — K2270 Barrett's esophagus without dysplasia: Secondary | ICD-10-CM

## 2023-09-06 LAB — CBC WITH DIFFERENTIAL/PLATELET
Basophils Absolute: 0.1 10*3/uL (ref 0.0–0.1)
Basophils Relative: 1.3 % (ref 0.0–3.0)
Eosinophils Absolute: 0.4 10*3/uL (ref 0.0–0.7)
Eosinophils Relative: 8.2 % — ABNORMAL HIGH (ref 0.0–5.0)
HCT: 41.2 % (ref 39.0–52.0)
Hemoglobin: 13.7 g/dL (ref 13.0–17.0)
Lymphocytes Relative: 31.2 % (ref 12.0–46.0)
Lymphs Abs: 1.5 10*3/uL (ref 0.7–4.0)
MCHC: 33.2 g/dL (ref 30.0–36.0)
MCV: 91.7 fL (ref 78.0–100.0)
Monocytes Absolute: 0.3 10*3/uL (ref 0.1–1.0)
Monocytes Relative: 7.1 % (ref 3.0–12.0)
Neutro Abs: 2.5 10*3/uL (ref 1.4–7.7)
Neutrophils Relative %: 52.2 % (ref 43.0–77.0)
Platelets: 235 10*3/uL (ref 150.0–400.0)
RBC: 4.5 Mil/uL (ref 4.22–5.81)
RDW: 13.9 % (ref 11.5–15.5)
WBC: 4.9 10*3/uL (ref 4.0–10.5)

## 2023-09-06 LAB — COMPREHENSIVE METABOLIC PANEL
ALT: 12 U/L (ref 0–53)
AST: 19 U/L (ref 0–37)
Albumin: 4.2 g/dL (ref 3.5–5.2)
Alkaline Phosphatase: 74 U/L (ref 39–117)
BUN: 10 mg/dL (ref 6–23)
CO2: 28 meq/L (ref 19–32)
Calcium: 8.9 mg/dL (ref 8.4–10.5)
Chloride: 106 meq/L (ref 96–112)
Creatinine, Ser: 0.83 mg/dL (ref 0.40–1.50)
GFR: 81.71 mL/min (ref >60.00)
Glucose, Bld: 88 mg/dL (ref 70–99)
Potassium: 4 meq/L (ref 3.5–5.1)
Sodium: 142 meq/L (ref 135–145)
Total Bilirubin: 0.8 mg/dL (ref 0.2–1.2)
Total Protein: 6.7 g/dL (ref 6.0–8.3)

## 2023-09-06 LAB — LIPID PANEL
Cholesterol: 152 mg/dL (ref 0–200)
HDL: 51.3 mg/dL (ref >39.00)
LDL Cholesterol: 80 mg/dL (ref 0–99)
NonHDL: 100.73
Total CHOL/HDL Ratio: 3
Triglycerides: 106 mg/dL (ref 0.0–149.0)
VLDL: 21.2 mg/dL (ref 0.0–40.0)

## 2023-09-06 LAB — TSH: TSH: 1.41 u[IU]/mL (ref 0.35–5.50)

## 2023-09-06 LAB — PSA: PSA: 1.05 ng/mL (ref 0.10–4.00)

## 2023-09-06 LAB — HEMOGLOBIN A1C: Hgb A1c MFr Bld: 5.6 % (ref 4.6–6.5)

## 2023-09-06 LAB — VITAMIN B12: Vitamin B-12: 511 pg/mL (ref 211–911)

## 2023-09-06 NOTE — Assessment & Plan Note (Signed)
Chronic.  Controlled.  Work on [employer]

## 2023-09-06 NOTE — Assessment & Plan Note (Signed)
Chronic.  Was on injections but then level too high so on PO now

## 2023-09-06 NOTE — Assessment & Plan Note (Signed)
Chronic.  Has seen urol in past.  Check PSA

## 2023-09-06 NOTE — Assessment & Plan Note (Signed)
 Chronic.  Controlled.  Continue omeprazole 40mg  bid

## 2023-09-06 NOTE — Patient Instructions (Signed)
 Welcome to [facility]! It was a pleasure meeting you today.  As discussed, Please schedule a 6 month follow up visit today.  PLEASE NOTE:  If you had any LAB tests please let us know if you have not heard back within a few days. You may see your results on MyChart before we have a chance to review them but we will give you a call once they are reviewed by [place]. If we ordered any REFERRALS today, please let us know if you have not heard from their office within the next week.  Let us know through MyChart if you are needing REFILLS, or have your pharmacy send [place] the request. You can also use MyChart to communicate with me or any office staff.  Please try these tips to maintain a healthy lifestyle:  Eat most of your calories during the day when you are active. Eliminate processed foods including packaged sweets (pies, cakes, cookies), reduce intake of potatoes, white bread, white pasta, and white rice. Look for whole grain options, oat flour or almond flour.  Each meal should contain half fruits/vegetables, one quarter protein, and one quarter carbs (no bigger than a computer mouse).  Cut down on sweet beverages. This includes juice, soda, and sweet tea. Also watch fruit intake, though this is a healthier sweet option, it still contains natural sugar! Limit to 3 servings daily.  Drink at least 1 glass of water with each meal and aim for at least 8 glasses per day  Exercise at least 150 minutes every week.

## 2023-09-06 NOTE — Assessment & Plan Note (Signed)
Chronic.  Controlled on omeprazole 40mg  bid.

## 2023-09-06 NOTE — Progress Notes (Signed)
New Patient Office Visit  Subjective:  Patient ID: Adrian Burke, male    DOB: 1940/12/13  Age: 83 y.o. MRN: [id number]  CC:  Chief Complaint  Patient presents with   Transfer of Care    Transfer of Care to new pcp Fasting     HPI Adrian Burke presents for new pt Discussed the use of AI scribe software for clinical note transcription with the patient, who gave verbal consent to proceed.  History of Present Illness   Adrian Burke is an 83 year old male with Barrett's esophagus, elevated PSA, and B12 deficiency who presents for a consultation and blood work update.  He has a history of Barrett's esophagus related to gastroesophageal reflux disease (GERD) and takes omeprazole 40 mg twice daily to manage his symptoms. He experiences occasional dysphagia, sometimes choking on saliva, and food getting stuck due to GERD. He has had polyps removed in the past but has been advised against further endoscopies or colonoscopies due to his age.  He has an elevated PSA but has not been diagnosed with prostate cancer. His PSA levels have decreased recently. He has had a prostate biopsy in the past and reports no current issues with urination, although he sometimes feels urgency. He has reduced his tea intake, which has helped with urinary frequency.  He has a B12 deficiency and was previously on injections but is now taking oral B12 tablets. His levels were reduced from 1300 to below 400, and he is currently working to maintain them around 500.  He takes rosuvastatin 10mg  for high cholesterol and is due for a recheck of his levels.    He has a history of prediabetes but has not progressed to diabetes.  He experiences chronic low back pain due to multilevel degenerative disc disease, which was confirmed by an X-ray. He manages the pain with topical creams and positioning during sleep, and he has not pursued physical therapy recently.  He uses albuterol as needed but has not used it  since it was prescribed in 2022. He also takes vitamin D, a multivitamin, and Tylenol as needed. He is not currently taking olmesartan for blood pressure.  His surgical history includes right rotator cuff repair, right knee replacement, prostate biopsy, rectal fistula repair, knee scopes, cataract surgery, and hernia repair.         Current Outpatient Medications:    acetaminophen (TYLENOL) 650 MG CR tablet, Take 1,300 mg by mouth 2 (two) times daily as needed for pain., Disp: , Rfl:    albuterol (VENTOLIN HFA) 108 (90 Base) MCG/ACT inhaler, Inhale 2 puffs into the lungs every 6 (six) hours as needed for wheezing or shortness of breath., Disp: 1 each, Rfl: 11   Cholecalciferol (VITAMIN D3) 1000 UNITS CAPS, Take 1,000 Units by mouth every morning. , Disp: , Rfl:    diclofenac Sodium (VOLTAREN) 1 % GEL, Apply topically., Disp: , Rfl:    lidocaine (LIDODERM) 5 %, Place onto the skin., Disp: , Rfl:    Multiple Vitamin (MULTIVITAMIN) capsule, Take 1 capsule by mouth daily., Disp: , Rfl:    omeprazole (PRILOSEC) 40 MG capsule, Take 1 capsule by mouth twice daily, Disp: 60 capsule, Rfl: 2   rosuvastatin (CRESTOR) 10 MG tablet, Take 10 mg by mouth daily., Disp: , Rfl:    Zinc 50 MG CAPS, Take by mouth., Disp: , Rfl:   Past Medical History:  Diagnosis Date   Anemia    Anxiety    Arthritis  B12 deficiency    on monthly injections   Barrett's esophagus 03/2012   Cataract    Chest pain    2006 and 2011 ( neg nuclear stress test 2004) MI R/Oed   Chest pain at rest 3/28-29/14   Diverticulosis    Elevated homocysteine    Elevated PSA    due to prostatis   GERD (gastroesophageal reflux disease)    Headache(784.0)    spinal tap as OP    History of kidney stones    Hyperlipidemia    NMR panel 2005, LDL 125 (1309/534), HDL 38, TG 87. LDL Goal=<130. [place] study LDL Goald=<160.    Prostate nodule 2000   resolved w/o treatement; Dr Annabell Howells   Tubular adenoma of colon 06/2006   Dr Jarold Motto    Vertigo 03/10/2018   Patient went to ED for vertigo    Past Surgical History:  Procedure Laterality Date   ABDOMINAL HERNIA REPAIR  11/10/2010   mesh   CATARACT EXTRACTION, BILATERAL     COLONOSCOPY W/ POLYPECTOMY  2001,2007,2013   F/U every 5 years; Dr Jarold Motto   KNEE ARTHROSCOPY Left 09/22/2010   KNEE ARTHROSCOPY Right 01/02/2011   PROSTATE BIOPSY  2000   Dr.Wrenn   Rectal Fistula repair  07/2004   REPLACEMENT TOTAL KNEE Right 02/10/2011   ROTATOR CUFF REPAIR Right 2007   Dr.Jeff Beane   UPPER GI ENDOSCOPY  2013   S/P dilation    Family History  Problem Relation Age of Onset   Ovarian cancer Mother    Brain cancer Maternal Aunt    Crohn's disease Other        grandson   Diabetes Neg Hx    Stroke Neg Hx    Heart disease Neg Hx    Colon cancer Neg Hx    Esophageal cancer Neg Hx    Stomach cancer Neg Hx    Pancreatic cancer Neg Hx    Kidney disease Neg Hx    Liver disease Neg Hx    Rectal cancer Neg Hx     Social History   Socioeconomic History   Marital status: Married    Spouse name: Not on file   Number of children: 3   Years of education: Not on file   Highest education level: Not on file  Occupational History   Occupation: Social worker for [employer]   Occupation: TRAFFIC    Employer: [employer]  Tobacco Use   Smoking status: Never   Smokeless tobacco: Never  Vaping Use   Vaping status: Never Used  Substance and Sexual Activity   Alcohol use: No    Alcohol/week: 0.0 standard drinks of alcohol   Drug use: No   Sexual activity: Not on file  Other Topics Concern   Not on file  Social History Narrative   No dietExercise- irregulaly   Grands 6 greats 3   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    [place] missile crisis.  Harlene Salts pick up   Social Drivers of Health   Financial Resource Strain: Not on [employer] Insecurity: Not on file  Transportation Needs: Not on file  Physical Activity: Not on file  Stress: Not on file  Social Connections: Not  on file  Intimate Partner Violence: Not on file    ROS  ROS: Gen: no fever, chills  Skin: no rash, itching ENT: no ear pain, ear drainage, nasal congestion, rhinorrhea, sinus pressure, sore throat Eyes: no blurry vision, double vision Resp: no cough, wheeze,SOB CV:  no CP, palpitations, LE edema,  GI: no heartburn, n/v/d/c, abd pain.  Can choke easily and occ dysphagia GU: no dysuria,  frequency, hematuria,  some urgency at times. MSK: chronic. Lbp-creams, etc. Doing ok Neuro: no dizziness, headache, weakness, vertigo Psych: no depression, anxiety, insomnia, SI   Objective:   Today's Vitals: BP 130/82   Pulse 76   Temp 98.1 F (36.7 C) (Temporal)   Resp 16   Ht 5\' 8"  (1.727 m)   Wt 183 lb (83 kg)   SpO2 97%   BMI 27.83 kg/m   Physical Exam  Gen: WDWN NAD HEENT: NCAT, conjunctiva not injected, sclera nonicteric TM WNL B, OP moist, no exudates  NECK:  supple, no thyromegaly, no nodes, no carotid bruits CARDIAC: RRR, S1S2+, no murmur. DP 2+B LUNGS: CTAB. No wheezes ABDOMEN:  BS+, soft, NTND, No HSM, no masses EXT:  no edema MSK: no gross abnormalities.  NEURO: A&O x3.  CN II-XII intact.  PSYCH: normal mood. Good eye contact   Assessment & Plan:  Pure hypercholesterolemia Assessment & Plan: Chronic.  Not sure of control. Continue crestor 10mg -will call when needs refill  Orders: -     Lipid panel -     Comprehensive metabolic panel -     TSH  Prediabetes Assessment & Plan: Chronic.  Controlled.  Work on diet/exerise  Orders: -     Comprehensive metabolic panel -     Hemoglobin A1c -     TSH  Barrett's esophagus without dysplasia Assessment & Plan: Chronic.  Controlled on omeprazole 40mg  bid.     Gastroesophageal reflux disease without esophagitis Assessment & Plan: Chronic.  Controlled.  Continue omeprazole 40mg  bid   Elevated PSA Assessment & Plan: Chronic.  Has seen urol in past.  Check PSA  Orders: -     PSA  B12 deficiency Assessment &  Plan: Chronic.  Was on injections but then level too high so on PO now  Orders: -     CBC with Differential/Platelet -     Vitamin B12  Assessment and Plan    Barrett's Esophagus Barrett's esophagus is secondary to GERD, with reports of dysphagia and occasional choking on saliva. Currently taking omeprazole 40 mg BID. Discussed the risks and benefits of continued endoscopies due to age and the presence of polyps, which could become malignant. There is concern about not removing polyps. Continue omeprazole 40 mg BID and consult GI regarding continued endoscopies despite age-related risks.  Chronic Low Back Pain Chronic low back pain is due to multilevel degenerative disc disease, managed with topical cream and positional adjustments. No recent physical therapy as it was declined in favor of current management. Continue topical cream and positional adjustments. Consider PT if symptoms worsen.  Elevated PSA Elevated PSA has been previously monitored with no current symptoms of prostate cancer. Last PSA levels were reduced. Emphasized the importance of monitoring PSA levels for early detection. Order PSA test.  Hyperlipidemia Hyperlipidemia is managed with rosuvastatin 10mg . No recent lipid panel is available. Discussed the importance of monitoring lipid levels to manage cardiovascular risk. Order lipid panel and continue rosuvastatin 10 mg daily.  Vitamin B12 Deficiency Vitamin B12 deficiency is managed with oral supplementation. Last levels were within an acceptable range. Discussed the benefits of continuing oral supplementation and monitoring levels. Continue B12 tablets and order B12 level test.  General Health Maintenance As a new patient establishing care, there is no history of heart disease or stroke, and no smoking, alcohol, or drug  use. Former Freight forwarder. Discussed the importance of regular follow-ups and comprehensive blood work to monitor overall health. Order comprehensive  blood work and schedule a follow-up in six months for routine monitoring and lab review.  Follow-up Schedule a follow-up appointment in six months. Contact the office if any issues arise before the next visit.        Follow-up: Return in about 6 months (around 03/05/2024) for cholesterol, prostate.   Angelena Sole, MD

## 2023-09-06 NOTE — Assessment & Plan Note (Signed)
Chronic.  Not sure of control. Continue crestor 10mg -will call when needs refill

## 2023-09-08 ENCOUNTER — Encounter: Payer: Self-pay | Admitting: Family Medicine

## 2023-09-08 NOTE — Progress Notes (Signed)
 Labs look great.  Possibly some allergy(eos elevated).

## 2023-10-18 ENCOUNTER — Telehealth: Payer: Self-pay | Admitting: *Deleted

## 2023-10-18 NOTE — Telephone Encounter (Signed)
 Copied from CRM [id number]. Topic: General - Other >> Oct 18, 2023  4:50 PM Cammy Copa D wrote: Reason for CRM: PT said he received a VM from Clydie Braun in regards to scheduling his AWV. PT wanted to leave a message that he has already done his AWV in-home through [employer] for the year of 2025.

## 2023-10-30 ENCOUNTER — Other Ambulatory Visit: Payer: Self-pay | Admitting: Family Medicine

## 2023-10-30 MED ORDER — ROSUVASTATIN CALCIUM 10 MG PO TABS: 10.0000 mg | ORAL_TABLET | Freq: Every day | ORAL | 0 refills | Status: DC

## 2023-10-30 NOTE — Telephone Encounter (Signed)
 Copied from CRM [id number]. Topic: Clinical - Medication Refill >> Oct 30, 2023  4:15 PM Chuck Crater wrote: Most Recent Primary Care Visit:  Provider: Waldo Guitar, ANN MARIE  Department: [facility]  Visit Type: TRANSFER OF CARE  Date: 09/06/2023  Medication: rosuvastatin (CRESTOR) 10 MG tablet   Has the patient contacted their pharmacy? No (Agent: If no, request that the patient contact the pharmacy for the refill. If patient does not wish to contact the pharmacy document the reason why and proceed with request.) (Agent: If yes, when and what did the pharmacy advise?) switched from [place] to new doctor   Is this the correct pharmacy for this prescription? Yes If no, delete pharmacy and type the correct one.  This is the patient's preferred pharmacy:  [facility] [address] [place] - [address] [place] [address] Phone: [phone] Fax: [phone]   Has the prescription been filled recently? No  Is the patient out of the medication? Yes  Has the patient been seen for an appointment in the last year OR does the patient have an upcoming appointment? Yes  Can we respond through MyChart? Yes  Agent: Please be advised that Rx refills may take up to 3 business days. We ask that you follow-up with your pharmacy.

## 2023-11-01 ENCOUNTER — Other Ambulatory Visit: Payer: Self-pay | Admitting: Family Medicine

## 2023-11-01 NOTE — Telephone Encounter (Signed)
 Copied from CRM [id number]. Topic: Clinical - Medication Refill >> Nov 01, 2023  3:14 PM Aisha D wrote: Most Recent Primary Care Visit:  Provider: Waldo Guitar, ANN MARIE  Department: [facility]  Visit Type: TRANSFER OF CARE  Date: 09/06/2023  Medication: rosuvastatin (CRESTOR) 10 MG tablet  Has the patient contacted their pharmacy? Yes (Agent: If no, request that the patient contact the pharmacy for the refill. If patient does not wish to contact the pharmacy document the reason why and proceed with request.) (Agent: If yes, when and what did the pharmacy advise?)  Is this the correct pharmacy for this prescription? Yes If no, delete pharmacy and type the correct one.  This is the patient's preferred pharmacy:  [facility] [address] [place] - [address] [place] [address] Phone: [phone] Fax: [phone]   Has the prescription been filled recently? No  Is the patient out of the medication? Yes  Has the patient been seen for an appointment in the last year OR does the patient have an upcoming appointment? Yes  Can we respond through MyChart? Yes  Agent: Please be advised that Rx refills may take up to 3 business days. We ask that you follow-up with your pharmacy.

## 2023-11-01 NOTE — Telephone Encounter (Signed)
 Disp Refills Start End   rosuvastatin (CRESTOR) 10 MG tablet 90 tablet 0 10/30/2023 --   Sig - Route: Take 1 tablet (10 mg total) by mouth daily. - Oral   Sent to pharmacy as: rosuvastatin (CRESTOR) 10 MG tablet   E-Prescribing Status: Receipt confirmed by pharmacy (10/30/2023  4:54 PM EDT)     Medication sent to pharmacy on 10/30/23. Duplicate request. Closing encounter. Please direct patient to contact his pharmacy.

## 2023-11-05 ENCOUNTER — Other Ambulatory Visit: Payer: Self-pay | Admitting: Family Medicine

## 2023-11-05 DIAGNOSIS — K2270 Barrett's esophagus without dysplasia: Secondary | ICD-10-CM

## 2023-11-05 DIAGNOSIS — K219 Gastro-esophageal reflux disease without esophagitis: Secondary | ICD-10-CM

## 2023-11-05 NOTE — Telephone Encounter (Signed)
 Copied from CRM [id number]. Topic: Clinical - Medication Refill >> Nov 05, 2023  5:02 PM [place] J wrote: Most Recent Primary Care Visit:  Provider: Waldo Guitar, ANN MARIE  Department: [facility]  Visit Type: TRANSFER OF CARE  Date: 09/06/2023  Medication: omeprazole  (PRILOSEC) 40 MG capsule  Has the patient contacted their pharmacy? Yes (Agent: If no, request that the patient contact the pharmacy for the refill. If patient does not wish to contact the pharmacy document the reason why and proceed with request.) (Agent: If yes, when and what did the pharmacy advise?)  Is this the correct pharmacy for this prescription? Yes If no, delete pharmacy and type the correct one.  This is the patient's preferred pharmacy:  [facility] [address], [place] - [address] [place] [address] Phone: [phone] Fax: [phone]   Has the prescription been filled recently? No  Is the patient out of the medication? No  Has the patient been seen for an appointment in the last year OR does the patient have an upcoming appointment? Yes  Can we respond through MyChart? Yes  Agent: Please be advised that Rx refills may take up to 3 business days. We ask that you follow-up with your pharmacy.

## 2023-11-06 NOTE — Telephone Encounter (Signed)
 Last Fill: 02/22/22  Last OV: 09/06/23 Next OV: 03/05/24  Routing to provider for review/authorization.

## 2023-11-07 MED ORDER — OMEPRAZOLE 40 MG PO CPDR: DELAYED_RELEASE_CAPSULE | ORAL | 2 refills | Status: DC

## 2024-01-22 ENCOUNTER — Other Ambulatory Visit: Payer: Self-pay | Admitting: Family Medicine

## 2024-01-22 DIAGNOSIS — K219 Gastro-esophageal reflux disease without esophagitis: Secondary | ICD-10-CM

## 2024-01-22 DIAGNOSIS — K2270 Barrett's esophagus without dysplasia: Secondary | ICD-10-CM

## 2024-02-19 ENCOUNTER — Other Ambulatory Visit: Payer: Self-pay | Admitting: Family Medicine

## 2024-03-05 ENCOUNTER — Ambulatory Visit (INDEPENDENT_AMBULATORY_CARE_PROVIDER_SITE_OTHER): Payer: Medicare HMO | Admitting: Family Medicine

## 2024-03-05 ENCOUNTER — Encounter: Payer: Self-pay | Admitting: Family Medicine

## 2024-03-05 VITALS — BP 127/75 | HR 66 | Temp 97.5°F | Resp 18 | Ht 68.0 in | Wt 180.1 lb

## 2024-03-05 DIAGNOSIS — E7800 Pure hypercholesterolemia, unspecified: Secondary | ICD-10-CM

## 2024-03-05 DIAGNOSIS — R7303 Prediabetes: Secondary | ICD-10-CM

## 2024-03-05 DIAGNOSIS — K2270 Barrett's esophagus without dysplasia: Secondary | ICD-10-CM

## 2024-03-05 DIAGNOSIS — K219 Gastro-esophageal reflux disease without esophagitis: Secondary | ICD-10-CM

## 2024-03-05 MED ORDER — OMEPRAZOLE 40 MG PO CPDR: 40.0000 mg | DELAYED_RELEASE_CAPSULE | Freq: Two times a day (BID) | ORAL | 1 refills | Status: DC

## 2024-03-05 NOTE — Patient Instructions (Signed)

## 2024-03-05 NOTE — Progress Notes (Signed)
 Subjective:     Patient ID: Adrian Burke, male    DOB: 25-Dec-1940, 83 y.o.   MRN: [id number]  Chief Complaint  Patient presents with   Medical Management of Chronic Issues    6 month follow-up Not fasting     HPI Hld predm, B12, barretts Discussed the use of AI scribe software for clinical note transcription with the patient, who gave verbal consent to proceed.  History of Present Illness Adrian Burke is an 83 year old male with Barrett's esophagus and GERD who presents for routine follow-up.  He has a history of Barrett's esophagus and gastroesophageal reflux disease (GERD) for which he is taking omeprazole  40 mg twice daily. The medication is effective, but he notices a difference if he forgets to take a dose. No dysphagia/bleeding  He is on rosuvastatin  10 mg daily for hyperlipidemia, which he has been taking as half a pill. He has been advised to switch to a 5 mg dose to avoid splitting the pill. LDL at goal  He has an albuterol  inhaler prescribed for asthma-like symptoms, which he attributes to GERD-related choking episodes. He has never used the inhaler.  He reports significant weight loss since 2023, from 220 pounds to 180 pounds, attributed to difficulties with his bottom dental plates that limit his eating. He does better with soup and has lost 3 pounds since February,  He mentions experiencing back pain previously, but it is well-managed with 'Blue Emu' and krill pills, which have also helped with leg and foot swelling. No current pain in his legs or back.  He recalls being told he had prediabetes in 2018, but his blood work has been normal since then. He feels fine and is not concerned about his blood sugar levels.  No chest pain, heart racing, coughing, or shortness of breath.    Health Maintenance Due  Topic Date Due   INFLUENZA VACCINE  02/15/2024    Past Medical History:  Diagnosis Date   Anemia    Anxiety    Arthritis    B12 deficiency    on  monthly injections   Barrett's esophagus 03/2012   Cataract    Chest pain    2006 and 2011 ( neg nuclear stress test 2004) MI R/Oed   Chest pain at rest 3/28-29/14   Diverticulosis    Elevated homocysteine    Elevated PSA    due to prostatis   GERD (gastroesophageal reflux disease)    Headache(784.0)    spinal tap as OP    History of kidney stones    Hyperlipidemia    NMR panel 2005, LDL 125 (1309/534), HDL 38, TG 87. LDL Goal=<130. [place] study LDL Goald=<160.    Prostate nodule 2000   resolved w/o treatement; Dr Watt   Tubular adenoma of colon 06/2006   Dr Jakie   Vertigo 03/10/2018   Patient went to ED for vertigo    Past Surgical History:  Procedure Laterality Date   ABDOMINAL HERNIA REPAIR  11/10/2010   mesh   CATARACT EXTRACTION, BILATERAL     COLONOSCOPY W/ POLYPECTOMY  2001,2007,2013   F/U every 5 years; Dr Jakie   KNEE ARTHROSCOPY Left 09/22/2010   KNEE ARTHROSCOPY Right 01/02/2011   PROSTATE BIOPSY  2000   Dr.Wrenn   Rectal Fistula repair  07/2004   REPLACEMENT TOTAL KNEE Right 02/10/2011   ROTATOR CUFF REPAIR Right 2007   Dr.Jeff Beane   UPPER GI ENDOSCOPY  2013   S/P dilation  Current Outpatient Medications:    acetaminophen  (TYLENOL ) 650 MG CR tablet, Take 1,300 mg by mouth 2 (two) times daily as needed for pain., Disp: , Rfl:    albuterol  (VENTOLIN  HFA) 108 (90 Base) MCG/ACT inhaler, Inhale 2 puffs into the lungs every 6 (six) hours as needed for wheezing or shortness of breath., Disp: 1 each, Rfl: 11   Cholecalciferol (VITAMIN D3) 1000 UNITS CAPS, Take 1,000 Units by mouth every morning. , Disp: , Rfl:    Cyanocobalamin  (B-12 PO), Take by mouth., Disp: , Rfl:    diclofenac Sodium (VOLTAREN) 1 % GEL, Apply topically., Disp: , Rfl:    lidocaine (LIDODERM) 5 %, Place onto the skin., Disp: , Rfl:    Multiple Vitamin (MULTIVITAMIN) capsule, Take 1 capsule by mouth daily., Disp: , Rfl:    rosuvastatin  (CRESTOR ) 10 MG tablet, Take 1 tablet by mouth  once daily, Disp: 90 tablet, Rfl: 0   Zinc 50 MG CAPS, Take by mouth., Disp: , Rfl:    omeprazole  (PRILOSEC) 40 MG capsule, Take 1 capsule (40 mg total) by mouth 2 (two) times daily., Disp: 180 capsule, Rfl: 1  No Known Allergies ROS neg/noncontributory except as noted HPI/below      Objective:     BP 127/75   Pulse 66   Temp (!) 97.5 F (36.4 C) (Temporal)   Resp 18   Ht 5' 8 (1.727 m)   Wt 180 lb 2 oz (81.7 kg)   SpO2 98%   BMI 27.39 kg/m  Wt Readings from Last 3 Encounters:  03/05/24 180 lb 2 oz (81.7 kg)  09/06/23 183 lb (83 kg)  02/22/22 210 lb (95.3 kg)    Physical Exam   Gen: WDWN NAD HEENT: NCAT, conjunctiva not injected, sclera nonicteric NECK:  supple, no thyromegaly, no nodes, no carotid bruits CARDIAC: RRR, S1S2+, no murmur. DP 2+B LUNGS: CTAB. No wheezes ABDOMEN:  BS+, soft, NTND, No HSM, no masses EXT:  no edema MSK: no gross abnormalities.  NEURO: A&O x3.  CN II-XII intact.  PSYCH: normal mood. Good eye contact     Assessment & Plan:  Pure hypercholesterolemia  Barrett's esophagus without dysplasia -     Omeprazole ; Take 1 capsule (40 mg total) by mouth 2 (two) times daily.  Dispense: 180 capsule; Refill: 1  Gastroesophageal reflux disease, unspecified whether esophagitis present -     Omeprazole ; Take 1 capsule (40 mg total) by mouth 2 (two) times daily.  Dispense: 180 capsule; Refill: 1  Prediabetes  Assessment and Plan Assessment & Plan Barrett's esophagus with gastroesophageal reflux disease (GERD)   Barrett's esophagus with GERD is well-controlled with omeprazole  40 mg twice daily. Symptoms recur if a dose is missed, confirming effective management with the current regimen. Refill omeprazole  40 mg for 90 days.  Hyperlipidemia   Hyperlipidemia is managed with rosuvastatin  10 mg daily, currently taking half a pill. The pharmacist suggested switching to 5 mg tablets to avoid splitting pills. Change rosuvastatin  prescription to 5 mg  tablets.  Intentional weight loss related to dental plates   Weight loss is attributed to difficulty eating due to dental plates. Weight has decreased from 220 lbs to 180 lbs since 2023. No concerns about unintentional weight loss.  Edema of lower extremities, improved   Edema of lower extremities has improved with krill oil supplements. Minimal swelling is observed.  Chronic back and leg pain, improved   Chronic back and leg pain is well-managed with the current treatment regimen, including Blue Indu. No  pain is reported in legs or back.  Prediabetes, resolved   Prediabetes diagnosed in 2018 has resolved. Recent blood work in February was normal, indicating no current concerns.    Return in about 6 months (around 09/05/2024) for chronic follow-up.  Jenkins CHRISTELLA Carrel, MD

## 2024-05-16 ENCOUNTER — Other Ambulatory Visit: Payer: Self-pay | Admitting: Family Medicine

## 2024-05-16 ENCOUNTER — Other Ambulatory Visit: Payer: Self-pay

## 2024-05-16 DIAGNOSIS — E7800 Pure hypercholesterolemia, unspecified: Secondary | ICD-10-CM

## 2024-05-16 MED ORDER — ROSUVASTATIN CALCIUM 10 MG PO TABS
10.0000 mg | ORAL_TABLET | Freq: Every day | ORAL | Status: DC
Start: 2024-05-16 — End: 2024-08-14

## 2024-05-16 NOTE — Telephone Encounter (Signed)
 Refill sent in

## 2024-05-20 ENCOUNTER — Other Ambulatory Visit: Payer: Self-pay | Admitting: Family Medicine

## 2024-05-20 DIAGNOSIS — E7800 Pure hypercholesterolemia, unspecified: Secondary | ICD-10-CM

## 2024-07-17 ENCOUNTER — Other Ambulatory Visit: Payer: Self-pay

## 2024-07-17 ENCOUNTER — Emergency Department (HOSPITAL_BASED_OUTPATIENT_CLINIC_OR_DEPARTMENT_OTHER): Admitting: Radiology

## 2024-07-17 ENCOUNTER — Emergency Department (HOSPITAL_BASED_OUTPATIENT_CLINIC_OR_DEPARTMENT_OTHER): Admission: EM | Admit: 2024-07-17 | Discharge: 2024-07-17 | Disposition: A | Discharge status: Home / Self Care

## 2024-07-17 ENCOUNTER — Encounter (HOSPITAL_BASED_OUTPATIENT_CLINIC_OR_DEPARTMENT_OTHER): Payer: Self-pay

## 2024-07-17 DIAGNOSIS — S39012A Strain of muscle, fascia and tendon of lower back, initial encounter: Secondary | ICD-10-CM | POA: Insufficient documentation

## 2024-07-17 DIAGNOSIS — S46912A Strain of unspecified muscle, fascia and tendon at shoulder and upper arm level, left arm, initial encounter: Secondary | ICD-10-CM | POA: Insufficient documentation

## 2024-07-17 DIAGNOSIS — W01198A Fall on same level from slipping, tripping and stumbling with subsequent striking against other object, initial encounter: Secondary | ICD-10-CM | POA: Diagnosis not present

## 2024-07-17 DIAGNOSIS — Y9301 Activity, walking, marching and hiking: Secondary | ICD-10-CM | POA: Insufficient documentation

## 2024-07-17 DIAGNOSIS — Z79899 Other long term (current) drug therapy: Secondary | ICD-10-CM | POA: Insufficient documentation

## 2024-07-17 DIAGNOSIS — S4992XA Unspecified injury of left shoulder and upper arm, initial encounter: Secondary | ICD-10-CM | POA: Diagnosis present

## 2024-07-17 MED ORDER — LIDOCAINE 5 % EX PTCH
1.0000 | MEDICATED_PATCH | CUTANEOUS | Status: DC
  Administered 2024-07-17: 1 via TRANSDERMAL
  Filled 2024-07-17: qty 1

## 2024-07-17 NOTE — ED Triage Notes (Addendum)
 Patient fell yesterday onto left side. Reports he cannot adduct his left arm. Reports walking with a cane despite not usually using one as he says he has pressure on his back.

## 2024-07-17 NOTE — ED Provider Notes (Signed)
 " [place] [facility] AT [facility] Provider Note   CSN: [id number] Arrival date & time: 07/17/24  1433     Patient presents with: Fall and Arm Injury   Adrian Burke is a 84 y.o. male with history of hyperlipidemia, Barrett's esophagus, presents with concern for a mechanical fall that occurred yesterday.  Reports he was hanging up pitchers when he excellently tripped over the rug that was on his floor, causing him to hit his left shoulder against the couch that was there.  He did not hit his head or lose consciousness.  He reports pain to the left shoulder and difficulty with range of motion of the left shoulder.  Denies any paresthesias in the upper extremities bilaterally.  He also reports some right lower back pain that seem to come on gradually after this incident.  Denies any paresthesias in the lower extremities or difficulties with ambulation.  He took ibuprofen prior to arrival for pain.    Fall  Arm Injury      Prior to Admission medications  Medication Sig Start Date End Date Taking? Authorizing Provider  acetaminophen  (TYLENOL ) 650 MG CR tablet Take 1,300 mg by mouth 2 (two) times daily as needed for pain.    [provider]  albuterol  (VENTOLIN  HFA) 108 (90 Base) MCG/ACT inhaler Inhale 2 puffs into the lungs every 6 (six) hours as needed for wheezing or shortness of breath. 10/21/20   Norleen Lynwood ORN, MD  Cholecalciferol (VITAMIN D3) 1000 UNITS CAPS Take 1,000 Units by mouth every morning.     [provider]  Cyanocobalamin  (B-12 PO) Take by mouth.    [provider]  diclofenac Sodium (VOLTAREN) 1 % GEL Apply topically. 06/11/23   [provider]  lidocaine  (LIDODERM ) 5 % Place onto the skin. 06/11/23   [provider]  Multiple Vitamin (MULTIVITAMIN) capsule Take 1 capsule by mouth daily.    [provider]  omeprazole  (PRILOSEC) 40 MG capsule Take 1 capsule (40 mg total) by mouth 2 (two) times  daily. 03/05/24   Wendolyn Jenkins Jansky, MD  rosuvastatin  (CRESTOR ) 10 MG tablet Take 1 tablet by mouth daily 05/20/24   Wendolyn Jenkins Jansky, MD  Zinc 50 MG CAPS Take by mouth.    [provider]    Allergies: Patient has no known allergies.    Review of Systems  Musculoskeletal:        Left shoulder pain    Updated Vital Signs BP (!) 152/96 (BP Location: Right Arm)   Pulse 65   Temp 98.2 F (36.8 C) (Oral)   Resp 15   SpO2 94%   Physical Exam Vitals and nursing note reviewed.  Constitutional:      Appearance: Normal appearance.  HENT:     Head: Atraumatic.  Cardiovascular:     Comments: Radial pulse 2+ bilaterally Pulmonary:     Effort: Pulmonary effort is normal.  Musculoskeletal:     Comments: Left upper extremity:  General No obvious deformity. No erythema, edema, contusions, open wounds   Palpation Tender over the long head of the biceps muscle. Non-tender to palpation along the clavicle, AC joint, glenohumeral joint, humeral head, or distal humerus. Non-tender to palpation over the scapular ridge.  Nontender over the radius, ulna.  Nontender over the carpal bones diffusely, 1st-5th metacarpals or phalanges  No tenderness to the cervical, thoracic, or lumbar spinal processes  Tender to palpation over the right lumbar musculature  ROM Able to fully flex and extend  at the left elbow and left wrist Difficulty with Abduction of the left shoulder.  Able to perform slight internal/external rotation, decreased but intact flexion and extension of left shoulder, able to abduct the left shoulder.  Ambulates with mild antalgic gait  Special tests Negative drop arm sign  Sensation: Sensation intact throughout the upper extremities bilaterally  Strength: 4/5 strength with supraspinatus, infraspinatus, subscapularis testing 5/5 strength with resisted elbow flexion and extension bilaterally  5/5 strength resisted hip flexion extension bilaterally, knee flexion  extension bilaterally, ankle plantarflexion and dorsiflexion bilaterally    Neurological:     General: No focal deficit present.     Mental Status: He is alert.  Psychiatric:        Mood and Affect: Mood normal.        Behavior: Behavior normal.     (all labs ordered are listed, but only abnormal results are displayed) Labs Reviewed - No data to display  EKG: None  Radiology: DG Lumbar Spine Complete Result Date: 07/17/2024 CLINICAL DATA:  Fall and low back pain. EXAM: LUMBAR SPINE - COMPLETE 4+ VIEW COMPARISON:  Lumbar spine radiograph dated 04/05/2004. FINDINGS: Five lumbar type vertebra. No acute fracture or subluxation of the lumbar spine. The bones are osteopenic. Multilevel degenerative changes and disc space narrowing and endplate irregularity/straining. Multilevel facet arthropathy. The visualized posterior elements are intact. The soft tissues are unremarkable. IMPRESSION: 1. No acute fracture or subluxation of the lumbar spine. 2. Multilevel degenerative changes. Electronically Signed   By: Vanetta Chou M.D.   On: 07/17/2024 15:55   DG Shoulder Left Result Date: 07/17/2024 CLINICAL DATA:  Fall and trauma to the left upper extremity. EXAM: LEFT ELBOW - COMPLETE 3+ VIEW; LEFT SHOULDER - 2+ VIEW COMPARISON:  None Available. FINDINGS: No acute fracture or dislocation. The bones are osteopenic. No joint effusion. The soft tissues are unremarkable. IMPRESSION: 1. No acute fracture or dislocation. 2. Osteopenia. Electronically Signed   By: Vanetta Chou M.D.   On: 07/17/2024 15:53   DG Elbow Complete Left Result Date: 07/17/2024 CLINICAL DATA:  Fall and trauma to the left upper extremity. EXAM: LEFT ELBOW - COMPLETE 3+ VIEW; LEFT SHOULDER - 2+ VIEW COMPARISON:  None Available. FINDINGS: No acute fracture or dislocation. The bones are osteopenic. No joint effusion. The soft tissues are unremarkable. IMPRESSION: 1. No acute fracture or dislocation. 2. Osteopenia. Electronically Signed    By: Vanetta Chou M.D.   On: 07/17/2024 15:53     Procedures   Medications Ordered in the ED  lidocaine  (LIDODERM ) 5 % 1 patch (1 patch Transdermal Patch Applied 07/17/24 1547)                                    Medical Decision Making Amount and/or Complexity of Data Reviewed Radiology: ordered.  Risk Prescription drug management.    Differential diagnosis includes but is not limited to fracture, dislocation, sprain, strain, contusion, laceration, nerve injury, vascular injury, compartment syndrome  ED Course:  Upon initial evaluation, patient is well-appearing, no acute distress.  Patient has good story for mechanical fall that occurred yesterday.  He did not hit his head or lose consciousness, no tenderness over the cervical, thoracic, or lumbar spine, no indication for CT head or cervical spine at this time.  Does report some pain to the right lower back, and he is tender over the lumbar musculature of the right lower back.  However,  given age, will obtain x-ray of the lumbar spine to rule out transverse process injury. He is most tender over the long head of the biceps muscle.  He does not have any point tenderness over the clavicle, AC joint, glenohumeral joint, humerus, radius, ulna.  Reports most of his pain is in the anterior aspect of his left shoulder.  He is unable to fully abduct left shoulder, but able to hold arm in place when I raise the arm. Low concern for full tendon rupture. Able to adduct left shoulder.  Has decreased, but intact, flexion, extension, internal rotation, and external rotation of the left shoulder.  He has full flexion extension at the left elbow and left wrist.  He is neurovascularly intact in the bilateral upper extremities  Imaging Studies ordered: I ordered imaging studies including x-ray left shoulder, left elbow, lumbar spine I independently visualized the imaging with scope of interpretation limited to determining acute life threatening  conditions related to emergency care. Imaging showed no acute abnormalities I agree with the radiologist interpretation  Medications Given: Lidocaine  patch  Imaging was reviewed which revealed no acute abnormalities.  Some mild degenerative changes were noted in the lumbar spine and left shoulder, but this does not correspond to his area of acute pain.  Given no bony tenderness, doubt occult fracture.  He is most tender in the long head of the biceps tendon, suspect he strained this during his fall as the cause of his pain.  He has some reproducible tenderness of the right lumbar musculature, suspect he also strained his area during his fall.  No lumbar tenderness to palpation or abnormalities on lumbar x-ray.  Will have him follow-up closely with orthopedics, he request to follow-up with [facility].  He has been provided their information.  He states that pain has improved with the lidocaine  patch.  I offered to prescribe him more patches, but he states he has some already at home that he can use.  Patient stable and appropriate for discharge home.     Impression: Left shoulder strain Lumbar strain  Disposition:  Patient discharged home with instructions to take Tylenol  and ibuprofen as needed for pain.  Lidocaine  patches as needed for pain.  Schedule a follow-up appointment with orthopedics within the next week for further management.  He was provided a shoulder sling to use for comfort as needed.  Return precautions given and patient verbalized understanding.   This chart was dictated using voice recognition software, Dragon. Despite the best efforts of this provider to proofread and correct errors, errors may still occur which can change documentation meaning.       Final diagnoses:  Left shoulder strain, initial encounter    ED Discharge Orders     None          Veta Palma, PA-C 07/17/24 1615    Kammerer, Megan L, DO 07/20/24 1401  "

## 2024-07-17 NOTE — ED Notes (Signed)
 Patient transported to x-ray. ?

## 2024-07-17 NOTE — Discharge Instructions (Signed)
 The x-rays of your left shoulder, left elbow, and lower back do not show any acute injuries today.  You have some degenerative changes noted your x-rays, but this should not be causing the pain you are experiencing.  It seems that you have strained your left shoulder.  You may wear the shoulder sling provided as needed for comfort.  Avoid movements and activities that are painful in the first couple of days after the injury. Elevate the area of injury if able to help limit swelling. Apply ice to the area for the first 24 hours, then switch to heat.   You may take up to 1000mg  of tylenol  every 6 hours as needed for pain. Do not take more then 4g per day.   You may use up to 600mg  ibuprofen every 6 hours as needed for pain.  Do not exceed 2.4g of ibuprofen per day.  You may use your home lidocaine patches on the area as needed for pain.  Please schedule follow-up appointment with the orthopedic provider listed below within the next week for further management.  Return to the ER for any numbness, uncontrolled pain, any other new or concerning symptoms

## 2024-08-22 ENCOUNTER — Other Ambulatory Visit: Payer: Self-pay | Admitting: Family Medicine

## 2024-08-22 DIAGNOSIS — K2270 Barrett's esophagus without dysplasia: Secondary | ICD-10-CM

## 2024-08-22 DIAGNOSIS — K219 Gastro-esophageal reflux disease without esophagitis: Secondary | ICD-10-CM

## 2024-09-05 ENCOUNTER — Ambulatory Visit: Admitting: Family Medicine
